# Patient Record
Sex: Female | Born: 1935 | Race: White | Hispanic: No | Marital: Married | Smoking: Never smoker
Health system: Southern US, Community
[De-identification: ages and names within clinical notes are randomized; demographics above are authoritative.]

## PROBLEM LIST (undated history)

## (undated) DIAGNOSIS — I351 Nonrheumatic aortic (valve) insufficiency: Secondary | ICD-10-CM

## (undated) DIAGNOSIS — R05 Cough: Secondary | ICD-10-CM

## (undated) DIAGNOSIS — D649 Anemia, unspecified: Secondary | ICD-10-CM

## (undated) DIAGNOSIS — G971 Other reaction to spinal and lumbar puncture: Secondary | ICD-10-CM

## (undated) DIAGNOSIS — R011 Cardiac murmur, unspecified: Secondary | ICD-10-CM

## (undated) DIAGNOSIS — C50511 Malignant neoplasm of lower-outer quadrant of right female breast: Secondary | ICD-10-CM

## (undated) DIAGNOSIS — I10 Essential (primary) hypertension: Secondary | ICD-10-CM

## (undated) DIAGNOSIS — R059 Cough, unspecified: Secondary | ICD-10-CM

## (undated) HISTORY — PX: HERNIA REPAIR: SHX51

## (undated) HISTORY — PX: BREAST SURGERY: SHX581

## (undated) HISTORY — DX: Malignant neoplasm of lower-outer quadrant of right female breast: C50.511

## (undated) HISTORY — DX: Nonrheumatic aortic (valve) insufficiency: I35.1

## (undated) HISTORY — PX: ABDOMINAL HYSTERECTOMY: SHX81

## (undated) HISTORY — PX: APPENDECTOMY: SHX54

## (undated) HISTORY — PX: TONSILLECTOMY: SUR1361

---

## 1977-12-17 HISTORY — PX: MASTECTOMY: SHX3

## 1999-11-07 ENCOUNTER — Ambulatory Visit (HOSPITAL_BASED_OUTPATIENT_CLINIC_OR_DEPARTMENT_OTHER): Admission: RE | Admit: 1999-11-07 | Discharge: 1999-11-07 | Payer: Self-pay

## 2000-01-15 ENCOUNTER — Ambulatory Visit (HOSPITAL_COMMUNITY): Admission: RE | Admit: 2000-01-15 | Discharge: 2000-01-15 | Payer: Self-pay

## 2000-01-15 ENCOUNTER — Encounter (INDEPENDENT_AMBULATORY_CARE_PROVIDER_SITE_OTHER): Payer: Self-pay | Admitting: *Deleted

## 2000-06-05 ENCOUNTER — Encounter (INDEPENDENT_AMBULATORY_CARE_PROVIDER_SITE_OTHER): Payer: Self-pay

## 2000-06-05 ENCOUNTER — Other Ambulatory Visit: Admission: RE | Admit: 2000-06-05 | Discharge: 2000-06-05 | Payer: Self-pay | Admitting: *Deleted

## 2004-06-15 ENCOUNTER — Other Ambulatory Visit: Admission: RE | Admit: 2004-06-15 | Discharge: 2004-06-15 | Payer: Self-pay | Admitting: Obstetrics and Gynecology

## 2004-08-04 ENCOUNTER — Encounter (INDEPENDENT_AMBULATORY_CARE_PROVIDER_SITE_OTHER): Payer: Self-pay | Admitting: *Deleted

## 2004-08-04 ENCOUNTER — Ambulatory Visit (HOSPITAL_COMMUNITY): Admission: RE | Admit: 2004-08-04 | Discharge: 2004-08-04 | Payer: Self-pay | Admitting: Obstetrics and Gynecology

## 2005-05-28 ENCOUNTER — Encounter (INDEPENDENT_AMBULATORY_CARE_PROVIDER_SITE_OTHER): Payer: Self-pay | Admitting: Specialist

## 2005-05-28 ENCOUNTER — Encounter (INDEPENDENT_AMBULATORY_CARE_PROVIDER_SITE_OTHER): Payer: Self-pay | Admitting: *Deleted

## 2005-05-29 ENCOUNTER — Inpatient Hospital Stay (HOSPITAL_COMMUNITY): Admission: RE | Admit: 2005-05-29 | Discharge: 2005-05-31 | Payer: Self-pay | Admitting: Obstetrics and Gynecology

## 2006-03-05 ENCOUNTER — Ambulatory Visit: Payer: Self-pay | Admitting: Internal Medicine

## 2006-03-12 ENCOUNTER — Ambulatory Visit (HOSPITAL_COMMUNITY): Admission: RE | Admit: 2006-03-12 | Discharge: 2006-03-12 | Payer: Self-pay | Admitting: Internal Medicine

## 2006-03-13 ENCOUNTER — Ambulatory Visit (HOSPITAL_COMMUNITY): Admission: RE | Admit: 2006-03-13 | Discharge: 2006-03-13 | Payer: Self-pay | Admitting: Internal Medicine

## 2006-04-02 ENCOUNTER — Ambulatory Visit: Payer: Self-pay | Admitting: Internal Medicine

## 2006-04-30 ENCOUNTER — Ambulatory Visit: Payer: Self-pay | Admitting: Internal Medicine

## 2006-10-29 ENCOUNTER — Ambulatory Visit: Payer: Self-pay | Admitting: Internal Medicine

## 2007-11-04 ENCOUNTER — Ambulatory Visit: Payer: Self-pay | Admitting: Internal Medicine

## 2008-01-01 ENCOUNTER — Ambulatory Visit: Payer: Self-pay | Admitting: Internal Medicine

## 2008-01-01 DIAGNOSIS — R059 Cough, unspecified: Secondary | ICD-10-CM | POA: Insufficient documentation

## 2008-01-01 DIAGNOSIS — R05 Cough: Secondary | ICD-10-CM

## 2008-01-02 DIAGNOSIS — J310 Chronic rhinitis: Secondary | ICD-10-CM | POA: Insufficient documentation

## 2008-01-02 DIAGNOSIS — M069 Rheumatoid arthritis, unspecified: Secondary | ICD-10-CM | POA: Insufficient documentation

## 2008-01-02 DIAGNOSIS — J449 Chronic obstructive pulmonary disease, unspecified: Secondary | ICD-10-CM

## 2008-01-02 DIAGNOSIS — J4489 Other specified chronic obstructive pulmonary disease: Secondary | ICD-10-CM | POA: Insufficient documentation

## 2008-01-02 DIAGNOSIS — J40 Bronchitis, not specified as acute or chronic: Secondary | ICD-10-CM

## 2008-04-10 ENCOUNTER — Encounter: Payer: Self-pay | Admitting: Internal Medicine

## 2008-04-19 ENCOUNTER — Ambulatory Visit: Payer: Self-pay | Admitting: Internal Medicine

## 2009-02-25 ENCOUNTER — Ambulatory Visit (HOSPITAL_COMMUNITY): Admission: RE | Admit: 2009-02-25 | Discharge: 2009-02-25 | Payer: Self-pay | Admitting: Endocrinology

## 2009-08-31 ENCOUNTER — Telehealth (INDEPENDENT_AMBULATORY_CARE_PROVIDER_SITE_OTHER): Payer: Self-pay | Admitting: *Deleted

## 2009-09-22 ENCOUNTER — Ambulatory Visit: Payer: Self-pay | Admitting: Internal Medicine

## 2009-09-23 ENCOUNTER — Telehealth: Payer: Self-pay | Admitting: Internal Medicine

## 2010-09-22 ENCOUNTER — Telehealth: Payer: Self-pay | Admitting: Internal Medicine

## 2011-01-18 NOTE — Progress Notes (Signed)
Summary: nos appt  Phone Note Call from Patient   Caller: [email or web address]  Call For: young Summary of Call: LMTCB x2 to rsc nos from 10/6. Initial call taken by: Darletta Moll,  September 22, 2010 3:54 PM

## 2011-02-28 ENCOUNTER — Ambulatory Visit (HOSPITAL_COMMUNITY): Payer: Medicare Other | Attending: Endocrinology

## 2011-02-28 DIAGNOSIS — M810 Age-related osteoporosis without current pathological fracture: Secondary | ICD-10-CM | POA: Insufficient documentation

## 2011-05-01 NOTE — Assessment & Plan Note (Signed)
[facility]                             PULMONARY OFFICE NOTE   Sarah Lewis, Sarah Lewis                       MRN:          [id number]  DATE:11/04/2007                            DOB:          10-19-1936    PROBLEM LIST:  1. Chronic obstructive pulmonary disease.  2. History of mastectomy.  3. Cough.   HISTORY:  She says breathing has been very good.  She is using Tussionex  1/2 teaspoon once a day most days.  Spiriva did not help.  Tussionex  does stop her chronic cough which is nonseasonal.  She thinks the  primary problem is runny nose with watery rhinorrhea and that this is  the cause for her cough.  Throat lozenge is occasionally helpful.  Claritin and Zyrtec did not help either her nose or her cough.  She  never coughs at night, denies any reflux symptoms.  Nothing purulent or  bloody.  No headache.  She has had flu vaccine.   MEDICATIONS:  1. Forteo.  2. Norvasc.  3. Tussionex.   ALLERGIES:  No known drug allergies.   OBJECTIVE:  VITAL SIGNS:  Weight 101 pounds, blood pressure 142/80,  pulse 87, room air saturation 96%.  HEENT:  Nasal airway looks clear.  Pharynx clear.  CHEST:  Clear.  No cough at all while she was with me today.  HEART:  Sounds regular without murmur.   IMPRESSION:  Cough she relates to watery rhinorrhea.  I think there is a  background component of asthma/chronic obstructive pulmonary disease as  well, but pulmonary function tests previously had shown only mild  obstruction with some evidence of air trapping and moderately reduced  diffusion.   PLAN:  1. We refilled Tussionex with discussion.  2. Try Patanase nasal spray once each nostril b.i.d. p.r.n.  3. Return for allergy testing, which she requests.    Clinton D. Maple Hudson, MD, Tonny Bollman, FACP  Electronically Signed   CDY/MedQ  DD: 11/05/2007  DT: 11/06/2007  Job #: [id number]   cc:   Dario Guardian, M.D.

## 2011-05-04 NOTE — Op Note (Signed)
NAME:  Sarah Lewis, Sarah Lewis              ACCOUNT NO.:  [id number]   MEDICAL RECORD NO.:  [id number]          PATIENT TYPE:  AMB   LOCATION:  SDC                           FACILITY:  [facility]   PHYSICIAN:  Guy Sandifer. Henderson Cloud, M.D. DATE OF BIRTH:  21-Jul-1936   DATE OF PROCEDURE:  05/28/2005  DATE OF DISCHARGE:                                 OPERATIVE REPORT   PREOPERATIVE DIAGNOSES:  1.  Endometrial hyperplasia.  2.  Uterine leiomyomata.   POSTOPERATIVE DIAGNOSES:  1.  Endometrial  hyperplasia.  2.  Left ovarian tumor.   PROCEDURES:  1.  Total abdominal hysterectomy.  2.  Bilateral salpingo-oophorectomy.  3.  Laparoscopy.   SURGEON:  Guy Sandifer. Henderson Cloud, M.D.   ASSISTANTFreddy Finner, M.D.   ESTIMATED BLOOD LOSS:  100 mL.   ANESTHESIA:  General with endotracheal intubation.   SPECIMENS:  1.  Uterus and bilateral tubes and ovaries.  2.  Peritoneal washings for cytology.   INDICATIONS AND CONSENT:  This patient is a 75 year old, married, white  female, G2, P2, with a history of breast cancer, who was found to have  endometrial hyperplasia without atypia on hysteroscopy and D&C.  After  discussion, the patient wishes to proceed to hysterectomy.  The details are  dictated in the history and physical.  The patient has also been noted to  have a pedunculated uterine leiomyoma measuring approximately 7 cm on  ultrasound.  Laparoscopically assisted vaginal hysterectomy has been  discussed with the patient preoperatively.  Removal of both ovaries as well.  The potential risks and complications have been discussed preoperatively,  including, but not limited to infection, bowel, bladder and ureteral damage,  bleeding requiring transfusion of blood products, possible transfusion  reaction, HIV and hepatitis acquisition, DVT, PE, pneumonia, laparotomy and  fistula formation.  All questions are answered and consent is signed on the  chart.   FINDINGS:  At laparoscopy, the upper abdomen is  without lesion.  There is no  studding or adhesions noted throughout the abdominal cavity.  In the pelvis,  the uterus is normal size.  The anterior and posterior cul-de-sacs are  normal.  The right tube and ovary are normal.  The left ovary has a firm,  smooth, totally mobile, white mass approximately 7 cm in greatest dimension.   DESCRIPTION OF PROCEDURE:  The patient was taken to the operating room where  she was identified, placed in the supine position and general anesthesia was  induced via endotracheal intubation.  She was then placed in the  dorsolithotomy position where she was prepped abdominally and vaginally.  The bladder was straight catheterized.  A Hulka tenaculum in the uterus as a  manipulation.  She was draped in a sterile fashion.  A small infraumbilical  incision was made by elevating the skin with Allis clamps.  A disposable  Veress was then placed on the first attempt with normal syringe and drop  test.  Two liters of gas were insufflated under low pressure.  Good tympani  in the right upper quadrant was noted.  The Veress was removed and was  replaced with a disposable, nonbladed 10/11 trocar sleeve.  Inspection  revealed the above findings.  The hysteroscope was withdrawn.  The fascia of  the umbilical incision was closed with a single 3-0 Vicryl suture.  Care was  taken not to pick up any underlying structures.  The umbilical incision was  closed with Dermabond.  The Hulka was removed and a Foley catheter was  placed in the bladder.  The skin was then entered through a Pfannenstiel  incision and dissection was carried out in layers to the peritoneum.  The  peritoneum was incised and extended superiorly and inferiorly.  Washings  were then taken of the peritoneal cavity and sent for cytology.  A Lendell Caprice-  O'Connor retractor was then placed.  The sides of the retractor are padded  with towels to prevent any pressure problems from the blades.  A bladder  blade was  placed, bowel was packed away and a superior blade was placed.  Further inspection confirmed the above findings.  Kelly clamps were placed  in the proximal ligaments on the uterus bilaterally.  The left ovary was  then elevated.  The ureter could be seen well clear of the area of surgery.  The left infundibular pelvic ligament was then clamped and taken down.  The  utero-ovarian ligament was then taken down as well.  The specimen was sent  to pathology for frozen section.  The infundibular pelvic pedicle was then  ligated first with a free tie and then with a suture of 0 Monocryl.  All  sutures will be 0 Monocryl unless otherwise designated.  Progressive bites  were taken of the left broad ligament down to the level of the vesicouterine  peritoneum.  The vesicouterine peritoneum was then taken down sharply and  the bladder flap was advanced.  The left infundibular pelvic ligament was  then isolated.  The ureter seemed to be well clear of the clamp.  It was  then taken down and doubly ligated with a free tie and then with a suture.  This was then taken across the round ligament and down the broad ligament as  well.  The bladder was then safely advanced sharply and bluntly.  Progressive bites of the cardinal ligaments were taken with straight clamps.  Curved clamps were then used to take down the uterosacral ligament near the  vagina bilaterally.  The specimen was cut free.  The entire cervix was seen  to be removed.  The pedicles were ligated with transfixion sutures.  The  cuff was then closed with figure-of-eights.  The uterosacral ligaments were  then plicated in the midline with a single suture placed distal on the  uterosacral ligaments.  Bleeding was controlled at the peritoneal edges with  cautery.  Copious washing was carried out.  Pathology returned consistent with a benign fibroma or possible fibrothecoma.  The anterior peritoneum was  then closed in a running fashion with 0  Monocryl suture, which was also used  to reapproximate the pyramidalis muscle in the midline.  The anterior rectus  fascia was closed with 0 PDS suture starting at each angle and meeting in  the middle.  The skin was closed with clips.  All sponge, needle and  instrument counts were correct.  The patient was transferred to the recovery  room in stable condition.       JET/MEDQ  D:  05/28/2005  T:  05/28/2005  Job:  [id number]

## 2011-05-04 NOTE — Discharge Summary (Signed)
NAME:  Sarah Lewis, Sarah Lewis              ACCOUNT NO.:  [id number]   MEDICAL RECORD NO.:  [id number]          PATIENT TYPE:  INP   LOCATION:  9310                          FACILITY:  [facility]   PHYSICIAN:  Guy Sandifer. Henderson Cloud, M.D. DATE OF BIRTH:  1936-02-16   DATE OF ADMISSION:  05/28/2005  DATE OF DISCHARGE:                                 DISCHARGE SUMMARY   ADMITTING DIAGNOSES:  1.  Endometrial hyperplasia.  2.  Leiomyomata.   DISCHARGE DIAGNOSES:  1.  Endometrial hyperplasia.  2.  Left ovarian tumor.   PROCEDURE:  On May 28, 2005 0 total abdominal hysterectomy with bilateral  salpingo-oophorectomy and laparoscopy.   REASON FOR ADMISSION:  This patient is a 75 year old married white female G2  P2 with a history of breast cancer who has endometrial hyperplasia without  atypia. She also has a history of uterine leiomyomata. She is being admitted  for surgical management. Details are dictated in the history and physical.   HOSPITAL COURSE:  The patient was admitted to hospital, goes to the  operating room for the above procedure. At laparoscopy what was felt to be a  pedunculated fibroid preoperatively is seen to be a firm left ovarian tumor  approximately 7-8 cm in diameter. Her procedure is then converted to total  abdominal hysterectomy with bilateral salpingo-oophorectomy. Frozen section  was consistent with a benign tumor, probable fibroma. On the evening of  surgery she has some nausea controlled with medication and good pain relief.  Vital signs are stable, she is afebrile, with clear urine output. On the  first postoperative day she has had a little bit of nausea off and on and no  flatus. She has good pain relief. Vital signs remained stable and she is  afebrile with clear urine output. Abdomen is soft with positive but  hypoactive bowel sounds. Hemoglobin is 8.2 and white count 10.6. She is  ambulating well. On the second postoperative day she is passing flatus and  tolerated a  regular diet last evening for supper. She has a little bit of  nausea but no vomiting. She is a little weak and dizzy on her feet and  requires assistance with ambulation. Vital signs revealed stable blood  pressures in the 110s over 70s. Pulse at rest is in the 90s and regular.  Abdomen is soft with positive bowel sounds and the incision is okay.  Hemoglobin is 6.4; white count 9.8; platelets 259,000. Although she is  hemodynamically stable at rest with her postoperative anemia she is  symptomatic. Considering the patient's symptoms and age, transfusion is  discussed with the patient. The risks and benefits of transfusion are  reviewed and the patient is transfused 3 units of packed red blood cells.  That same evening she is feeling better in the middle of unit #2 of blood.  She is sitting up in the chair eating. She remains afebrile. Vital signs are  stable. Ancef is resumed IV for prophylaxis. On the day of discharge,  hemoglobin is 10.1 at 6:00 a.m. and is 11.8 at 12 p.m. Her color was good,  she is feeling well,  and is ambulating. Incision continues to heal well.  Staples are removed and instructions are given. Pathology is pending at time  of dictation.   CONDITION ON DISCHARGE:  Good.   DIET:  Regular as tolerated.   ACTIVITY:  No lifting, no operation of automobiles, no vaginal entry. She is  to call the office problems including but not limited to heavy vaginal  bleeding, temperature of 101 degrees, increasing pain, or persistent  nausea/vomiting.   MEDICATIONS:  1.  Ceftin 250 mg p.o. b.i.d. #14.  2.  Darvocet N 100 #31 one to two p.o. q.6h. p.r.n.  3.  Multivitamin daily.  4.  She will resume her preoperative medications that she took at home.   Follow-up is in the office in 2 weeks.       JET/MEDQ  D:  05/31/2005  T:  05/31/2005  Job:  [id number]

## 2011-05-04 NOTE — Op Note (Signed)
[place]. [facility]  Patient:    Sarah Lewis                        MRN: [id number] Proc. Date: 01/15/00 Adm. Date:  [id number] Disc. Date: [id number] Attending:  Gennie Alma CC:         Dario Guardian, M.D.                           Operative Report  PREOPERATIVE DIAGNOSIS:  Mass of right breast.  POSTOPERATIVE DIAGNOSIS:  Mass of right breast.  PROCEDURE:  Right breast biopsy.  SURGEON:  Milus Mallick, M.D.  ANESTHESIA:  Local infiltration with 1% Xylocaine - 25 cc and monitored anesthesia care.  DESCRIPTION OF PROCEDURE:  Under adequate perioperative intravenous sedation, the patients right breast was prepared and draped in usual fashion.  There was a palpable nodule at the 11 oclock radial of the right breast just at the edge of  the areolar.  The area in question was infiltrated with 1% Xylocaine.  A periareolar incision was made and carried down to the lesion.  Was totally excised. Bleeders were electrocoagulated and one incidence, a significant arterial bleeder was suture ligated with 3-0 chromic catgut.  The specimen was sent for routine pathologic examination.  Hemostasis was ascertained.  The subcuticular layer was reapproximated with a continuous suture of 5-0 Vicryl, half inch Steri-Strips were applied to the skin, sterile dressing was applied.  Estimated blood loss for the procedure was approximately 100 cc.  The patient was very oozy due to her chronic ingestion of aspirins.  A good deal of electrocoagulation was used to secure hemostasis.  The patient tolerated the procedure well and left the operating room in satisfactory condition. DD:  01/15/00 TD:  01/15/00 Job: [id number]

## 2011-05-04 NOTE — Op Note (Signed)
[place]. [facility]  Patient:    SAMUELLA RASOOL                        MRN: [id number] Proc. Date: 11/07/99 Adm. Date:  [id number] Attending:  Gennie Alma CC:         Dario Guardian, M.D., [facility]                           Operative Report  PREOPERATIVE DIAGNOSIS:  Chronically incarcerated right femoral hernia.  POSTOPERATIVE DIAGNOSES: 1. Chronically incarcerated right femoral hernia. 2. Enlarged right superficial inguinal lymph node.  PROCEDURES: 1. Repair of right femoral hernia with mesh. 2. Excision of right superficial inguinal lymph node.  SURGEON:  Milus Mallick, M.D.  ANESTHESIA:  General.  DESCRIPTION OF PROCEDURE:  Under adequate general anesthesia, the patients right inguinal and femoral regions were prepared and draped in usual fashion.  An oblique incision was made in the inguinal crease overlying the femoral canal.  There was a palpable mass in the area consistent with a chronically incarcerated right femoral hernia.  Prior to making the incision, area was infiltrated with 1% Xylocaine with 1:200,000 parts of epinephrine, 8 cc.  The incision was made and carried down through Scarpas fascia.  Two large veins were divided over hemostats, which were ligated with 4-0 Vicryl.  Femoral canal was exposed and there was a chronically  incarcerated right femoral hernia.  It was dissected away from the surrounding tissue and was able to be reduced after taking down all of the adhesions associated with it.  It was reduced back into the preperitoneal space.  The defect in the femoral canal admitted a full index finger.  It was repaired with an ______ mesh plug, large in size.  It was held in place with two sutures of 0 Surgilon, one o the inguinal ligament and the other to Coopers ligament.  The mesh was securely  held in place by these few sutures.  In addition to this pathology, there was also a markedly  enlarged right superficial inguinal lymph node.  Because of the patients past history of cancer of the breast, it was felt to be prudent to excise the enlarged lymph node.  This was one over hemostats, which were ligated with 4-0 Vicryl.  The lymph node was approximately 3 cm in length and 2 cm in diameter.  It was sent for routine pathologic study.  Hemostasis is ascertained.  Scarpas fascia was closed with continuous suture of  3-0 Vicryl and the subcuticular layer was reapproximated with a continuous suture of 5-0 Vicryl.  Half-inch Steri-Strips were applied to the skin and sterile dressing was applied.  Estimated blood loss for the procedure was negligible. Patient tolerated the procedure well, left the operating room in satisfactory condition. DD:  11/07/99 TD:  11/08/99 Job: [id number]

## 2011-05-04 NOTE — H&P (Signed)
NAME:  Sarah Lewis, Sarah Lewis              ACCOUNT NO.:  [id number]   MEDICAL RECORD NO.:  [id number]          PATIENT TYPE:  AMB   LOCATION:  SDC                           [facility]ILITY[facility]   PHYSICIAN:  Guy Sandifer. Henderson Cloud, M.D. DATE OF BIRTH:  December 26, 1935   DATE OF ADMISSION:  05/28/2005  DATE OF DISCHARGE:                                HISTORY & PHYSICAL   CHIEF COMPLAINT:  Endometrial hyperplasia.   HISTORY OF PRESENT ILLNESS:  This patient is a 75 year old, married, white  female, G2, P2, who has had breast cancer, who most recently was diagnosed  with endometrial hyperplasia without atypia in both an endometrial polyp as  well as throughout the endometrium upon hysteroscopy D&C in August 2005.  After discussion of the options, consultation was undertaken with  oncologist, Dr. Ruthann Cancer.  He agreed with hysterectomy.  He also  agreed with BSO.  Endometrial biopsy, in January 2006, was benign.  After  discussion of the options, she is being admitted for laparoscopically  assisted vaginal hysterectomy and bilateral salpingo-oophorectomy.  Ultrasound in my office, in July 2005, revealed a pedunculated 7-cm fundal  fibroid.   PAST MEDICAL HISTORY:  1.  Chronic hypertension.  2.  Breast cancer, status post mastectomy.  3.  Osteopenia.   PAST SURGICAL HISTORY:  1.  Mastectomy.  2.  Hysteroscopy D&C as above.  3.  Right inguinal hernia surgery.  4.  Ovarian cystectomy in 1969.  5.  Benign right breast lumpectomy, 2001.   FAMILY HISTORY:  Negative.   OBSTETRIC HISTORY:  Cesarean section x 2.   MEDICATIONS:  Lisinopril daily, Fosamax daily.   No known drug allergies.   SOCIAL HISTORY:  Denies tobacco, alcohol, or drug abuse.   REVIEW OF SYSTEMS:  NEURO:  Denies headache.  CARDIAC:  Denies chest pain.  PULMONARY:  Denies shortness of breath.  GI:  Denies recent changes in bowel  habits.   PHYSICAL EXAMINATION:  VITAL SIGNS:  Blood pressure 126/74.  HEENT:  Without  thyromegaly.  LUNGS:  Clear to auscultation.  HEART:  Regular rate and rhythm.  BACK:  Without CVA tenderness.  BREASTS:  Not examined.  ABDOMEN:  Soft, nontender with a midline mass just palpable above the pubic  symphysis.  PELVIC:  Vulva, vagina, and cervix slight atrophic without lesion.  Uterus  is mobile with a 7- to -8-cm fibroid noted.  Adnexa nontender without  masses.  EXTREMITIES:  Grossly within normal limits.  NEUROLOGIC:  Grossly within normal limits.   ASSESSMENT:  1.  Endometrial hyperplasia.  2.  Uterine leiomyomata.   PLAN:  Laparoscopically-assisted vaginal hysterectomy with bilateral  salpingo-oophorectomy.       JET/MEDQ  D:  05/23/2005  T:  05/23/2005  Job:  [id number]

## 2011-05-04 NOTE — Assessment & Plan Note (Signed)
[facility]                               PULMONARY OFFICE NOTE   Sarah Lewis, Sarah Lewis                     MRN:          [id number]  DATE:10/29/2006                            DOB:          06-28-1936    PROBLEM LIST:  1. Cough.  2. Chronic obstructive pulmonary disease.  3. History of mastectomy.   HISTORY:  Six month followup. She has recently had a cold which she feels  she is getting over. Benzonatate perles have not turned out to be so much  help for her cough but she still considers Spiriva to be a very big benefit.  She denies post nasal drip, wheeze or reflux. Cough varies with no real  pattern. We discussed available cough medicines. She does not want anything  with a narcotic. There has been nothing purulent, no pain and no sudden  events. 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 Has  had flu shot this fall.   MEDICATIONS:  Norvasc, Fosamax, Spiriva, benzonatate perles.   No medication allergies.   OBJECTIVE:  VITAL SIGNS:  Weight 101 pounds, BP 120/62, pulse regular 79,  room air saturation 96%.  CHEST:  Clear now with no wheeze and no cough even on deep breath or forced  exhalation.  HEART:  Sounds are regular and normal.  LYMPH:  No lymphadenopathy.  HEENT:  There is minimal nasal congestion.   IMPRESSION:  1. Incidental recent upper respiratory infection, resolving.  2. Chronic bronchitis is probably the main basis for her cough with chest      x-ray evidence of some chronic obstructive pulmonary disease,      hyperinflation and changes of emphysema in this never smoker. Alpha 180      trypsin level was normal and pulmonary function test had shown mild      obstructive airways change with minimal response to dilator. Diffusion      was moderately reduced at 64% of predicted. She has no close family      members with lung disease.   PLAN:  1. Continue Spiriva.  2. She asked to be followed at long  intervals here and we will see her      again in a year unless      needed sooner.  3. Consider adding additional bronchodilator therapy later if necessary.     Clinton D. Maple Hudson, MD, Tonny Bollman, FACP  Electronically Signed    CDY/MedQ  DD: 10/29/2006  DT: 10/30/2006  Job #: [id number]   cc:   Dario Guardian, M.D.

## 2011-05-04 NOTE — H&P (Signed)
NAME:  Sarah Lewis, Sarah Lewis                        ACCOUNT NO.:  [id number]   MEDICAL RECORD NO.:  [id number]                   PATIENT TYPE:  AMB   LOCATION:  SDC                                  FACILITY:  [facility]   PHYSICIAN:  Guy Sandifer. Arleta Creek, M.D.           DATE OF BIRTH:  [birth year]   DATE OF ADMISSION:  08/04/2004  DATE OF DISCHARGE:                                HISTORY & PHYSICAL   CHIEF COMPLAINT:  Postmenopausal bleeding.   HISTORY OF PRESENT ILLNESS:  This patient is a 75 year old G2, P2, who had a  large prolapsed cervical polyp removed in 2001.  She then presents in June  of this year with her first episode of postmenopausal bleeding since that  time.  She has a known pedunculated fibroid.  Ultrasound with sono-  hysterogram on July 07, 2004 reveals a 7.1 cm pedunculated fundal fibroid  which is  unchanged.  Hysterogram reveals 2 polypoid type endometrial masses measuring  19 and 11 mm respectively.  She is being admitted for hysteroscopy, D&C.  Potential risks, complications have been discussed with the patient  preoperatively.   PAST MEDICAL HISTORY:  1. Osteoporosis.  2. Chronic hypertension.  3. Left breast cancer status post left mastectomy and chemotherapy in 1978.   SURGICAL HISTORY:  Mastectomy as above; hernia surgery; ovarian cyst removal  in the 1960's; and benign breast lumpectomies on the right breast.   MEDICATIONS:  Fosamax and Lisinopril.   ALLERGIES:  No known drug allergies.   SOCIAL HISTORY:  The patient denies tobacco, alcohol or drug abuse.   FAMILY HISTORY:  Breast cancer in sister.   REVIEW OF SYSTEMS:  NEURO:  Denies headache.  CARDIO:  Denies chest pain.  PULMONARY:  Denies shortness of breath.   PHYSICAL EXAMINATION:  LUNGS:  Clear to auscultation.  HEART:  Regular rate and rhythm.  BREASTS:  Not examined.  ABDOMEN:  Soft, nontender, palpable midline mass just above the symphysis  pubis.  PELVIC EXAM:  Vulva, vagina and cervix without  lesion.  A 7 cm anterior  fibroid is noted.  Adnexal exam compromised by fibroid.  EXTREMITIES/NEUROLOGIC EXAM:  Grossly within normal limits.   ASSESSMENT:  Postmenopausal bleeding.   PLAN:  Hysteroscopy, dilation and curettage.                                               Guy Sandifer Arleta Creek, M.D.    JET/MEDQ  D:  08/03/2004  T:  08/03/2004  Job:  [id number]

## 2011-05-04 NOTE — Op Note (Signed)
NAME:  Sarah Lewis, Sarah Lewis                        ACCOUNT NO.:  [id number]   MEDICAL RECORD NO.:  [id number]                   PATIENT TYPE:  AMB   LOCATION:  SDC                                  FACILITY:  [facility]   PHYSICIAN:  Guy Sandifer. Arleta Creek, M.D.           DATE OF BIRTH:  06-09-1936   DATE OF PROCEDURE:  08/04/2004  DATE OF DISCHARGE:                                 OPERATIVE REPORT   PREOPERATIVE DIAGNOSES:  Postmenopausal bleeding.   POSTOPERATIVE DIAGNOSES:  Endometrial polyps.   PROCEDURE:  1. Hysteroscopy with resection of endometrial polyps.  2. Dilatation and curettage.  3. One percent Xylocaine paracervical block.   ESTIMATED BLOOD LOSS:  Less than 50 mL.   I & O'S AND SORBITOL DISTENDING MEDIA:  20 mL deficit.   SPECIMENS:  1. Endometrial polyps.  2. Endometrial curettings.   INDICATIONS FOR PROCEDURE:  The patient is a 75 year old, G2, P2, with  postmenopausal bleeding. Details were dictated in the history and physical.  Hysteroscopy with dilatation and curettage has been discussed with the  patient preoperatively.  The potential risks and complications were  discussed preoperatively including but not limited to infection, uterine  perforation, bowel, bladder, ureteral damage, bleeding requiring  transfusion of blood products and possible transfusion reaction.  HIV and  hepatitis acquisition, DVT, PE, pneumonia, laparoscopy, laparotomy,  recurrent polyps or endometrial bleeding. All questions are answered and  consent is signed on the chart.   FINDINGS:  There are two endometrial polyps measuring 2-3 cm arising from  the upper posterior wall and the upper fundus of the endometrial canal.   DESCRIPTION OF PROCEDURE:  The patient was taken to the operating room where  she was identified, placed in dorsal supine position and intravenous  anesthetic is administered.  She is then placed in the dorsal lithotomy  position and prepped abdominally and vaginally. The  bladder is straight  catheterized. She is draped in a sterile fashion. A bivalve speculum was  placed in the vagina and anterior cervical lip is injected with 1% plain  Xylocaine. This was then grasped with a single tooth tenaculum.  Paracervical block is placed at the 2, 4, 5, 7, 8 and 10 o'clock positions  with approximately 20 mL total of 1% Xylocaine plain.  The cervix is  progressively and gently dilated with a 33 Pratt dilator.  The operative  hysteroscope is placed in the endocervical canal and advanced under direct  visualization using sorbitol distending media. The above findings are noted.  The polyps are removed without difficulty. Sharp curettage is then carried  out.  All instruments are removed, all counts are correct.  The patient is  awakened, taken to the recovery room in stable condition.  Guy Sandifer Arleta Creek, M.D.    JET/MEDQ  D:  08/04/2004  T:  08/05/2004  Job:  [id number]

## 2012-03-03 ENCOUNTER — Encounter (HOSPITAL_COMMUNITY): Payer: Medicare Other

## 2012-03-05 ENCOUNTER — Other Ambulatory Visit (HOSPITAL_COMMUNITY): Payer: Self-pay | Admitting: *Deleted

## 2012-03-10 ENCOUNTER — Encounter (HOSPITAL_COMMUNITY): Payer: Self-pay

## 2012-03-10 ENCOUNTER — Encounter (HOSPITAL_COMMUNITY)
Admission: RE | Admit: 2012-03-10 | Discharge: 2012-03-10 | Disposition: A | Discharge status: Home / Self Care | Payer: Medicare Other | Source: Ambulatory Visit | Attending: Endocrinology | Admitting: Endocrinology

## 2012-03-10 DIAGNOSIS — M810 Age-related osteoporosis without current pathological fracture: Secondary | ICD-10-CM | POA: Insufficient documentation

## 2012-03-10 HISTORY — DX: Cough, unspecified: R05.9

## 2012-03-10 HISTORY — DX: Cough: R05

## 2012-03-10 HISTORY — DX: Essential (primary) hypertension: I10

## 2012-03-10 MED ORDER — ZOLEDRONIC ACID 5 MG/100ML IV SOLN
5.0000 mg | Freq: Once | INTRAVENOUS | Status: AC
  Administered 2012-03-10: 5 mg via INTRAVENOUS
  Filled 2012-03-10: qty 100

## 2012-03-10 MED ORDER — SODIUM CHLORIDE 0.9 % IV SOLN
INTRAVENOUS | Status: AC
  Administered 2012-03-10: 250 mL via INTRAVENOUS

## 2012-03-10 NOTE — Discharge Instructions (Signed)
Drink fluids/water as tolerated over next 72hrs Tylenol or Ibuprofen OTC as directed Continue calcium and Vit D as directed by your MD 

## 2013-03-02 ENCOUNTER — Other Ambulatory Visit (HOSPITAL_COMMUNITY): Payer: Self-pay | Admitting: *Deleted

## 2013-03-03 ENCOUNTER — Encounter (HOSPITAL_COMMUNITY)
Admission: RE | Admit: 2013-03-03 | Discharge: 2013-03-03 | Disposition: A | Discharge status: Home / Self Care | Payer: Medicare PPO | Source: Ambulatory Visit | Attending: Endocrinology | Admitting: Endocrinology

## 2013-03-03 DIAGNOSIS — M810 Age-related osteoporosis without current pathological fracture: Secondary | ICD-10-CM | POA: Insufficient documentation

## 2013-03-03 MED ORDER — ZOLEDRONIC ACID 5 MG/100ML IV SOLN
5.0000 mg | Freq: Once | INTRAVENOUS | Status: AC
  Administered 2013-03-03: 5 mg via INTRAVENOUS

## 2013-03-03 MED ORDER — ZOLEDRONIC ACID 5 MG/100ML IV SOLN
INTRAVENOUS | Status: AC
  Administered 2013-03-03: 5 mg via INTRAVENOUS
  Filled 2013-03-03: qty 100

## 2013-09-23 ENCOUNTER — Other Ambulatory Visit (HOSPITAL_COMMUNITY): Payer: Self-pay | Admitting: Interventional Cardiology

## 2013-09-23 DIAGNOSIS — R011 Cardiac murmur, unspecified: Secondary | ICD-10-CM

## 2013-09-28 ENCOUNTER — Ambulatory Visit (HOSPITAL_COMMUNITY): Payer: Medicare PPO | Attending: Cardiology

## 2013-09-28 DIAGNOSIS — R011 Cardiac murmur, unspecified: Secondary | ICD-10-CM | POA: Insufficient documentation

## 2013-09-28 DIAGNOSIS — R229 Localized swelling, mass and lump, unspecified: Secondary | ICD-10-CM

## 2013-09-28 NOTE — Progress Notes (Signed)
Echocardiogram performed.  

## 2014-03-09 ENCOUNTER — Other Ambulatory Visit (HOSPITAL_COMMUNITY): Payer: Self-pay | Admitting: *Deleted

## 2014-03-10 ENCOUNTER — Ambulatory Visit (HOSPITAL_COMMUNITY)
Admission: RE | Admit: 2014-03-10 | Discharge: 2014-03-10 | Disposition: A | Discharge status: Home / Self Care | Payer: Medicare PPO | Source: Ambulatory Visit | Attending: Endocrinology | Admitting: Endocrinology

## 2014-03-10 DIAGNOSIS — M810 Age-related osteoporosis without current pathological fracture: Secondary | ICD-10-CM | POA: Insufficient documentation

## 2014-03-10 MED ORDER — ZOLEDRONIC ACID 5 MG/100ML IV SOLN
5.0000 mg | Freq: Once | INTRAVENOUS | Status: AC
  Administered 2014-03-10: 5 mg via INTRAVENOUS

## 2014-03-10 MED ORDER — ZOLEDRONIC ACID 5 MG/100ML IV SOLN
INTRAVENOUS | Status: AC
  Administered 2014-03-10: 5 mg via INTRAVENOUS
  Filled 2014-03-10: qty 100

## 2015-10-06 ENCOUNTER — Other Ambulatory Visit: Payer: Self-pay | Admitting: Radiology

## 2015-10-10 ENCOUNTER — Telehealth: Payer: Self-pay | Admitting: *Deleted

## 2015-10-10 ENCOUNTER — Encounter: Payer: Self-pay | Admitting: *Deleted

## 2015-10-10 DIAGNOSIS — Z170 Estrogen receptor positive status [ER+]: Secondary | ICD-10-CM

## 2015-10-10 DIAGNOSIS — C50511 Malignant neoplasm of lower-outer quadrant of right female breast: Secondary | ICD-10-CM

## 2015-10-10 HISTORY — DX: Malignant neoplasm of lower-outer quadrant of right female breast: C50.511

## 2015-10-10 NOTE — Telephone Encounter (Signed)
Confirmed [facility] for 10/12/15 at 1230 .  Instructions and contact information given.

## 2015-10-12 ENCOUNTER — Other Ambulatory Visit: Payer: Self-pay | Admitting: Family Medicine

## 2015-10-12 ENCOUNTER — Encounter: Payer: Self-pay | Admitting: Physical Therapy

## 2015-10-12 ENCOUNTER — Ambulatory Visit
Admission: RE | Admit: 2015-10-12 | Discharge: 2015-10-12 | Disposition: A | Discharge status: Home / Self Care | Payer: PRIVATE HEALTH INSURANCE | Source: Ambulatory Visit | Attending: Family Medicine | Admitting: Family Medicine

## 2015-10-12 ENCOUNTER — Encounter: Payer: Self-pay | Admitting: Nurse Practitioner

## 2015-10-12 ENCOUNTER — Ambulatory Visit (HOSPITAL_BASED_OUTPATIENT_CLINIC_OR_DEPARTMENT_OTHER): Payer: Medicare Other | Admitting: Oncology

## 2015-10-12 ENCOUNTER — Other Ambulatory Visit (HOSPITAL_BASED_OUTPATIENT_CLINIC_OR_DEPARTMENT_OTHER): Payer: Medicare Other

## 2015-10-12 ENCOUNTER — Ambulatory Visit: Payer: Medicare Other | Attending: General Surgery | Admitting: Physical Therapy

## 2015-10-12 ENCOUNTER — Encounter: Payer: Self-pay | Admitting: Oncology

## 2015-10-12 ENCOUNTER — Ambulatory Visit
Admission: RE | Admit: 2015-10-12 | Discharge: 2015-10-12 | Disposition: A | Discharge status: Home / Self Care | Payer: Medicare PPO | Source: Ambulatory Visit | Attending: Radiation Oncology | Admitting: Radiation Oncology

## 2015-10-12 VITALS — BP 178/81 | HR 71 | Temp 98.7°F | Resp 18 | Ht 61.0 in | Wt 90.4 lb

## 2015-10-12 DIAGNOSIS — C50511 Malignant neoplasm of lower-outer quadrant of right female breast: Secondary | ICD-10-CM

## 2015-10-12 DIAGNOSIS — Z170 Estrogen receptor positive status [ER+]: Secondary | ICD-10-CM | POA: Diagnosis not present

## 2015-10-12 DIAGNOSIS — R293 Abnormal posture: Secondary | ICD-10-CM | POA: Diagnosis present

## 2015-10-12 DIAGNOSIS — R0989 Other specified symptoms and signs involving the circulatory and respiratory systems: Secondary | ICD-10-CM

## 2015-10-12 DIAGNOSIS — C50811 Malignant neoplasm of overlapping sites of right female breast: Secondary | ICD-10-CM | POA: Diagnosis not present

## 2015-10-12 DIAGNOSIS — M810 Age-related osteoporosis without current pathological fracture: Secondary | ICD-10-CM | POA: Diagnosis not present

## 2015-10-12 LAB — CBC WITH DIFFERENTIAL/PLATELET
BASO%: 1 % (ref 0.0–2.0)
Basophils Absolute: 0.1 10*3/uL (ref 0.0–0.1)
EOS ABS: 0.1 10*3/uL (ref 0.0–0.5)
EOS%: 2.2 % (ref 0.0–7.0)
HCT: 42 % (ref 34.8–46.6)
HGB: 14.2 g/dL (ref 11.6–15.9)
LYMPH%: 24.9 % (ref 14.0–49.7)
MCH: 30.3 pg (ref 25.1–34.0)
MCHC: 33.7 g/dL (ref 31.5–36.0)
MCV: 90 fL (ref 79.5–101.0)
MONO#: 0.4 10*3/uL (ref 0.1–0.9)
MONO%: 7.1 % (ref 0.0–14.0)
NEUT#: 4 10*3/uL (ref 1.5–6.5)
NEUT%: 64.8 % (ref 38.4–76.8)
PLATELETS: 268 10*3/uL (ref 145–400)
RBC: 4.66 10*6/uL (ref 3.70–5.45)
RDW: 13 % (ref 11.2–14.5)
WBC: 6.2 10*3/uL (ref 3.9–10.3)
lymph#: 1.5 10*3/uL (ref 0.9–3.3)

## 2015-10-12 LAB — COMPREHENSIVE METABOLIC PANEL (CC13)
ALT: 17 U/L (ref 0–55)
ANION GAP: 8 meq/L (ref 3–11)
AST: 21 U/L (ref 5–34)
Albumin: 4.2 g/dL (ref 3.5–5.0)
Alkaline Phosphatase: 65 U/L (ref 40–150)
BILIRUBIN TOTAL: 0.55 mg/dL (ref 0.20–1.20)
BUN: 13.9 mg/dL (ref 7.0–26.0)
CHLORIDE: 104 meq/L (ref 98–109)
CO2: 28 meq/L (ref 22–29)
Calcium: 9.6 mg/dL (ref 8.4–10.4)
Creatinine: 0.8 mg/dL (ref 0.6–1.1)
EGFR: 71 mL/min/{1.73_m2} — AB (ref >90)
Glucose: 92 mg/dl (ref 70–140)
Potassium: 4.1 mEq/L (ref 3.5–5.1)
Sodium: 140 mEq/L (ref 136–145)
TOTAL PROTEIN: 7.2 g/dL (ref 6.4–8.3)

## 2015-10-12 MED ORDER — TAMOXIFEN CITRATE 20 MG PO TABS: 20.0000 mg | ORAL_TABLET | Freq: Every day | ORAL | Status: DC

## 2015-10-12 NOTE — Progress Notes (Signed)
Sarah Lewis is a very pleasant 79 y.o. 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  Biopsy results revealed the tumor's prognostic profile is ER positive, PR positive, and HER2/neu negative. Ki67 is 5%.  She presents today to the [facility]) for treatment consideration and recommendations from the breast surgeon, radiation oncologist, and medical oncologist.     I briefly met with Sarah Lewis during her [facility] visit today. We discussed the purpose of the Survivorship Clinic, which will include monitoring for recurrence, coordinating completion of age and gender-appropriate cancer screenings, promotion of overall wellness, as well as managing potential late/long-term side effects of anti-cancer treatments.    The treatment plan for Sarah Lewis will likely include surgery and anti-estrogen therapy.  As of today, the intent of treatment for Sarah Lewis is cure, therefore she will be eligible for the Survivorship Clinic upon her completion of treatment.  Her survivorship care plan (SCP) document will be drafted and updated throughout the course of her treatment trajectory. She will receive the SCP in an office visit with myself in the Survivorship Clinic once she has completed treatment.   Sarah Lewis was encouraged to ask questions and all questions were answered to her satisfaction.  She was given my business card and encouraged to contact me with any concerns regarding survivorship.  I look forward to participating in her care.   Kenn File, [place] [phone]

## 2015-10-12 NOTE — Progress Notes (Signed)
[facility]  [phone] [phone]     ID: Sarah Lewis DOB: [birth year]  MR#: [id number]  IOE#:[id number]  Patient Care Team: Carol Ada, MD as PCP - General (Family Medicine) Stark Klein, MD as Consulting Physician (General Surgery) Chauncey Cruel, MD as Consulting Physician (Oncology) Kyung Rudd, MD as Consulting Physician (Radiation Oncology) Mauro Kaufmann, RN as Registered Nurse Rockwell Germany, RN as Registered Nurse Sylvan Cheese, NP as Nurse Practitioner (Hematology and Oncology) PCP: Reginia Naas, MD OTHER MD: Jacelyn Pi MD, Allyn Kenner MD  CHIEF COMPLAINT: Estrogen receptor positive breast cancer  CURRENT TREATMENT: Awaiting definitive surgery  BREAST CANCER HISTORY: Sarah Lewis tells me she found her left breast cancer in 1979 GERD "they were not doing mammograms then". She brought it to medical attention and Dr. Loralee Pacas did her left mastectomy and axillary lymph node dissection. She was then seen by Dr. Nicholes Stairs for medical oncology and received chemotherapy for 18 months (almost certainly this was CMF given every 3 weeks).  More recently she again herself felt a bump, in the underside of her right breast this time. Since she was scheduled for mammography later the same week, she simply mentioned it at the time of mammography, so that on 10/03/2015 she underwent bilateral diagnostic mammography with tomosynthesis at [facility]. The breast density was category D. In the right breast at the 6:00 position there was an irregular nodular density which by ultrasonography the same day was lobulated and measured 6 mm.  On 10/06/2015 she underwent ultrasound-guided biopsy of the right breast mass in question, and this found ([id number]) an invasive ductal carcinoma, grade 1 or 2, as well as in situ ductal carcinoma. The invasive tumor was estrogen receptor positive at 100%, progesterone receptor positive at 95%, both  with strong staining intensity, with an MIB-1 of 5%, and no HER-2 amplification, the signals ratio being 1.10 and the number per cell 2.15.  The patient's subsequent history is as detailed below  INTERVAL HISTORY: Sarah Lewis was evaluated in the multidisciplinary breast cancer conference 10/12/2015. Her case was also presented in the multidisciplinary breast cancer conference that same morning. At that time a preliminary plan was proposed: Consider breast conserving surgery with or without sentinel lymph node sampling, followed by adjuvant radiation and antiestrogen to as appropriate. Genetics testing was also recommended.  REVIEW OF SYSTEMS: Aside from the mass itself, there were no specific symptoms leading to the original mammogram, which was routinely scheduled. The patient denies unusual headaches, visual changes, nausea, vomiting, stiff neck, dizziness, or gait imbalance. There has been no cough, phlegm production, or pleurisy, no chest pain or pressure, and no change in bowel or bladder habits. The patient denies fever, rash, bleeding, unexplained fatigue or unexplained weight loss. The patient reports easy bruising and arthritis chiefly involving "a few of my fingers". A detailed review of systems was otherwise entirely negative.      PAST MEDICAL HISTORY: Past Medical History  Diagnosis Date  . Osteoporosis   . Hypertension   . Cough     from toporol  . Breast cancer of lower-outer quadrant of right female breast ([place]) 10/10/2015  . Osteoporosis     PAST SURGICAL HISTORY: Past Surgical History  Procedure Laterality Date  . Cesarean section    . Abdominal hysterectomy    . Hernia repair    . Mastectomy Left 1979    FAMILY HISTORY Family History  Problem Relation Age of Onset  . Bone cancer Father  the patient's father had metastatic prostate cancer but died at 83 from heart disease. The patient's mother died from a clot to her lung at the age of 65, 3 months after the  patient's father died. Myishia had no brothers, one sister. There is no other history of breast or ovarian cancer in the family.   GYNECOLOGIC HISTORY:  No LMP recorded.  menarche age 38, first live birth age 11. The patient is GX P2. She underwent hysterectomy with bilateral salpingo-oophorectomy in 1982. She did not take hormone replacement. She never took oral contraceptives.   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 Her husband Sarah Lewis is a retired Medical illustrator. Daughter Sarah Lewis avoid lives in [place] where she works as a Print production planner. Son Sarah Lewis lives in [place] where he works for an IT consultant. The patient is a Tourist information centre manager.    ADVANCED DIRECTIVES: In place    HEALTH MAINTENANCE: Social History  Substance Use Topics  . Smoking status: Never Smoker   . Smokeless tobacco: None  . Alcohol Use: No     Colonoscopy: 2006   PAP:  Bone density: osteoporosis   Lipid panel:  No Known Allergies  Current Outpatient Prescriptions  Medication Sig Dispense Refill  . aspirin 81 MG tablet Take 81 mg by mouth daily.    . calcium-vitamin D (OSCAL WITH D) 500-200 MG-UNIT per tablet Take 2 tablets by mouth daily.    . chlorpheniramine-HYDROcodone (TUSSIONEX) 10-8 MG/5ML SUER TAKE 1/4 TO 1/2 TEASPOONFUL ONCE A DAY FOR CHRONIC COUGH  0  . cholecalciferol (VITAMIN D) 1000 UNITS tablet Take 1,000 Units by mouth daily.    . metoprolol succinate (TOPROL-XL) 50 MG 24 hr tablet   0   No current facility-administered medications for this visit.    OBJECTIVE: older white woman who appears stated age  79 Vitals:   10/12/15 1323  BP: 178/81  Pulse: 71  Temp: 98.7 F (37.1 C)  Resp: 18     Body mass index is 17.09 kg/(m^2).    ECOG FS:0 - Asymptomatic  Ocular: Sclerae unicteric, pupils equal, round and reactive to light Ear-nose-throat: Oropharynx clear and moist Lymphatic: No cervical or supraclavicular adenopathy Lungs no rales  or rhonchi, good excursion bilaterally Heart irregular rate and rhythm, which the patient tells me is long-standing; no murmur appreciated Abd soft, nontender, positive bowel sounds MSK no focal spinal tenderness, no joint edema Neuro: non-focal, well-oriented, appropriate affect Breasts: The right breast is status post recent biopsy. There is a significant ecchymosis inferiorly. There is an easily palpable 1-1/2 cm mass at the 6:00 position which is 2 and 7:30 centimeters from the nipple    LAB RESULTS:  CMP     Component Value Date/Time   NA 140 10/12/2015 1229   K 4.1 10/12/2015 1229   CO2 28 10/12/2015 1229   GLUCOSE 92 10/12/2015 1229   BUN 13.9 10/12/2015 1229   CREATININE 0.8 10/12/2015 1229   CALCIUM 9.6 10/12/2015 1229   PROT 7.2 10/12/2015 1229   ALBUMIN 4.2 10/12/2015 1229   AST 21 10/12/2015 1229   ALT 17 10/12/2015 1229   ALKPHOS 65 10/12/2015 1229   BILITOT 0.55 10/12/2015 1229    INo results found for: SPEP, UPEP  Lab Results  Component Value Date   WBC 6.2 10/12/2015   NEUTROABS 4.0 10/12/2015   HGB 14.2 10/12/2015   HCT 42.0 10/12/2015   MCV 90.0 10/12/2015   PLT 268 10/12/2015  Chemistry      Component Value Date/Time   NA 140 10/12/2015 1229   K 4.1 10/12/2015 1229   CO2 28 10/12/2015 1229   BUN 13.9 10/12/2015 1229   CREATININE 0.8 10/12/2015 1229      Component Value Date/Time   CALCIUM 9.6 10/12/2015 1229   ALKPHOS 65 10/12/2015 1229   AST 21 10/12/2015 1229   ALT 17 10/12/2015 1229   BILITOT 0.55 10/12/2015 1229       No results found for: LABCA2  No components found for: LABCA125  No results for input(s): INR in the last 168 hours.  Urinalysis No results found for: COLORURINE, APPEARANCEUR, LABSPEC, PHURINE, GLUCOSEU, HGBUR, BILIRUBINUR, KETONESUR, PROTEINUR, UROBILINOGEN, NITRITE, LEUKOCYTESUR  STUDIES: Dg Chest 2 View  10/12/2015  CLINICAL DATA:  Chronic cough and wheezing on physical exam EXAM: CHEST - 2 VIEW  COMPARISON:  None. FINDINGS: Cardiac shadow is mildly enlarged. The lungs are well aerated bilaterally. No focal infiltrate or sizable effusion is noted. Increased density is noted over the left mid chest secondary to prior reconstructive surgery. IMPRESSION: No active disease. Electronically Signed   By: Inez Catalina M.D.   On: 10/12/2015 09:49    ASSESSMENT: 79 y.o. [place] woman  (1) status post left mastectomy and neck were lymph node dissection with silicone implant reconstruction in 1979 for lymph node positive breast cancer treated adjuvantly with chemotherapy (likely CMF) for 18 months  (2) status post right breast biopsy 10/06/2015 for a clinical T1b N0, stage IA invasive ductal carcinoma, grade 1 or 2, with mucinous features; estrogen and progesterone receptor positive  (3) definitive surgery pending  (4) adjuvant radiation to follow surgery if appropriate  (5) tamoxifen to follow local treatment  (6) genetics testing pending  (7) osteoporosis, on zolendronate   PLAN: We spent the better part of today's hour-long appointment discussing the biology of breast cancer in general, and the specifics of the patient's tumor in particular. Laronica understands she has a very small breast cancer which is slow growing. We discussed the words "ductal" and "invasive" so that she would understand the origin of her tumor and also the fact that invasive does not mean that it is invading all over the breast but only that the cancer cells got out of the duct where they started.  She is considering mastectomy with implant reconstruction because that is what she did on the left side and that is not unreasonable. If she does end up with mastectomy she would not need radiation treatments and that also is a motivator.  The decision for systemic treatment is unrelated to the type of local treatment and so we discussed that separately. In general, we do not consider chemotherapy for cancers that are 5 mm  or less, and hers is only 1 mm more than that. She is going to turn 79 years old in a couple of months, and in general I would not favor chemotherapy in this case even if we found a positive lymph node. For that reason I am not suggesting an Oncotype be sent from the definitive surgical specimen.  On the other hand I think it would be a good idea for her to consider anti-estrogens, as per NCCN guidelines. Especially if her surgery is gone to be delayed more than 4 weeks on a because of difficulty scheduling both a plastic and general surgeon, I think her starting tamoxifen might be a good idea. Accordingly we discussed the possible toxicities, side effects and complications of tamoxifen as  well as cost issues. She understands that this would also help with her osteoporosis, to a small extent.  I'm making her a return appointment with me for January. By that time she should be done with local treatment and we should be able to decide whether she wishes to pursue longer-term anti-estrogens.  Arilynn has a good understanding of the overall plan. She agrees with it. She knows the goal of treatment in her case is cure. She will call with any problems that may develop before her next visit here.  Chauncey Cruel, MD   10/12/2015 3:29 PM Medical Oncology and Hematology [facility] [address] [place], [place] [address] Tel. [phone]    Fax. [phone]

## 2015-10-12 NOTE — Patient Instructions (Signed)

## 2015-10-12 NOTE — Therapy (Signed)
[place], [place], [address] Phone: [phone]   Fax:  [phone]  Physical Therapy Evaluation  Patient Details  Name: Sarah Lewis MRN: [id number] Date of Birth: 1936-06-12 Referring Provider: Dr. Stark Klein  Encounter Date: 10/12/2015      PT End of Session - 10/12/15 1631    Visit Number 1   Number of Visits 1   PT Start Time 3235   PT Stop Time 1432   PT Time Calculation (min) 27 min   Activity Tolerance Patient tolerated treatment well   Behavior During Therapy [facility] for tasks assessed/performed      Past Medical History  Diagnosis Date  . Osteoporosis   . Hypertension   . Cough     from toporol  . Breast cancer of lower-outer quadrant of right female breast ([place]) 10/10/2015  . Osteoporosis     Past Surgical History  Procedure Laterality Date  . Cesarean section    . Abdominal hysterectomy    . Hernia repair    . Mastectomy Left 1979    There were no vitals filed for this visit.  Visit Diagnosis:  Carcinoma of lower outer quadrant of right breast ([place]) - Plan: PT plan of care cert/re-cert  Abnormal posture - Plan: PT plan of care cert/re-cert      Subjective Assessment - 10/12/15 1624    Subjective Patient was seen today for a baseline assessment of her newly diagnosed right breast cancer.   Pertinent History Patient was diagnosed on 10/03/15 with right ER/PR positive, HER2 negative breast cancer with a Ki67 of 5%.  It is located in the lower outer quadrant and measures 6 mm.   Patient Stated Goals Reduce lymphedema risk and learn post op shoulder ROM HEP   Currently in Pain? Yes   Pain Score 4    Pain Location Finger (Comment which one)  All fingers / thumb due to osteoarthritis   Pain Orientation Right;Left   Pain Descriptors / Indicators Aching   Pain Type Chronic pain   Pain Onset More than a month ago   Pain Frequency Intermittent   Aggravating Factors   Cold, using hands   Pain Relieving Factors heat            [facility] PT Assessment - 10/12/15 0001    Assessment   Medical Diagnosis Right breast cancer   Referring Provider Dr. Stark Klein   Onset Date/Surgical Date 10/03/15   Hand Dominance Right   Prior Therapy none   Precautions   Precautions Other (comment)  Right breast cancer; hx left breast cancer   Restrictions   Weight Bearing Restrictions No   Balance Screen   Has the patient fallen in the past 6 months No   Has the patient had a decrease in activity level because of a fear of falling?  No   Is the patient reluctant to leave their home because of a fear of falling?  No   Home Environment   Living Environment Private residence   Living Arrangements Spouse/significant other   Available Help at Discharge Family   Prior Function   Level of [place] Retired   Leisure She exercises at the [facility] twice a week for 30 minutes on the treadmill   Cognition   Overall Cognitive Status Within Functional Limits for tasks assessed   Posture/Postural Control   Posture/Postural Control Postural limitations   Postural Limitations Rounded Shoulders;Forward head;Increased thoracic kyphosis   ROM /  Strength   AROM / PROM / Strength AROM   AROM   AROM Assessment Site Shoulder   Right/Left Shoulder Right;Left   Right Shoulder Extension 44 Degrees   Right Shoulder Flexion 137 Degrees   Right Shoulder ABduction 144 Degrees   Right Shoulder Internal Rotation 59 Degrees   Right Shoulder External Rotation 80 Degrees   Left Shoulder Extension 58 Degrees   Left Shoulder Flexion 155 Degrees   Left Shoulder ABduction 143 Degrees   Left Shoulder Internal Rotation 65 Degrees   Left Shoulder External Rotation 74 Degrees           LYMPHEDEMA/ONCOLOGY QUESTIONNAIRE - 10/12/15 1629    Type   Cancer Type Right breast cancer   Lymphedema Assessments   Lymphedema Assessments Upper extremities   Right Upper  Extremity Lymphedema   10 cm Proximal to Olecranon Process 20 cm   Olecranon Process 19.5 cm   10 cm Proximal to Ulnar Styloid Process 19 cm   Just Proximal to Ulnar Styloid Process 12.7 cm   Across Hand at [employer] 16 cm   At [place] of 2nd Digit 5.5 cm   Left Upper Extremity Lymphedema   10 cm Proximal to Olecranon Process 21.2 cm   Olecranon Process 19.4 cm   10 cm Proximal to Ulnar Styloid Process 17 cm   Just Proximal to Ulnar Styloid Process 12.8 cm   Across Hand at [employer] 15.8 cm   At [place] of 2nd Digit 5.4 cm      Patient was instructed today in a home exercise program today for post op shoulder range of motion. These included active assist shoulder flexion in sitting, scapular retraction, wall walking with shoulder abduction, and hands behind head external rotation.  She was encouraged to do these twice a day, holding 3 seconds and repeating 5 times when permitted by her physician.         PT Education - 10/12/15 1631    Education provided Yes   Education Details Post op shoulder ROM HEP and lymphedema risk reduction   Person(s) Educated Patient   Methods Explanation;Demonstration;Handout   Comprehension Returned demonstration;Verbalized understanding              Breast Clinic Goals - 10/12/15 1634    Patient will be able to verbalize understanding of pertinent lymphedema risk reduction practices relevant to her diagnosis specifically related to skin care.   Time 1   Period Days   Status Achieved   Patient will be able to return demonstrate and/or verbalize understanding of the post-op home exercise program related to regaining shoulder range of motion.   Time 1   Period Days   Status Achieved   Patient will be able to verbalize understanding of the importance of attending the postoperative After Breast Cancer Class for further lymphedema risk reduction education and therapeutic exercise.   Time 1   Period Days   Status Achieved               Plan - 10/12/15 1632    Clinical Impression Statement Patient was diagnosed on 10/03/15 with right ER/PR positive, HER2 negative breast cancer with a Ki67 of 5%.  It is located in the lower outer quadrant and measures 6 mm.  She is planning to have a right mastectomy and sentinel node biopsy as she has had a history of a left mastectomy in 1979.  She will benefit from post op PT to regain shoulder ROM and strength.  Pt will benefit from skilled therapeutic intervention in order to improve on the following deficits Decreased strength;Decreased knowledge of precautions;Pain;Impaired UE functional use;Decreased range of motion   Rehab Potential Excellent   Clinical Impairments Affecting Rehab Potential none   PT Frequency One time visit   PT Treatment/Interventions Therapeutic exercise;Patient/family education   Consulted and Agree with Plan of Care Patient     Patient will follow up at outpatient cancer rehab if needed following surgery.  If the patient requires physical therapy at that time, a specific plan will be dictated and sent to the referring physician for approval. The patient was educated today on appropriate basic range of motion exercises to begin post operatively and the importance of attending the After Breast Cancer class following surgery.  Patient was educated today on lymphedema risk reduction practices as it pertains to recommendations that will benefit the patient immediately following surgery.  She verbalized good understanding.  No additional physical therapy is indicated at this time.         G-Codes - 2015/10/27 1634    Functional Assessment Tool Used Clinical Judgement   Functional Limitation Self care   Self Care Current Status [phone]) At least 1 percent but less than 20 percent impaired, limited or restricted   Self Care Goal Status (W9047) At least 1 percent but less than 20 percent impaired, limited or restricted   Self Care Discharge Status [phone]) At least 1  percent but less than 20 percent impaired, limited or restricted       Problem List Patient Active Problem List   Diagnosis Date Noted  . Breast cancer of lower-outer quadrant of right female breast ([place]) 10/10/2015  . RHINITIS 01/02/2008  . BRONCHITIS 01/02/2008  . C O P D 01/02/2008  . Cough 01/01/2008   Annia Friendly, PT 10/27/15 4:36 PM  [place] [facility] [place], [place], [address] Phone: [phone]   Fax:  [phone]  Name: Sarah Lewis MRN: [id number] Date of Birth: June 25, 1936

## 2015-10-13 ENCOUNTER — Other Ambulatory Visit: Payer: Self-pay | Admitting: *Deleted

## 2015-10-13 MED ORDER — TAMOXIFEN CITRATE 20 MG PO TABS: 20.0000 mg | ORAL_TABLET | Freq: Every day | ORAL | Status: DC

## 2015-10-14 ENCOUNTER — Encounter: Payer: Self-pay | Admitting: Radiation Oncology

## 2015-10-14 NOTE — Progress Notes (Signed)
Radiation Oncology         [phone] ________________________________  Name: Sarah Lewis MRN: [id number]  Date: 10/12/2015  DOB: 03/30/1936  QZ:[id number] Weyman Croon, MD  Stark Klein, MD     REFERRING PHYSICIAN: Stark Klein, MD   DIAGNOSIS: The encounter diagnosis was Breast cancer of lower-outer quadrant of right female breast ([place]).  Breast cancer of lower-outer quadrant of right female breast [facility])   Staging form: Breast, AJCC 7th Edition     Clinical stage from 10/12/2015: Stage IA (T1b, N0, M0) - Unsigned    HISTORY OF PRESENT ILLNESS::Sarah Lewis is a 79 y.o. female who is seen for an initial consultation visit regarding the patient's diagnosis of breast cancer.  The patient was found to have suspicious findings within the right breast on initial mammogram. This was a palpable finding prior to mammogram.  A diagnostic mammogram and breast ultrasound confirmed this finding. On ultrasound, the tumor measured 0.6 cm and was present in the lower outer quadrant.  A biopsy was performed. 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 Receptors studies were completed and indicate that the tumor is estrogen receptor positive, progesterone receptor positive, and Her-2/neu negative. The Ki-67 staining was 5 %.   The patient has not undergone an MRI scan of the breasts: This is currently not planned.    PREVIOUS RADIATION THERAPY: No   PAST MEDICAL HISTORY:  has a past medical history of Osteoporosis; Hypertension; Cough; Breast cancer of lower-outer quadrant of right female breast ([place]) (10/10/2015); and Osteoporosis.     PAST SURGICAL HISTORY: Past Surgical History  Procedure Laterality Date  . Cesarean section    . Abdominal hysterectomy    . Hernia repair    . Mastectomy Left 1979     FAMILY HISTORY: family history includes Bone cancer in her father.   SOCIAL HISTORY:  reports that she has never smoked. She does not have any smokeless  tobacco history on file. She reports that she does not drink alcohol or use illicit drugs.   ALLERGIES: Review of patient's allergies indicates no known allergies.   MEDICATIONS:  Current Outpatient Prescriptions  Medication Sig Dispense Refill  . aspirin 81 MG tablet Take 81 mg by mouth daily.    . calcium-vitamin D (OSCAL WITH D) 500-200 MG-UNIT per tablet Take 2 tablets by mouth daily.    . chlorpheniramine-HYDROcodone (TUSSIONEX) 10-8 MG/5ML SUER TAKE 1/4 TO 1/2 TEASPOONFUL ONCE A DAY FOR CHRONIC COUGH  0  . cholecalciferol (VITAMIN D) 1000 UNITS tablet Take 1,000 Units by mouth daily.    . metoprolol succinate (TOPROL-XL) 50 MG 24 hr tablet   0  . tamoxifen (NOLVADEX) 20 MG tablet Take 1 tablet (20 mg total) by mouth daily. 90 tablet 12   No current facility-administered medications for this encounter.     REVIEW OF SYSTEMS:  A 15 point review of systems is documented in the electronic medical record. This was obtained by the nursing staff. However, I reviewed this with the patient to discuss relevant findings and make appropriate changes.  Pertinent items are noted in HPI.    PHYSICAL EXAM:  vitals were not taken for this visit.  ECOG = 0  0 - Asymptomatic (Fully active, able to carry on all predisease activities without restriction)  1 - Symptomatic but completely ambulatory (Restricted in physically strenuous activity but ambulatory and able to carry out work of a light or sedentary nature. For example, light housework, office work)  2 -  Symptomatic, <50% in bed during the day (Ambulatory and capable of all self care but unable to carry out any work activities. Up and about more than 50% of waking hours)  3 - Symptomatic, >50% in bed, but not bedbound (Capable of only limited self-care, confined to bed or chair 50% or more of waking hours)  4 - Bedbound (Completely disabled. Cannot carry on any self-care. Totally confined to bed or chair)  5 - Death   Eustace Pen MM, Creech RH,  Tormey DC, et al. [phone]). "Toxicity and response criteria of the [facility]". [place] Oncol. 5 (6): 649-55  General: Well-developed, in no acute distress HEENT: Normocephalic, atraumatic; oral cavity clear Neuro: No focal deficits     LABORATORY DATA:  Lab Results  Component Value Date   WBC 6.2 10/12/2015   HGB 14.2 10/12/2015   HCT 42.0 10/12/2015   MCV 90.0 10/12/2015   PLT 268 10/12/2015   Lab Results  Component Value Date   NA 140 10/12/2015   K 4.1 10/12/2015   CO2 28 10/12/2015   Lab Results  Component Value Date   ALT 17 10/12/2015   AST 21 10/12/2015   ALKPHOS 65 10/12/2015   BILITOT 0.55 10/12/2015      RADIOGRAPHY: Dg Chest 2 View  10/12/2015  CLINICAL DATA:  Chronic cough and wheezing on physical exam EXAM: CHEST - 2 VIEW COMPARISON:  None. FINDINGS: Cardiac shadow is mildly enlarged. The lungs are well aerated bilaterally. No focal infiltrate or sizable effusion is noted. Increased density is noted over the left mid chest secondary to prior reconstructive surgery. IMPRESSION: No active disease. Electronically Signed   By: Inez Catalina M.D.   On: 10/12/2015 09:49       IMPRESSION:   No history exists.    The patient has a recent diagnosis of invasive ductal carcinoma of the right breast. She appears to be a good candidate for breast conservation treatment. However, the patient has a history of mastectomy on the left approximately 40 years ago. She received chemotherapy for this, No radiation treatment. The patient therefore indicated today in clinic he desire to proceed with a mastectomy on the right as well. She understands that this is not necessary in terms of the size of the tumor, but she does wish to proceed with this given the contralateral mastectomy.  I discussed with the patient that if she proceeds with a mastectomy, then post surgical radiation treatment would not likely be necessary based on the available information  today.  All of the patient's questions were answered. The patient wishes to proceed with radiation treatment at the appropriate time.  PLAN: I will be happy to see the patient postoperatively if her final pathologic results after mastectomy indicate that radiation treatment may be potentially beneficial. Otherwise, no further follow-up has been scheduled.      ________________________________   [name] Gross, MD, PhD   **Disclaimer: This note was dictated with voice recognition software. Similar sounding words can inadvertently be transcribed and this note may contain transcription errors which may not have been corrected upon publication of note.**

## 2015-10-18 ENCOUNTER — Telehealth: Payer: Self-pay | Admitting: *Deleted

## 2015-10-18 NOTE — Telephone Encounter (Signed)
Spoke with patient from [facility] 10/26. She met with Dr. Harlow Mares today and has decided to have a mastectomy with reconstruction.  We will look for that to be scheduled.  Encouraged her to call with any needs or concerns.

## 2015-10-19 ENCOUNTER — Other Ambulatory Visit: Payer: Self-pay | Admitting: General Surgery

## 2015-10-21 ENCOUNTER — Encounter: Payer: Self-pay | Admitting: General Practice

## 2015-10-21 NOTE — Progress Notes (Signed)
[place] Psychosocial Distress Screening Spiritual Care  Spiritual Care was referred by distress screening protocol.  The patient scored a 5 on the Psychosocial Distress Thermometer which indicates moderate distress. Chaplain phoned to assess for distress and other psychosocial needs.   ONCBCN DISTRESS SCREENING 10/21/2015  Screening Type Initial Screening  Distress experienced in past week (1-10) 5  Emotional problem type Adjusting to illness  Information Concerns Type Lack of info about diagnosis;Lack of info about treatment;Lack of info about complementary therapy choices  Referral to support programs Yes  Other Spiritual Care    Sarah Lewis was in good spirits during this f/u phone call.  She reports "wonderful support" from her husband, church friends, and "lots of faith."  Per pt, her anxiety is low because she went through breast cancer at age 16--and treatment has progressed so much since then.  She named her fear of recurrence, which guided her decision for mastectomy rather than lumpectomy and radiation.  She is eager for surgery, feels confident in the care she will receive afterward, and reports no other concerns at this time.  She is aware of Support Team availability if desired.  Please page if needs arise/circumstances change.  Thank you.  Follow up needed: No.   Chaplain Lorrin Jackson, [place], [facility] Pager [phone] Voicemail  [phone]

## 2015-10-27 ENCOUNTER — Other Ambulatory Visit: Payer: Self-pay | Admitting: Oncology

## 2015-10-27 ENCOUNTER — Encounter: Payer: Medicare Other | Admitting: Genetic Counselor

## 2015-10-27 ENCOUNTER — Other Ambulatory Visit: Payer: Medicare Other

## 2015-10-28 ENCOUNTER — Other Ambulatory Visit: Payer: Self-pay

## 2015-10-28 ENCOUNTER — Telehealth: Payer: Self-pay

## 2015-10-28 NOTE — Telephone Encounter (Signed)
Pt left msg reporting surgery scheduled for 12/27 and asking when/if she should start tamoxifen.  Per Dr. Jana Hakim, pt should go ahead and start Tamoxifen and hold it for 2 days prior to surgery.  Three attempts were made to call patient - all were not answered, no answering machine or voice mail.

## 2015-10-31 ENCOUNTER — Telehealth: Payer: Self-pay | Admitting: *Deleted

## 2015-10-31 NOTE — Telephone Encounter (Signed)
Pt called to TRIAGE this am per call on Friday 11/11-   This RN advised pt per documentation noted regarding her inquiry about tamoxifen.  No other needs at this time.

## 2015-11-08 ENCOUNTER — Other Ambulatory Visit: Payer: Self-pay | Admitting: General Surgery

## 2015-11-11 ENCOUNTER — Telehealth: Payer: Self-pay | Admitting: *Deleted

## 2015-11-11 NOTE — Telephone Encounter (Signed)
-----   Message from Rockwell Germany, RN sent at 10/31/2015  3:04 PM EST ----- Regarding: Care Plan Patient was seen in [facility] 10/12/15.  She is scheduled for the following.  Surgery - 12/27 (Right Mastectomy with reconstruction) Dr. Jana Hakim - 1/24  Thanks,  Varney Biles

## 2015-11-30 ENCOUNTER — Other Ambulatory Visit (HOSPITAL_COMMUNITY): Payer: Self-pay | Admitting: Plastic Surgery

## 2015-12-05 ENCOUNTER — Inpatient Hospital Stay (HOSPITAL_COMMUNITY)
Admission: RE | Admit: 2015-12-05 | Discharge: 2015-12-05 | Disposition: A | Discharge status: Home / Self Care | Payer: Medicare Other | Source: Ambulatory Visit

## 2015-12-05 NOTE — Pre-Procedure Instructions (Signed)
KOREE WAYSON  12/05/2015      [id number] - [place], [place] [place] [address] Phone: [phone] Fax: [phone]    Your procedure is scheduled on Tuesday, Dec. 27th   Report to [facility] Admitting at 7:00 Am  Call this number if you have problems the morning of surgery:  [phone]   Remember:  Do not eat food or drink liquids after midnight Monday.  Take these medicines the morning of surgery with A SIP OF WATER : Toprol XL   Do not wear jewelry, make-up or nail polish.  Do not wear lotions, powders, or perfumes.  You may NOT wear deodorant the day of surgery.  Do not shave underarms & legs 48 hours prior to surgery.     Do not bring valuables to the hospital.  [facility] is not responsible for any belongings or valuables.  Contacts, dentures or bridgework may not be worn into surgery.  Leave your suitcase in the car.  After surgery it may be brought to your room.  For patients admitted to the hospital, discharge time will be determined by your treatment team.   Name and phone number of your driver:     Please read over the following fact sheets that you were given. Pain Booklet, Coughing and Deep Breathing and Surgical Site Infection Prevention

## 2015-12-07 ENCOUNTER — Encounter (HOSPITAL_COMMUNITY): Payer: Self-pay

## 2015-12-07 ENCOUNTER — Encounter (HOSPITAL_COMMUNITY)
Admission: RE | Admit: 2015-12-07 | Discharge: 2015-12-07 | Disposition: A | Discharge status: Home / Self Care | Payer: Medicare Other | Source: Ambulatory Visit | Attending: General Surgery | Admitting: General Surgery

## 2015-12-07 DIAGNOSIS — I10 Essential (primary) hypertension: Secondary | ICD-10-CM | POA: Insufficient documentation

## 2015-12-07 DIAGNOSIS — I444 Left anterior fascicular block: Secondary | ICD-10-CM | POA: Insufficient documentation

## 2015-12-07 DIAGNOSIS — I498 Other specified cardiac arrhythmias: Secondary | ICD-10-CM | POA: Diagnosis not present

## 2015-12-07 DIAGNOSIS — Z01812 Encounter for preprocedural laboratory examination: Secondary | ICD-10-CM | POA: Diagnosis not present

## 2015-12-07 DIAGNOSIS — C50919 Malignant neoplasm of unspecified site of unspecified female breast: Secondary | ICD-10-CM | POA: Insufficient documentation

## 2015-12-07 DIAGNOSIS — Z79899 Other long term (current) drug therapy: Secondary | ICD-10-CM | POA: Diagnosis not present

## 2015-12-07 DIAGNOSIS — Z01818 Encounter for other preprocedural examination: Secondary | ICD-10-CM | POA: Diagnosis present

## 2015-12-07 HISTORY — DX: Cardiac murmur, unspecified: R01.1

## 2015-12-07 HISTORY — DX: Other reaction to spinal and lumbar puncture: G97.1

## 2015-12-07 LAB — BASIC METABOLIC PANEL
ANION GAP: 8 (ref 5–15)
BUN: 12 mg/dL (ref 6–20)
CHLORIDE: 101 mmol/L (ref 101–111)
CO2: 30 mmol/L (ref 22–32)
CREATININE: 0.75 mg/dL (ref 0.44–1.00)
Calcium: 9 mg/dL (ref 8.9–10.3)
GFR calc non Af Amer: >60 mL/min (ref >60)
Glucose, Bld: 103 mg/dL — ABNORMAL HIGH (ref 65–99)
POTASSIUM: 3.9 mmol/L (ref 3.5–5.1)
Sodium: 139 mmol/L (ref 135–145)

## 2015-12-07 LAB — CBC
HEMATOCRIT: 41.4 % (ref 36.0–46.0)
HEMOGLOBIN: 13.8 g/dL (ref 12.0–15.0)
MCH: 30.7 pg (ref 26.0–34.0)
MCHC: 33.3 g/dL (ref 30.0–36.0)
MCV: 92.2 fL (ref 78.0–100.0)
Platelets: 242 10*3/uL (ref 150–400)
RBC: 4.49 MIL/uL (ref 3.87–5.11)
RDW: 12.6 % (ref 11.5–15.5)
WBC: 7.3 10*3/uL (ref 4.0–10.5)

## 2015-12-07 NOTE — Progress Notes (Signed)
Anesthesia Chart Review:  Pt is a 79 year old female scheduled for R mastectomy, breast reconstruction with placement of silicone gel implant or tissue expander and possible acellular derma matrix on 12/13/2015 with Dr. Barry Dienes and Dr. Harlow Mares.   PMH includes:  HTN, heart murmur, breast cancer. Never smoker. BMI 17.5.   Anesthesia history includes post-op N/V and spinal headache.   Medications include: ASA, metoprolol, tamoxifen  Preoperative labs reviewed.    Chest x-ray 10/12/15 reviewed. No active disease.   EKG 12/07/15: Sinus rhythm with marked sinus arrhythmia. Possible Left atrial enlargement. Left anterior fascicular block. Nonspecific ST abnormality.  Echo 09/28/13:  - Left ventricle: Systolic function was normal. The estimated ejection fraction was in the range of 60% to 65%. - Aortic valve: Mild to moderate regurgitation. - Mitral valve: Prolapse cannot be excluded. Mild regurgitation. - Tricuspid valve: Moderate regurgitation. - Pulmonary arteries: Systolic pressure was mildly increased. PA peak pressure: 71mm Hg (S).  Reviewed case with Dr. Gifford Shave. No sx documented at PAT.   If no changes, I anticipate pt can proceed with surgery as scheduled.   Sarah Cass, FNP-BC [facility]/Anesthesiology Phone: [phone] 12/07/2015 3:11 PM

## 2015-12-07 NOTE — Progress Notes (Signed)
Pt denies SOB, chest pain, and being under the care of a cardiologist. Pt denies having a stress test and cardiac cath. Pt denies having an EKG within the last year. Pt chart forwarded to anesthesia to review cardiac history.

## 2015-12-07 NOTE — Pre-Procedure Instructions (Signed)
    Sarah Lewis  12/07/2015      [id number] - [place], [place] - [place]. [place] [address] Phone: [phone] Fax: [phone]    Your procedure is scheduled on Tuesday, December 13, 2015  Report to [facility] Admitting at 7:00 A.M.  Call this number if you have problems the morning of surgery:  [phone]   Remember:  Do not eat food or drink liquids after midnight Monday, December 12, 2015  Take these medicines the morning of surgery with A SIP OF WATER : metoprolol succinate (TOPROL-XL)  Stop taking Aspirin, vitamins, fish oil, and herbal medications. Do not take any NSAIDs ie: Ibuprofen, Advil, Naproxen or any medication containing Aspirin; stop now.   Do not wear jewelry, make-up or nail polish.  Do not wear lotions, powders, or perfumes.  You may not wear deodorant.  Do not shave 48 hours prior to surgery.   Do not bring valuables to the hospital.  [facility] is not responsible for any belongings or valuables.  Contacts, dentures or bridgework may not be worn into surgery.  Leave your suitcase in the car.  After surgery it may be brought to your room.  For patients admitted to the hospital, discharge time will be determined by your treatment team.  Patients discharged the day of surgery will not be allowed to drive home.   Name and phone number of your driver:    Special instructions: Shower the night before surgery and the morning of surgery with CHG.  Please read over the following fact sheets that you were given. Pain Booklet, Coughing and Deep Breathing and Surgical Site Infection Prevention

## 2015-12-12 MED ORDER — CEFAZOLIN SODIUM-DEXTROSE 2-3 GM-% IV SOLR
2.0000 g | INTRAVENOUS | Status: AC
  Administered 2015-12-13: 2 g via INTRAVENOUS
  Filled 2015-12-12: qty 50

## 2015-12-12 MED ORDER — HEPARIN SODIUM (PORCINE) 5000 UNIT/ML IJ SOLN
5000.0000 [IU] | INTRAMUSCULAR | Status: AC
  Administered 2015-12-13: 5000 [IU] via SUBCUTANEOUS
  Filled 2015-12-12: qty 1

## 2015-12-13 ENCOUNTER — Ambulatory Visit (HOSPITAL_COMMUNITY): Payer: Medicare Other | Admitting: Emergency Medicine

## 2015-12-13 ENCOUNTER — Ambulatory Visit (HOSPITAL_COMMUNITY): Payer: Medicare Other | Admitting: Anesthesiology

## 2015-12-13 ENCOUNTER — Encounter (HOSPITAL_COMMUNITY): Payer: Self-pay

## 2015-12-13 ENCOUNTER — Encounter (HOSPITAL_COMMUNITY)
Admission: AD | Disposition: A | Discharge status: Home / Self Care | Payer: Self-pay | Source: Ambulatory Visit | Attending: Plastic Surgery

## 2015-12-13 ENCOUNTER — Inpatient Hospital Stay (HOSPITAL_COMMUNITY)
Admission: AD | Admit: 2015-12-13 | Discharge: 2015-12-15 | DRG: 583 | Disposition: A | Discharge status: Home / Self Care | Payer: Medicare Other | Source: Ambulatory Visit | Attending: Plastic Surgery | Admitting: Plastic Surgery

## 2015-12-13 DIAGNOSIS — C50511 Malignant neoplasm of lower-outer quadrant of right female breast: Secondary | ICD-10-CM | POA: Diagnosis present

## 2015-12-13 DIAGNOSIS — Z803 Family history of malignant neoplasm of breast: Secondary | ICD-10-CM | POA: Diagnosis not present

## 2015-12-13 DIAGNOSIS — M1990 Unspecified osteoarthritis, unspecified site: Secondary | ICD-10-CM | POA: Diagnosis present

## 2015-12-13 DIAGNOSIS — K4090 Unilateral inguinal hernia, without obstruction or gangrene, not specified as recurrent: Secondary | ICD-10-CM | POA: Diagnosis present

## 2015-12-13 DIAGNOSIS — Z853 Personal history of malignant neoplasm of breast: Secondary | ICD-10-CM

## 2015-12-13 DIAGNOSIS — Z8249 Family history of ischemic heart disease and other diseases of the circulatory system: Secondary | ICD-10-CM

## 2015-12-13 DIAGNOSIS — C50911 Malignant neoplasm of unspecified site of right female breast: Secondary | ICD-10-CM | POA: Diagnosis present

## 2015-12-13 DIAGNOSIS — G43909 Migraine, unspecified, not intractable, without status migrainosus: Secondary | ICD-10-CM | POA: Diagnosis present

## 2015-12-13 DIAGNOSIS — Z823 Family history of stroke: Secondary | ICD-10-CM | POA: Diagnosis not present

## 2015-12-13 DIAGNOSIS — Z808 Family history of malignant neoplasm of other organs or systems: Secondary | ICD-10-CM

## 2015-12-13 HISTORY — PX: TOTAL MASTECTOMY: SHX6129

## 2015-12-13 HISTORY — PX: BREAST RECONSTRUCTION WITH PLACEMENT OF TISSUE EXPANDER AND FLEX HD (ACELLULAR HYDRATED DERMIS): SHX6295

## 2015-12-13 SURGERY — MASTECTOMY, SIMPLE
Anesthesia: General | Site: Breast | Laterality: Right

## 2015-12-13 MED ORDER — ROCURONIUM BROMIDE 50 MG/5ML IV SOLN
INTRAVENOUS | Status: AC
  Filled 2015-12-13: qty 1

## 2015-12-13 MED ORDER — LACTATED RINGERS IV SOLN
INTRAVENOUS | Status: DC | PRN
  Administered 2015-12-13 (×2): via INTRAVENOUS

## 2015-12-13 MED ORDER — PROPOFOL 10 MG/ML IV BOLUS
INTRAVENOUS | Status: AC
  Filled 2015-12-13: qty 20

## 2015-12-13 MED ORDER — FENTANYL CITRATE (PF) 100 MCG/2ML IJ SOLN
INTRAMUSCULAR | Status: AC
  Administered 2015-12-13: 50 ug
  Filled 2015-12-13: qty 2

## 2015-12-13 MED ORDER — LIDOCAINE HCL (CARDIAC) 20 MG/ML IV SOLN
INTRAVENOUS | Status: DC | PRN
  Administered 2015-12-13: 50 mg via INTRAVENOUS

## 2015-12-13 MED ORDER — PROPOFOL 10 MG/ML IV BOLUS
INTRAVENOUS | Status: DC | PRN
  Administered 2015-12-13: 40 mg via INTRAVENOUS
  Administered 2015-12-13: 80 mg via INTRAVENOUS

## 2015-12-13 MED ORDER — ROCURONIUM BROMIDE 100 MG/10ML IV SOLN
INTRAVENOUS | Status: DC | PRN
  Administered 2015-12-13: 35 mg via INTRAVENOUS
  Administered 2015-12-13: 15 mg via INTRAVENOUS
  Administered 2015-12-13 (×2): 5 mg via INTRAVENOUS
  Administered 2015-12-13: 10 mg via INTRAVENOUS

## 2015-12-13 MED ORDER — BUPIVACAINE-EPINEPHRINE (PF) 0.25% -1:200000 IJ SOLN
INTRAMUSCULAR | Status: AC
  Filled 2015-12-13: qty 60

## 2015-12-13 MED ORDER — STERILE WATER FOR INJECTION IJ SOLN
INTRAMUSCULAR | Status: AC
  Filled 2015-12-13: qty 10

## 2015-12-13 MED ORDER — CEFAZOLIN SODIUM 1-5 GM-% IV SOLN
1.0000 g | Freq: Four times a day (QID) | INTRAVENOUS | Status: DC
  Administered 2015-12-13 – 2015-12-15 (×7): 1 g via INTRAVENOUS
  Filled 2015-12-13 (×8): qty 50

## 2015-12-13 MED ORDER — ONDANSETRON HCL 4 MG/2ML IJ SOLN
INTRAMUSCULAR | Status: DC | PRN
  Administered 2015-12-13: 4 mg via INTRAVENOUS

## 2015-12-13 MED ORDER — SUGAMMADEX SODIUM 200 MG/2ML IV SOLN
INTRAVENOUS | Status: AC
  Filled 2015-12-13: qty 2

## 2015-12-13 MED ORDER — SUCCINYLCHOLINE CHLORIDE 20 MG/ML IJ SOLN
INTRAMUSCULAR | Status: AC
  Filled 2015-12-13: qty 1

## 2015-12-13 MED ORDER — DEXAMETHASONE SODIUM PHOSPHATE 4 MG/ML IJ SOLN
INTRAMUSCULAR | Status: AC
  Filled 2015-12-13: qty 2

## 2015-12-13 MED ORDER — METHOCARBAMOL 500 MG PO TABS
500.0000 mg | ORAL_TABLET | Freq: Four times a day (QID) | ORAL | Status: DC | PRN
  Administered 2015-12-14: 500 mg via ORAL
  Filled 2015-12-13: qty 1

## 2015-12-13 MED ORDER — HEPARIN SODIUM (PORCINE) 5000 UNIT/ML IJ SOLN
5000.0000 [IU] | Freq: Three times a day (TID) | INTRAMUSCULAR | Status: DC
  Administered 2015-12-14 – 2015-12-15 (×4): 5000 [IU] via SUBCUTANEOUS
  Filled 2015-12-13 (×3): qty 1

## 2015-12-13 MED ORDER — PROMETHAZINE HCL 25 MG/ML IJ SOLN: 6.2500 mg | INTRAMUSCULAR | Status: DC | PRN

## 2015-12-13 MED ORDER — HYDROMORPHONE HCL 1 MG/ML IJ SOLN: 0.2500 mg | INTRAMUSCULAR | Status: DC | PRN

## 2015-12-13 MED ORDER — SODIUM CHLORIDE 0.9 % IV SOLN
INTRAVENOUS | Status: AC
  Administered 2015-12-13: 1000 mL
  Filled 2015-12-13: qty 1

## 2015-12-13 MED ORDER — DEXAMETHASONE SODIUM PHOSPHATE 4 MG/ML IJ SOLN
INTRAMUSCULAR | Status: DC | PRN
  Administered 2015-12-13: 8 mg via INTRAVENOUS

## 2015-12-13 MED ORDER — PHENYLEPHRINE HCL 10 MG/ML IJ SOLN
10.0000 mg | INTRAVENOUS | Status: DC | PRN
  Administered 2015-12-13: 20 ug/min via INTRAVENOUS

## 2015-12-13 MED ORDER — EPHEDRINE SULFATE 50 MG/ML IJ SOLN
INTRAMUSCULAR | Status: DC | PRN
  Administered 2015-12-13 (×2): 5 mg via INTRAVENOUS
  Administered 2015-12-13: 10 mg via INTRAVENOUS
  Administered 2015-12-13 (×6): 5 mg via INTRAVENOUS

## 2015-12-13 MED ORDER — OXYCODONE-ACETAMINOPHEN 5-325 MG PO TABS
1.0000 | ORAL_TABLET | ORAL | Status: DC | PRN
  Administered 2015-12-13 – 2015-12-14 (×2): 1 via ORAL
  Filled 2015-12-13 (×2): qty 1

## 2015-12-13 MED ORDER — MIDAZOLAM HCL 2 MG/2ML IJ SOLN
INTRAMUSCULAR | Status: AC
  Administered 2015-12-13: 0.5 mg
  Filled 2015-12-13: qty 2

## 2015-12-13 MED ORDER — LIDOCAINE HCL (CARDIAC) 20 MG/ML IV SOLN
INTRAVENOUS | Status: AC
  Filled 2015-12-13: qty 5

## 2015-12-13 MED ORDER — FENTANYL CITRATE (PF) 250 MCG/5ML IJ SOLN
INTRAMUSCULAR | Status: AC
  Filled 2015-12-13: qty 5

## 2015-12-13 MED ORDER — FENTANYL CITRATE (PF) 100 MCG/2ML IJ SOLN
INTRAMUSCULAR | Status: DC | PRN
  Administered 2015-12-13 (×2): 50 ug via INTRAVENOUS

## 2015-12-13 MED ORDER — SODIUM CHLORIDE 0.9 % IR SOLN
Status: DC | PRN
  Administered 2015-12-13: 250 mL

## 2015-12-13 MED ORDER — EPHEDRINE SULFATE 50 MG/ML IJ SOLN
INTRAMUSCULAR | Status: AC
  Filled 2015-12-13: qty 1

## 2015-12-13 MED ORDER — ONDANSETRON HCL 4 MG/2ML IJ SOLN
INTRAMUSCULAR | Status: AC
  Filled 2015-12-13: qty 2

## 2015-12-13 MED ORDER — METOPROLOL SUCCINATE ER 50 MG PO TB24
50.0000 mg | ORAL_TABLET | Freq: Every day | ORAL | Status: DC
  Administered 2015-12-14: 50 mg via ORAL
  Filled 2015-12-13 (×2): qty 1

## 2015-12-13 MED ORDER — DOCUSATE SODIUM 100 MG PO CAPS
100.0000 mg | ORAL_CAPSULE | Freq: Every day | ORAL | Status: DC
  Administered 2015-12-13 – 2015-12-14 (×2): 100 mg via ORAL
  Filled 2015-12-13 (×3): qty 1

## 2015-12-13 MED ORDER — LACTATED RINGERS IV SOLN: INTRAVENOUS | Status: DC

## 2015-12-13 MED ORDER — SODIUM CHLORIDE 0.9 % IJ SOLN
INTRAMUSCULAR | Status: AC
  Filled 2015-12-13: qty 3

## 2015-12-13 MED ORDER — DEXTROSE-NACL 5-0.45 % IV SOLN
INTRAVENOUS | Status: DC
  Administered 2015-12-13 – 2015-12-14 (×3): via INTRAVENOUS

## 2015-12-13 MED ORDER — TAMOXIFEN CITRATE 10 MG PO TABS
20.0000 mg | ORAL_TABLET | Freq: Every day | ORAL | Status: DC
Start: 2015-12-13 — End: 2015-12-15
  Administered 2015-12-13 – 2015-12-14 (×2): 20 mg via ORAL
  Filled 2015-12-13 (×3): qty 2

## 2015-12-13 MED ORDER — SUGAMMADEX SODIUM 200 MG/2ML IV SOLN
INTRAVENOUS | Status: DC | PRN
  Administered 2015-12-13: 80 mg via INTRAVENOUS

## 2015-12-13 MED ORDER — PROMETHAZINE HCL 25 MG/ML IJ SOLN
6.2500 mg | INTRAMUSCULAR | Status: DC | PRN
  Administered 2015-12-13: 6.25 mg via INTRAVENOUS
  Filled 2015-12-13: qty 1

## 2015-12-13 MED ORDER — SODIUM CHLORIDE 0.9 % IR SOLN
Status: DC | PRN
  Administered 2015-12-13 (×3): 1000 mL

## 2015-12-13 SURGICAL SUPPLY — 97 items
ADH SKN CLS APL DERMABOND .7 (GAUZE/BANDAGES/DRESSINGS) ×1
ADH SKN CLS LQ APL DERMABOND (GAUZE/BANDAGES/DRESSINGS) ×1
APPLIER CLIP 9.375 MED OPEN (MISCELLANEOUS)
APR CLP MED 9.3 20 MLT OPN (MISCELLANEOUS)
ATCH SMKEVC FLXB CAUT HNDSWH (FILTER) ×2 IMPLANT
BAG DECANTER FOR FLEXI CONT (MISCELLANEOUS) ×5 IMPLANT
BINDER BREAST LRG (GAUZE/BANDAGES/DRESSINGS) IMPLANT
BINDER BREAST MEDIUM (GAUZE/BANDAGES/DRESSINGS) ×2 IMPLANT
BIOPATCH RED 1 DISK 7.0 (GAUZE/BANDAGES/DRESSINGS) ×4 IMPLANT
BIOPATCH RED 1IN DISK 7.0MM (GAUZE/BANDAGES/DRESSINGS) ×2
BLADE 10 SAFETY STRL DISP (BLADE) ×3 IMPLANT
CANISTER SUCTION 2500CC (MISCELLANEOUS) ×9 IMPLANT
CHLORAPREP W/TINT 26ML (MISCELLANEOUS) ×6 IMPLANT
CLIP APPLIE 9.375 MED OPEN (MISCELLANEOUS) IMPLANT
CLOSURE STERI-STRIP 1/2X4 (GAUZE/BANDAGES/DRESSINGS) ×1
CLOSURE WOUND 1/2 X4 (GAUZE/BANDAGES/DRESSINGS) ×1
CLSR STERI-STRIP ANTIMIC 1/2X4 (GAUZE/BANDAGES/DRESSINGS) ×1 IMPLANT
COVER LIGHT HANDLE  DEROYL (MISCELLANEOUS) ×2 IMPLANT
COVER SURGICAL LIGHT HANDLE (MISCELLANEOUS) ×6 IMPLANT
DERMABOND ADHESIVE PROPEN (GAUZE/BANDAGES/DRESSINGS) ×2
DERMABOND ADVANCED (GAUZE/BANDAGES/DRESSINGS) ×2
DERMABOND ADVANCED .7 DNX12 (GAUZE/BANDAGES/DRESSINGS) ×1 IMPLANT
DERMABOND ADVANCED .7 DNX6 (GAUZE/BANDAGES/DRESSINGS) IMPLANT
DRAIN CHANNEL 19F RND (DRAIN) ×6 IMPLANT
DRAPE CHEST BREAST 15X10 FENES (DRAPES) ×3 IMPLANT
DRAPE ORTHO SPLIT 77X108 STRL (DRAPES) ×6
DRAPE PROXIMA HALF (DRAPES) ×11 IMPLANT
DRAPE SURG 17X23 STRL (DRAPES) ×6 IMPLANT
DRAPE SURG ORHT 6 SPLT 77X108 (DRAPES) ×2 IMPLANT
DRAPE UTILITY XL STRL (DRAPES) ×6 IMPLANT
DRAPE WARM FLUID 44X44 (DRAPE) ×3 IMPLANT
DRSG PAD ABDOMINAL 8X10 ST (GAUZE/BANDAGES/DRESSINGS) ×7 IMPLANT
DRSG SORBAVIEW 3.5X5-5/16 MED (GAUZE/BANDAGES/DRESSINGS) ×6 IMPLANT
ELECT BLADE 4.0 EZ CLEAN MEGAD (MISCELLANEOUS) ×3
ELECT BLADE 6.5 EXT (BLADE) IMPLANT
ELECT CAUTERY BLADE 6.4 (BLADE) ×6 IMPLANT
ELECT REM PT RETURN 9FT ADLT (ELECTROSURGICAL) ×3
ELECTRODE BLDE 4.0 EZ CLN MEGD (MISCELLANEOUS) IMPLANT
ELECTRODE REM PT RTRN 9FT ADLT (ELECTROSURGICAL) ×3 IMPLANT
EVACUATOR SILICONE 100CC (DRAIN) ×6 IMPLANT
EVACUATOR SMOKE ACCUVAC VALLEY (FILTER) ×4
GAUZE SPONGE 4X4 12PLY STRL (GAUZE/BANDAGES/DRESSINGS) ×3 IMPLANT
GAUZE SPONGE 4X4 16PLY XRAY LF (GAUZE/BANDAGES/DRESSINGS) ×2 IMPLANT
GLOVE BIO SURGEON STRL SZ 6 (GLOVE) ×3 IMPLANT
GLOVE BIO SURGEON STRL SZ7 (GLOVE) ×2 IMPLANT
GLOVE BIO SURGEON STRL SZ7.5 (GLOVE) ×5 IMPLANT
GLOVE BIOGEL PI IND STRL 6.5 (GLOVE) ×1 IMPLANT
GLOVE BIOGEL PI IND STRL 7.0 (GLOVE) ×1 IMPLANT
GLOVE BIOGEL PI IND STRL 7.5 (GLOVE) IMPLANT
GLOVE BIOGEL PI IND STRL 8 (GLOVE) ×1 IMPLANT
GLOVE BIOGEL PI INDICATOR 6.5 (GLOVE) ×2
GLOVE BIOGEL PI INDICATOR 7.0 (GLOVE) ×2
GLOVE BIOGEL PI INDICATOR 7.5 (GLOVE) ×2
GLOVE BIOGEL PI INDICATOR 8 (GLOVE) ×2
GLOVE SURG SS PI 6.5 STRL IVOR (GLOVE) ×4 IMPLANT
GOWN STRL REUS W/ TWL LRG LVL3 (GOWN DISPOSABLE) ×2 IMPLANT
GOWN STRL REUS W/ TWL XL LVL3 (GOWN DISPOSABLE) ×1 IMPLANT
GOWN STRL REUS W/TWL 2XL LVL3 (GOWN DISPOSABLE) ×3 IMPLANT
GOWN STRL REUS W/TWL LRG LVL3 (GOWN DISPOSABLE) ×9
GOWN STRL REUS W/TWL XL LVL3 (GOWN DISPOSABLE) ×6
GRAFT FLEX HD 4X16 THICK (Tissue Mesh) ×2 IMPLANT
IMPL SILICONE HI PROF 250CC (Breast) IMPLANT
IMPLANT SILICONE HI PROF 250CC (Breast) ×3 IMPLANT
KIT BASIN OR (CUSTOM PROCEDURE TRAY) ×6 IMPLANT
KIT ROOM TURNOVER OR (KITS) ×4 IMPLANT
LIGHT WAVEGUIDE WIDE FLAT (MISCELLANEOUS) ×2 IMPLANT
LIQUID BAND (GAUZE/BANDAGES/DRESSINGS) ×3 IMPLANT
MARKER SKIN DUAL TIP RULER LAB (MISCELLANEOUS) ×3 IMPLANT
NDL SPNL 18GX3.5 QUINCKE PK (NEEDLE) ×1 IMPLANT
NEEDLE SPNL 18GX3.5 QUINCKE PK (NEEDLE) ×6 IMPLANT
NS IRRIG 1000ML POUR BTL (IV SOLUTION) ×9 IMPLANT
PACK GENERAL/GYN (CUSTOM PROCEDURE TRAY) ×6 IMPLANT
PAD ABD 8X10 STRL (GAUZE/BANDAGES/DRESSINGS) ×2 IMPLANT
PAD ARMBOARD 7.5X6 YLW CONV (MISCELLANEOUS) ×6 IMPLANT
PREFILTER EVAC NS 1 1/3-3/8IN (MISCELLANEOUS) ×6 IMPLANT
PREFILTER SMOKE EVAC (FILTER) ×3 IMPLANT
SPONGE LAP 18X18 X RAY DECT (DISPOSABLE) ×2 IMPLANT
STAPLER VISISTAT 35W (STAPLE) ×2 IMPLANT
STRIP CLOSURE SKIN 1/2X4 (GAUZE/BANDAGES/DRESSINGS) ×2 IMPLANT
SUT ETHILON 2 0 FS 18 (SUTURE) ×3 IMPLANT
SUT MNCRL AB 3-0 PS2 18 (SUTURE) ×12 IMPLANT
SUT MON AB 4-0 PC3 18 (SUTURE) ×3 IMPLANT
SUT PDS AB 3-0 SH 27 (SUTURE) ×6 IMPLANT
SUT PROLENE 3 0 PS 2 (SUTURE) ×6 IMPLANT
SUT PROLENE 4 0 SH DA (SUTURE) ×2 IMPLANT
SUT SILK 2 0 SH (SUTURE) ×2 IMPLANT
SUT VIC AB 3-0 SH 18 (SUTURE) ×3 IMPLANT
SUT VIC AB 3-0 SH 8-18 (SUTURE) ×5 IMPLANT
SYR 50ML LL SCALE MARK (SYRINGE) ×6 IMPLANT
SYR 50ML SLIP (SYRINGE) ×2 IMPLANT
SYR BULB IRRIGATION 50ML (SYRINGE) ×3 IMPLANT
TOWEL OR 17X24 6PK STRL BLUE (TOWEL DISPOSABLE) ×6 IMPLANT
TOWEL OR 17X26 10 PK STRL BLUE (TOWEL DISPOSABLE) ×6 IMPLANT
TRAY FOLEY CATH 16FR SILVER (SET/KITS/TRAYS/PACK) ×3 IMPLANT
TRAY FOLEY CATH SILVER 16FR (SET/KITS/TRAYS/PACK) ×2 IMPLANT
TUBE CONNECTING 12'X1/4 (SUCTIONS) ×2
TUBE CONNECTING 12X1/4 (SUCTIONS) ×4 IMPLANT

## 2015-12-13 NOTE — Anesthesia Postprocedure Evaluation (Signed)
Anesthesia Post Note  Patient: ESTEFANA OKABE  Procedure(s) Performed: Procedure(s) (LRB): RIGHT MASTECTOMY  (Right) RIGHT BREAST RECONSTRUCTION WITH PLACEMENT OF silicone gel implant and allograft tissue (Right)  Patient location during evaluation: PACU Anesthesia Type: General and Regional Level of consciousness: awake and alert, awake, patient cooperative and oriented Pain management: pain level controlled Vital Signs Assessment: post-procedure vital signs reviewed and stable Respiratory status: spontaneous breathing, nonlabored ventilation, respiratory function stable and patient connected to nasal cannula oxygen Cardiovascular status: blood pressure returned to baseline and stable Postop Assessment: no signs of nausea or vomiting Anesthetic complications: no Comments: GA + PEC Block. Comfortable in PACU.    Last Vitals:  Filed Vitals:   12/13/15 1347 12/13/15 1410  BP: 158/70   Pulse: 62   Temp:  36.1 C  Resp: 18     Last Pain:  Filed Vitals:   12/13/15 1410  PainSc: 0-No pain                 Catalina Gravel

## 2015-12-13 NOTE — Brief Op Note (Signed)
12/13/2015  1:06 PM  PATIENT:  Sarah Lewis  79 y.o. female  PRE-OPERATIVE DIAGNOSIS:  RIGHT BREAST CANCER  POST-OPERATIVE DIAGNOSIS:  RIGHT BREAST CANCER  PROCEDURE:  Procedure(s): RIGHT MASTECTOMY  (Right) RIGHT BREAST RECONSTRUCTION WITH PLACEMENT OF silicone gel implant and allograft tissue (Right)  SURGEON:  Surgeon(s) and Role: Panel 1:    * Stark Klein, MD - Primary  Panel 2:    * Crissie Reese, MD - Primary  PHYSICIAN ASSISTANT:   ASSISTANTS: none   ANESTHESIA:   general  EBL:  Total I/O In: 1700 [I.V.:1700] Out: 600 [Urine:400; Blood:200]  BLOOD ADMINISTERED:none  DRAINS: (2) Jackson-Pratt drain(s) with closed bulb suction in the right chest mastectomy space   LOCAL MEDICATIONS USED:  NONE  SPECIMEN:  Source of Specimen:  right chest skin  DISPOSITION OF SPECIMEN:  PATHOLOGY  COUNTS:  YES  TOURNIQUET:  * No tourniquets in log *  DICTATION: .Other Dictation: Dictation Number [id number]  PLAN OF CARE: Admit to inpatient   PATIENT DISPOSITION:  PACU - hemodynamically stable.   Delay start of Pharmacological VTE agent (>24hrs) due to surgical blood loss or risk of bleeding: no

## 2015-12-13 NOTE — Interval H&P Note (Signed)
History and Physical Interval Note:  12/13/2015 9:11 AM  Sarah Lewis  has presented today for surgery, with the diagnosis of RIGHT BREAST CANCER  The various methods of treatment have been discussed with the patient and family. After consideration of risks, benefits and other options for treatment, the patient has consented to  Procedure(s): RIGHT MASTECTOMY  (Right) RIGHT BREAST RECONSTRUCTION WITH PLACEMENT OF silicone gel implant OR TISSUE EXPLANDER AND POSSIBLE ADM (ACELLULAR DERMA MATRIX) (Right) as a surgical intervention .  The patient's history has been reviewed, patient examined, no change in status, stable for surgery.  I have reviewed the patient's chart and labs.  Questions were answered to the patient's satisfaction.     Verma Grothaus

## 2015-12-13 NOTE — Anesthesia Procedure Notes (Signed)
Procedure Name: Intubation Date/Time: 12/13/2015 9:31 AM Performed by: Garrison Columbus T Pre-anesthesia Checklist: Patient identified, Emergency Drugs available, Suction available and Patient being monitored Patient Re-evaluated:Patient Re-evaluated prior to inductionOxygen Delivery Method: Circle system utilized Preoxygenation: Pre-oxygenation with 100% oxygen Intubation Type: IV induction Ventilation: Mask ventilation without difficulty Laryngoscope Size: Miller and 2 Grade View: Grade II Tube type: Oral Tube size: 7.0 mm Number of attempts: 1 Airway Equipment and Method: Stylet Placement Confirmation: ETT inserted through vocal cords under direct vision,  positive ETCO2 and breath sounds checked- equal and bilateral Secured at: 20 cm Tube secured with: Tape Dental Injury: Teeth and Oropharynx as per pre-operative assessment

## 2015-12-13 NOTE — Op Note (Signed)
NAME:  Sarah Lewis, FANO NO.:  [id number]  MEDICAL RECORD NO.:  [id number]  LOCATION:  [id number]                        FACILITY:  [facility]  PHYSICIAN:  Crissie Reese, M.D.     DATE OF BIRTH:  [birth year]  DATE OF PROCEDURE:  12/13/2015 DATE OF DISCHARGE:                              OPERATIVE REPORT   PREOPERATIVE DIAGNOSIS:  Right breast cancer.  POSTOPERATIVE DIAGNOSIS:  Right breast cancer.  PROCEDURE PERFORMED: 1. Right breast reconstruction with a silicone gel implant. 2. Right chest wall reconstruction as a distinct procedure, using     acellular dermal matrix for inadequate muscle and reestablishment     of right inframammary fold.  SURGEON:  Crissie Reese, M.D.  ANESTHESIA:  General.  ESTIMATED BLOOD LOSS:  10 mL.  DRAINS:  Two 19-French.  CLINICAL NOTE:  This is a 79 year old woman, who has had a left breast cancer approximately 30 years ago and had reconstruction with implant and now presents with a right breast cancer and desires mastectomy and reconstruction with implant.  The procedure risks and possible complications were discussed with her in detail.  She understood the possible use of acellular dermal matrix and the possible need for as well as the nature of the tumor that material and the rationale for its use.  In addition, she understood that it was possible that the tissue expander would be used instead of an implant as a planned staged procedure.  She understood risks and possible complications included, but not limited to, bleeding, infection, healing problems, scarring, loss of sensation, fluid accumulations, anesthesia-related complications, pneumothorax, DVT, PE, failure of the device, capsular contracture, displacement of the device, wrinkles and ripples, asymmetry, contour deformities, contour deformities of the periphery reconstruction, chronic pain, and overall disappointment.  She understood all this and wished to  proceed.  DESCRIPTION OF PROCEDURE:  The patient was in the operating room and General surgery completed the right mastectomy.  The space was inspected and found to be in good condition as well as the skin flaps appeared to be viable.  The area was irrigated thoroughly with saline as well as antibiotic solution.  The pectoralis muscle was then elevated along serratus to anterior for short distance laterally.  Great care taken to avoid damage to underlying chest cavity.  Having done this, and created the submuscular space, it was noted there was an adequate muscle for coverage of the implant.  The implant selected was a [id number] mL silicone gel implant by mentor.  The specimen weighed 209 g.  The implant was soaked in antibiotic solution and the acellular dermal matrix was also soaked, first in saline for 10 minutes and then in antibiotic solution as well. It was thoroughly stripped of any residual fluid within it.  After thoroughly cleaning gloves, implant was positioned and the superior muscle closure with 3-0 Vicryl interrupted sutures, with great care taken to avoid damage to underlying implant, which was kept under direct vision all times and then the acellular dermal matrix for the inframammary fold and the lateral inferior aspect was secured with 3-0 PDS simple interrupted sutures, again with great care taken to avoid the underlying implant, which was kept under direct vision all  times.  Care were taken to make sure that the acellular dermal matrix was oriented with the dermal side facing upwards, toward the overlying mastectomy flap and the epidermal side, placing downward towards the implant.  Two 19-French drains positioned 1 medial and 1 lateral, brought two separate stab wounds and secured with 3-0 Prolene sutures.  Excellent hemostasis was achieved using the electrocautery and was confirmed and then the wound edges were then debrided and bright red bleeding was noted, consistent  viability and the skin was closed with 3-0 Monocryl interrupted inverted deep dermal sutures and a few interrupted 4-0 Prolene simple sutures.  Biopatch and SorbaView applied for the drains and Dermabond for the wound as well as the dry sterile dressing with a chest vest and she was transferred to recovery room stable having tolerated the procedure well.     Crissie Reese, M.D.     DB/MEDQ  D:  12/13/2015  T:  12/13/2015  Job:  [id number]

## 2015-12-13 NOTE — Op Note (Signed)
Right Skin Sparing Mastectomy   Indications: This patient presents with history of right breast cancer with clinically negative axillary lymph node exam.  Pre-operative Diagnosis: right breast cancer, [id number]  Post-operative Diagnosis: right breast cancer, same  Surgeon: Stark Klein   Anesthesia: General endotracheal anesthesia and Local anesthesia 0.25.% bupivacaine, with epinephrine  ASA Class: 3  Procedure Details  The patient was seen in the Holding Room. The risks, benefits, complications, treatment options, and expected outcomes were discussed with the patient. The possibilities of reaction to medication, pulmonary aspiration, bleeding, infection, the need for additional procedures, failure to diagnose a condition, and creating a complication requiring transfusion or operation were discussed with the patient. The patient concurred with the proposed plan, giving informed consent.  The site of surgery properly noted/marked. The patient was taken to Operating Room # 2, identified as Verne Seccombe and the procedure verified as Right Mastectomy. A Time Out was held and the above information confirmed.   After induction of anesthesia, the bilateral breast and chest were prepped and draped in standard fashion.   The borders of the breast were identified and marked.  A circumareolar incision was made around the nipple.  Saline was infiltrated into the superior flap.  Mastectomy hooks were used to provide elevation of the skin edges, and the curved Mayo scissors were used to create the mastectomy flaps.  The dissection was taken to the fascia of the pectoralis major.  The superior flap was taken medially to the lateral sternal border, superiorly to the inferior border of the clavicle.  The inferior flap was similarly created, inferiorly to the inframammary fold and laterally to the border of the latissimus.  The breast was taken off including the pectoralis fascia and the axillary tail marked.     The wound was irrigated.  Hemostasis was achieved with cautery.  The patient was left with Dr. Harlow Mares for reconstruction.    Findings: grossly clear surgical margins  Estimated Blood Loss:  less than 50 mL         Drains: per Dr. Harlow Mares.                  Specimens: R breast          Complications:  None; patient tolerated the procedure well.         Disposition: PACU - hemodynamically stable.         Condition: stable

## 2015-12-13 NOTE — Transfer of Care (Signed)
Immediate Anesthesia Transfer of Care Note  Patient: Sarah Lewis  Procedure(s) Performed: Procedure(s): RIGHT MASTECTOMY  (Right) RIGHT BREAST RECONSTRUCTION WITH PLACEMENT OF silicone gel implant and allograft tissue (Right)  Patient Location: PACU  Anesthesia Type:General and Regional  Level of Consciousness: awake, alert  and oriented  Airway & Oxygen Therapy: Patient Spontanous Breathing  Post-op Assessment: Report given to RN, Post -op Vital signs reviewed and stable and Patient moving all extremities X 4  Post vital signs: Reviewed and stable  Last Vitals:  Filed Vitals:   12/13/15 0707  BP: 193/91  Pulse: 66  Temp: 37 C  Resp: 16    Complications: No apparent anesthesia complications

## 2015-12-13 NOTE — H&P (Signed)
Allen Norris Location: [facility] Patient #: [id number] DOB: 1936/07/03 Undefined / Language: Cleophus Molt / Race: White Female   History of Present Illness  The patient is a 79 year old female who presents with breast cancer. Patient is a 79 yo female who presents with a palpable right breast mass. She was already scheduled for her annual mammogram, so she brought it to their attention when she showed up for her mammogram. Diagnostic imaging showed a 6 mm nodule at 6 o'clock. 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 This was ER/PR positive and her 2 negative. Ki67 was 5%.  She is a breast cancer survivor, presenting with a palpable mass on the left in 1979. She had a left MRM by Dr. Benard Rink followed by 18 months of chemotherapy. She had implant reconstruction and has not had any difficulty with her implant. Her father had bone cancer at age 13. Menarche occured at age 3. She had surgical menopause in 1982. She is a G2P2 with first child at age 5. She has not used hormonal contraception or HRT. Colonoscopy was 10 years ago. She is up to date with bone density.   She does not smoke or drink alcohol. She is a retired Pharmacist, hospital. She is here alone. She "told her husband to stay home" since she was much less anxious having gone through all this before. She desires mastectomy with reconstruction for symmetry.   Mammogram wtih breast density cat D. 6 mm irregular nodular density at 6 oclock. u/s shows lobulated nodule in the right breast.   Pathology Breast, right, needle core biopsy INVASIVE AND IN SITU DUCTAL CARCINOMA, GRADE 1-2, WITH MUCINOUS FEATURE  CBC, CMET essentially normal   Other Problems Arthritis Breast Cancer General anesthesia - complications High blood pressure Inguinal Hernia Lump In Breast Migraine Headache Oophorectomy Bilateral. Transfusion history  Past  Surgical History Appendectomy Breast Biopsy Bilateral. multiple Breast Reconstruction Left. Cesarean Section - Multiple Hysterectomy (not due to cancer) - Complete Laparoscopic Inguinal Hernia Surgery Right. Mastectomy Left. Oral Surgery Sentinel Lymph Node Biopsy Tonsillectomy  Diagnostic Studies History Colonoscopy 5-10 years ago Mammogram within last year Pap Smear >5 years ago  Medication History No Current Medications Medications Reconciled  Social History Caffeine use Coffee. No alcohol use Tobacco use Never smoker.  Family History Arthritis Mother. Breast Cancer Family Members In General, Sister. Cancer Family Members In General, Father. Cerebrovascular Accident Mother. Heart Disease Father. Hypertension Mother. Migraine Headache Mother. Prostate Cancer Father.  Pregnancy / Birth History Age at menarche 15 years. Age of menopause <47 Gravida 2 Irregular periods Maternal age 7-25 Para 2    Review of Systems General Not Present- Appetite Loss, Chills, Fatigue, Fever, Night Sweats, Weight Gain and Weight Loss. Skin Present- Change in Wart/Mole. Not Present- Dryness, Hives, Jaundice, New Lesions, Non-Healing Wounds, Rash and Ulcer. HEENT Present- Seasonal Allergies and Wears glasses/contact lenses. Not Present- Earache, Hearing Loss, Hoarseness, Nose Bleed, Oral Ulcers, Ringing in the Ears, Sinus Pain, Sore Throat, Visual Disturbances and Yellow Eyes. Respiratory Present- Chronic Cough and Wheezing. Not Present- Bloody sputum, Difficulty Breathing and Snoring. Breast Present- Breast Mass. Not Present- Breast Pain, Nipple Discharge and Skin Changes. Cardiovascular Present- Leg Cramps. Not Present- Chest Pain, Difficulty Breathing Lying Down, Palpitations, Rapid Heart Rate, Shortness of Breath and Swelling of Extremities. Gastrointestinal Not Present- Abdominal Pain, Bloating, Bloody Stool, Change in Bowel Habits, Chronic  diarrhea, Constipation, Difficulty Swallowing, Excessive gas, Gets full quickly at meals, Hemorrhoids,  Indigestion, Nausea, Rectal Pain and Vomiting. Musculoskeletal Not Present- Back Pain, Joint Pain, Joint Stiffness, Muscle Pain, Muscle Weakness and Swelling of Extremities. Neurological Not Present- Decreased Memory, Fainting, Headaches, Numbness, Seizures, Tingling, Tremor, Trouble walking and Weakness. Psychiatric Not Present- Anxiety, Bipolar, Change in Sleep Pattern, Depression, Fearful and Frequent crying. Endocrine Not Present- Cold Intolerance, Excessive Hunger, Hair Changes, Heat Intolerance, Hot flashes and New Diabetes. Hematology Present- Easy Bruising. Not Present- Excessive bleeding, Gland problems, HIV and Persistent Infections.  Vitals   Weight: 90.4 lb Height: 61in Body Surface Area: 1.35 m Body Mass Index: 17.08 kg/m  Temp.: 98.12F  Pulse: 71 (Regular)  Resp.: 18 (Unlabored)  BP: 178/81 (Sitting, Left Arm, Standard)       Physical Exam General Mental Status-Alert. General Appearance-Consistent with stated age. Hydration-Well hydrated. Voice-Normal.  Head and Neck Head-normocephalic, atraumatic with no lesions or palpable masses. Trachea-midline. Thyroid Gland Characteristics - normal size and consistency.  Eye Eyeball - Bilateral-Extraocular movements intact. Sclera/Conjunctiva - Bilateral-No scleral icterus.  Chest and Lung Exam Chest and lung exam reveals -quiet, even and easy respiratory effort with no use of accessory muscles and on auscultation, normal breath sounds, no adventitious sounds and normal vocal resonance. Inspection Chest Wall - Normal. Back - normal.  Breast Note: left breast surgically reconstructed. No chest wall masses. no LAD. Right breast wtih palpable 5 mm mass at 6 o'clock. This is near the inframammary fold. She has significant bruising at the area of the  biopsy.   Cardiovascular Cardiovascular examination reveals -normal heart sounds, regular rate and rhythm with no murmurs and normal pedal pulses bilaterally.  Abdomen Inspection Inspection of the abdomen reveals - No Hernias. Palpation/Percussion Palpation and Percussion of the abdomen reveal - Soft, Non Tender, No Rebound tenderness, No Rigidity (guarding) and No hepatosplenomegaly. Auscultation Auscultation of the abdomen reveals - Bowel sounds normal.  Neurologic Neurologic evaluation reveals -alert and oriented x 3 with no impairment of recent or remote memory. Mental Status-Normal.  Musculoskeletal Global Assessment -Note: no gross deformities.  Normal Exam - Left-Upper Extremity Strength Normal and Lower Extremity Strength Normal. Normal Exam - Right-Upper Extremity Strength Normal and Lower Extremity Strength Normal.  Lymphatic Head & Neck  General Head & Neck Lymphatics: Bilateral - Description - Normal. Axillary  General Axillary Region: Bilateral - Description - Normal. Tenderness - Non Tender. Femoral & Inguinal  Generalized Femoral & Inguinal Lymphatics: Bilateral - Description - No Generalized lymphadenopathy.    Assessment & Plan  PRIMARY CANCER OF LOWER OUTER QUADRANT OF RIGHT FEMALE BREAST (C50.511) Impression: Pt has a new right cT1bN0 breast cancer, hormone positive. She would like to pursue right mastectomy with reconstruction. She may be a candidate for nipple sparing mastectomy. She will possibly not need a sentinel node biopsy since she is unlikely to have positivity based on tumor biology and size of mass. She also would not get IV chemotherapy if is were positive. I will refer her to plastics to consider mastectomy with immediate reconstruction.  I discussed risks of surgery including bleeding, infection, damage to adjacent structures, pain, cancer recurrence, asymmetry, dissatisfaction with appearance, possible need for additional  procedures or surgeries. She is extremely unlikely to get radiation as well with mastectomy.  She will also be referred to genetics since she has now had a bilateral cancer.  45 min spent in evaluation, examination, counseling, and coordination of care. >50% spent in counseling. Current Plans You are being scheduled for surgery - Our schedulers will call you.  You should hear from  our office's scheduling department within 5 working days about the location, date, and time of surgery. We try to make accommodations for patient's preferences in scheduling surgery, but sometimes the OR schedule or the surgeon's schedule prevents [place] from making those accommodations.  If you have not heard from our office [phone]) in 5 working days, call the office and ask for your surgeon's nurse.  If you have other questions about your diagnosis, plan, or surgery, call the office and ask for your surgeon's nurse.  Pt Education - flb breast cancer surgery: discussed with patient and provided information. Referred to Surgery - Plastic, for evaluation and follow up (Plastic Surgery). Routine.   Signed by Stark Klein, MD

## 2015-12-13 NOTE — Anesthesia Preprocedure Evaluation (Addendum)
Anesthesia Evaluation  Patient identified by MRN, date of birth, ID band Patient awake    Reviewed: Allergy & Precautions, NPO status , Patient's Chart, lab work & pertinent test results  History of Anesthesia Complications (+) PONV  Airway Mallampati: II  TM Distance: >3 FB Neck ROM: Full    Dental  (+) Teeth Intact, Dental Advisory Given   Pulmonary COPD,    breath sounds clear to auscultation       Cardiovascular hypertension, + Valvular Problems/Murmurs AI  Rhythm:Irregular Rate:Normal + Systolic murmurs    Neuro/Psych    GI/Hepatic Neg liver ROS,   Endo/Other  negative endocrine ROS  Renal/GU negative Renal ROS     Musculoskeletal   Abdominal   Peds  Hematology negative hematology ROS (+)   Anesthesia Other Findings   Reproductive/Obstetrics                            Anesthesia Physical Anesthesia Plan  ASA: III  Anesthesia Plan: General   Post-op Pain Management: GA combined w/ Regional for post-op pain   Induction:   Airway Management Planned: Oral ETT  Additional Equipment:   Intra-op Plan:   Post-operative Plan: Extubation in OR  Informed Consent: I have reviewed the patients History and Physical, chart, labs and discussed the procedure including the risks, benefits and alternatives for the proposed anesthesia with the patient or authorized representative who has indicated his/her understanding and acceptance.     Plan Discussed with: CRNA and Surgeon  Anesthesia Plan Comments:         Anesthesia Quick Evaluation

## 2015-12-14 ENCOUNTER — Encounter (HOSPITAL_COMMUNITY): Payer: Self-pay | Admitting: General Surgery

## 2015-12-14 NOTE — Progress Notes (Signed)
Subjective: Sore but good pain control. Some nausea. Not taking fluids well yet.  Objective: Vital signs in last 24 hours: Temp:  [97 F (36.1 C)-98.2 F (36.8 C)] 98.2 F (36.8 C) (12/28 0506) Pulse Rate:  [62-73] 71 (12/28 0506) Resp:  [16-25] 16 (12/28 0506) BP: (127-164)/(59-88) 127/59 mmHg (12/28 0506) SpO2:  [95 %-98 %] 95 % (12/28 0506)  Intake/Output from previous day: 12/27 0701 - 12/28 0700 In: 3371.3 [P.O.:360; I.V.:2911.3; IV Piggyback:100] Out: 2348 [Urine:1800; Drains:348; Blood:200] Intake/Output this shift:    Operative sites: Mastectomy flaps viable. Implant appears to be in good osition. Drains functioning. Drainage thin. No evidence of bleeding or infection.  No results for input(s): WBC, HGB, HCT, NA, K, CL, CO2, BUN, CREATININE, GLU in the last 72 hours.  Invalid input(s): PLATELETS  Studies/Results: No results found.  Assessment/Plan: Ambulate. Not ready for discharge until better PO intake.   LOS: 1 day    Sarah Lewis M 12/14/2015 7:19 AM

## 2015-12-14 NOTE — Progress Notes (Signed)
1 Day Post-Op  Subjective: Doing well this AM.  Had nausea last night, but none this AM.  Pain is controlled.  Pt has voided.    Objective: Vital signs in last 24 hours: Temp:  [97 F (36.1 C)-98.2 F (36.8 C)] 98.2 F (36.8 C) (12/28 0506) Pulse Rate:  [62-73] 71 (12/28 0506) Resp:  [16-25] 16 (12/28 0506) BP: (127-164)/(59-88) 127/59 mmHg (12/28 0506) SpO2:  [95 %-98 %] 95 % (12/28 0506) Last BM Date: 12/13/15  Intake/Output from previous day: 12/27 0701 - 12/28 0700 In: 3371.3 [P.O.:360; I.V.:2911.3; IV Piggyback:100] Out: 2348 [Urine:1800; Drains:348; Blood:200] Intake/Output this shift:    General appearance: alert, cooperative and no distress Resp: clear to auscultation Chest wall: anticipated right sided tenderness.  No evidence of ischemia to skin flaps or hematoma.  Lab Results:  No results for input(s): WBC, HGB, HCT, PLT in the last 72 hours. BMET No results for input(s): NA, K, CL, CO2, GLUCOSE, BUN, CREATININE, CALCIUM in the last 72 hours. PT/INR No results for input(s): LABPROT, INR in the last 72 hours. ABG No results for input(s): PHART, HCO3 in the last 72 hours.  Invalid input(s): PCO2, PO2  Studies/Results: No results found.  Anti-infectives: Anti-infectives    Start     Dose/Rate Route Frequency Ordered Stop   12/13/15 1800  ceFAZolin (ANCEF) IVPB 1 g/50 mL premix     1 g 100 mL/hr over 30 Minutes Intravenous 4 times per day 12/13/15 1531     12/13/15 0830  ceFAZolin (ANCEF) IVPB 2 g/50 mL premix     2 g 100 mL/hr over 30 Minutes Intravenous To [facility]Stay Surgical 12/12/15 1121 12/13/15 1000   12/13/15 0815  bacitracin 50,000 Units, gentamicin (GARAMYCIN) 80 mg, ceFAZolin (ANCEF) 1 g in sodium chloride 0.9 % 1,000 mL      Irrigation To Surgery 12/13/15 0801 12/13/15 0855      Assessment/Plan: s/p Procedure(s): RIGHT MASTECTOMY  (Right) RIGHT BREAST RECONSTRUCTION WITH PLACEMENT OF silicone gel implant and allograft tissue (Right) Advance  diet Make sure adequate PO intake.  D/c when taking better PO.   LOS: 1 day    [facility] 12/14/2015

## 2015-12-15 MED ORDER — OXYCODONE-ACETAMINOPHEN 5-325 MG PO TABS: 1.0000 | ORAL_TABLET | ORAL | Status: DC | PRN

## 2015-12-15 MED ORDER — DOXYCYCLINE HYCLATE 100 MG PO TABS
100.0000 mg | ORAL_TABLET | Freq: Two times a day (BID) | ORAL | Status: DC
  Administered 2015-12-15: 100 mg via ORAL
  Filled 2015-12-15: qty 1

## 2015-12-15 MED ORDER — DOXYCYCLINE HYCLATE 100 MG PO TABS: 100.0000 mg | ORAL_TABLET | Freq: Two times a day (BID) | ORAL | Status: DC

## 2015-12-15 MED ORDER — DOCUSATE SODIUM 100 MG PO CAPS: 100.0000 mg | ORAL_CAPSULE | Freq: Every day | ORAL | Status: DC

## 2015-12-15 NOTE — Discharge Instructions (Addendum)
No lifting for 6 weeks No vigorous activity for 6 weeks (including outdoor walks) No driving for 4 weeks OK to walk up stairs slowly Stay propped up Use incentive spirometer at home every hour while awake No shower while drains are in place Empty drains at least three times a day and record the amounts separately Change drain dressings every third day if instructed to do so by Dr. Harlow Mares (No need to start this until after your return to the office)  Apply Bacitracin antibiotic ointment to the drain sites  Place gauze dressing over drains  Secure the gauze with tape Take an over-the-counter Probiotic while on antibiotics Take an over-the-counter stool softener (such as Colace) while on pain medication See Dr. Harlow Mares next week in office For questions call [phone] or [phone]

## 2015-12-15 NOTE — Progress Notes (Signed)
Pt discharged to home.  Discharge instructions explained to pt.  Pt educated on how to empty JP drain and document on sheet.  Pt states she has all belongings.  IV's removed.  Pt taken off unit via wheelchair by volunteer services.

## 2015-12-15 NOTE — Discharge Summary (Signed)
Physician Discharge Summary  Patient ID: Sarah Lewis MRN: [id number] DOB/AGE: 05-27-1936 79 y.o.  Admit date: 12/13/2015 Discharge date: 12/15/2015  Admission Diagnoses: Right breast cancer  Discharge Diagnoses: Same Active Problems:   Breast cancer, right breast [facility])   Discharged Condition: good  Hospital Course: On the day of admission the patient was taken to surgery and had right mastectomy with sentinel node and reconstruction with silicone gel implant and ADM. The patient tolerated the procedures well. Postoperatively, the mastectomy flaps maintained excellent color and capillary refill. The patient was ambulatory and tolerating diet somewhat on the first postoperative day. PO intake improved markedly on day 2 and she is ready to go home.  Treatments: antibiotics: Ancef, anticoagulation: heparin and surgery: right mastectomy and immediate breast reconstruction with silicone gel implant  Discharge Exam: Blood pressure 160/72, pulse 62, temperature 98.3 F (36.8 C), temperature source Oral, resp. rate 18, height 5\' 1"  (1.549 m), weight 92 lb (41.731 kg), SpO2 98 %.  Operative sites: Mastectomy flaps viable. Implant is in good position. Drains functioning. Drainage thin. No evidence of bleeding or infection or healing problem.  Disposition:      Medication List    STOP taking these medications        aspirin 81 MG tablet     chlorpheniramine-HYDROcodone 10-8 MG/5ML Suer  Commonly known as:  TUSSIONEX     cholecalciferol 1000 units tablet  Commonly known as:  VITAMIN D      TAKE these medications        calcium-vitamin D 500-200 MG-UNIT tablet  Commonly known as:  OSCAL WITH D  Take 2 tablets by mouth daily.     docusate sodium 100 MG capsule  Commonly known as:  COLACE  Take 1 capsule (100 mg total) by mouth daily.     doxycycline 100 MG tablet  Commonly known as:  VIBRA-TABS  Take 1 tablet (100 mg total) by mouth every 12 (twelve) hours.     metoprolol succinate 50 MG 24 hr tablet  Commonly known as:  TOPROL-XL  Take 50 mg by mouth daily.     oxyCODONE-acetaminophen 5-325 MG tablet  Commonly known as:  PERCOCET/ROXICET  Take 1-2 tablets by mouth every 4 (four) hours as needed for moderate pain or severe pain.     tamoxifen 20 MG tablet  Commonly known as:  NOLVADEX  Take 1 tablet (20 mg total) by mouth daily.           Follow-up Information    Follow up with [facility], [place] In 2 weeks.   Specialty:  General Surgery   Contact information:   [address] [place] [address] [phone]       Signed: Macon Large 12/15/2015, 7:22 AM

## 2015-12-16 NOTE — Progress Notes (Signed)
Quick Note:  Please let patient know margins are all negative and that total cancer size was only 9 mm. ______

## 2016-01-10 ENCOUNTER — Other Ambulatory Visit: Payer: Self-pay | Admitting: *Deleted

## 2016-01-10 ENCOUNTER — Telehealth: Payer: Self-pay | Admitting: Oncology

## 2016-01-10 ENCOUNTER — Ambulatory Visit (HOSPITAL_BASED_OUTPATIENT_CLINIC_OR_DEPARTMENT_OTHER): Payer: Medicare Other | Admitting: Oncology

## 2016-01-10 VITALS — BP 167/91 | HR 65 | Temp 98.2°F | Resp 18 | Ht 61.0 in | Wt 90.8 lb

## 2016-01-10 DIAGNOSIS — C50811 Malignant neoplasm of overlapping sites of right female breast: Secondary | ICD-10-CM

## 2016-01-10 DIAGNOSIS — Z79810 Long term (current) use of selective estrogen receptor modulators (SERMs): Secondary | ICD-10-CM | POA: Diagnosis not present

## 2016-01-10 DIAGNOSIS — M810 Age-related osteoporosis without current pathological fracture: Secondary | ICD-10-CM

## 2016-01-10 DIAGNOSIS — C50511 Malignant neoplasm of lower-outer quadrant of right female breast: Secondary | ICD-10-CM

## 2016-01-10 DIAGNOSIS — Z170 Estrogen receptor positive status [ER+]: Secondary | ICD-10-CM

## 2016-01-10 NOTE — Progress Notes (Signed)
[place]  [phone] [phone]     ID: Sarah Lewis DOB: 1936/12/12  MR#: [id number]  JYN#:[id number]  Patient Care Team: Carol Ada, MD as PCP - General (Family Medicine) Stark Klein, MD as Consulting Physician (General Surgery) Chauncey Cruel, MD as Consulting Physician (Oncology) Kyung Rudd, MD as Consulting Physician (Radiation Oncology) Mauro Kaufmann, RN as Registered Nurse Rockwell Germany, RN as Registered Nurse Sylvan Cheese, NP as Nurse Practitioner (Hematology and Oncology) PCP: Reginia Naas, MD OTHER MD: Jacelyn Pi MD, Allyn Kenner MD  CHIEF COMPLAINT: Estrogen receptor positive breast cancer  CURRENT TREATMENT: tamoxifen  BREAST CANCER HISTORY:  From the original intake note:  Sarah Lewis tells me she found her left breast cancer in 1979 GERD "they were not doing mammograms then". She brought it to medical attention and Dr. Loralee Pacas did her left mastectomy and axillary lymph node dissection. She was then seen by Dr. Nicholes Stairs for medical oncology and received chemotherapy for 18 months (almost certainly this was CMF given every 3 weeks).  More recently she again herself felt a bump, in the underside of her right breast this time. Since she was scheduled for mammography later the same week, she simply mentioned it at the time of mammography, so that on 10/03/2015 she underwent bilateral diagnostic mammography with tomosynthesis at [facility]. The breast density was category D. In the right breast at the 6:00 position there was an irregular nodular density which by ultrasonography the same day was lobulated and measured 6 mm.  On 10/06/2015 she underwent ultrasound-guided biopsy of the right breast mass in question, and this found ([id number]) an invasive ductal carcinoma, grade 1 or 2, as well as in situ ductal carcinoma. The invasive tumor was estrogen receptor positive at 100%, progesterone receptor  positive at 95%, both with strong staining intensity, with an MIB-1 of 5%, and no HER-2 amplification, the signals ratio being 1.10 and the number per cell 2.15.  The patient's subsequent history is as detailed below  INTERVAL HISTORY: Sarah Lewis returns today for follow-up of her estrogen receptor positive breast cancer. Since her last visit here she underwent definitive right mastectomy, with no sentinel lymph node sampling, 12/13/2015. The final pathology [id number]) showed a 0.9 cm invasive ductal carcinoma, grade 1,with extracellular mucin, ample margins, the closest being 0.6 cm, and HER-2 again negative with a signals ratio of 1.38 and the number per cell 2.00. She had immediate reconstruction with a silicone implant and she also had chest wall reconstruction with an acellular dermal matrix to reestablish the right inframammary fold.  REVIEW OF SYSTEMS: Sarah Lewis tolerated the surgery well. She kept the drains for a week, which of course was uncomfortable, but otherwise is currently having no pain never had any fever didn't have any significant bleeding. She still a little restricted in her activities but is hoping Dr. Harlow Mares will release her after 3 more weeks. Aside from these issues a detailed review of systems today was entirely benign  PAST MEDICAL HISTORY: Past Medical History  Diagnosis Date  . Osteoporosis   . Hypertension   . Cough     from toporol  . Breast cancer of lower-outer quadrant of right female breast ([place]) 10/10/2015  . Osteoporosis   . PONV (postoperative nausea and vomiting)   . Spinal headache   . Heart murmur     PAST SURGICAL HISTORY: Past Surgical History  Procedure Laterality Date  . Cesarean section    . Abdominal hysterectomy    .  Hernia repair    . Mastectomy Left 1979  . Breast surgery    . Tonsillectomy    . Appendectomy    . Total mastectomy Right 12/13/2015    Procedure: RIGHT MASTECTOMY ;  Surgeon: Stark Klein, MD;  Location: [facility];  Service:  General;  Laterality: Right;  . Breast reconstruction with placement of tissue expander and flex hd (acellular hydrated dermis) Right 12/13/2015    Procedure: RIGHT BREAST RECONSTRUCTION WITH PLACEMENT OF silicone gel implant and allograft tissue;  Surgeon: Crissie Reese, MD;  Location: [facility];  Service: Plastics;  Laterality: Right;    FAMILY HISTORY Family History  Problem Relation Age of Onset  . Bone cancer Father    the patient's father had metastatic prostate cancer but died at 62 from heart disease. The patient's mother died from a clot to her lung at the age of 75, 3 months after the patient's father died. Sarah Lewis had no brothers, one sister. There is no other history of breast or ovarian cancer in the family.   GYNECOLOGIC HISTORY:  No LMP recorded. Patient is postmenopausal.  menarche age 55, first live birth age 15. The patient is GX P2. She underwent hysterectomy with bilateral salpingo-oophorectomy in 1982. She did not take hormone replacement. She never took oral contraceptives.   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 Her husband [name] is a retired Medical illustrator. Daughter [name] The patient is a Tourist information centre manager.    ADVANCED DIRECTIVES: In place    HEALTH MAINTENANCE: Social History  Substance Use Topics  . Smoking status: Never Smoker   . Smokeless tobacco: Never Used  . Alcohol Use: No     Colonoscopy: 2006   PAP:  Bone density: osteoporosis   Lipid panel:  No Known Allergies  Current Outpatient Prescriptions  Medication Sig Dispense Refill  . calcium-vitamin D (OSCAL WITH D) 500-200 MG-UNIT per tablet Take 2 tablets by mouth daily.    [name] Kitchen docusate sodium (COLACE) 100 MG capsule Take 1 capsule (100 mg total) by mouth daily. 10 capsule 0  . doxycycline (VIBRA-TABS) 100 MG tablet Take 1  tablet (100 mg total) by mouth every 12 (twelve) hours. 30 tablet 0  . metoprolol succinate (TOPROL-XL) 50 MG 24 hr tablet Take 50 mg by mouth daily.   0  . oxyCODONE-acetaminophen (PERCOCET/ROXICET) 5-325 MG tablet Take 1-2 tablets by mouth every 4 (four) hours as needed for moderate pain or severe pain. 30 tablet 0  . tamoxifen (NOLVADEX) 20 MG tablet Take 1 tablet (20 mg total) by mouth daily. 90 tablet 12   No current facility-administered medications for this visit.    OBJECTIVE: older white woman In no acute distress  Filed Vitals:   01/10/16 1325  BP: 167/91  Pulse: 65  Temp: 98.2 F (36.8 C)  Resp: 18     Body mass index is 17.17 kg/(m^2).    ECOG FS:0 - Asymptomatic  Sclerae unicteric, pupils round and equal Oropharynx clear and moist-- no thrush or other lesions No cervical or supraclavicular adenopathy Lungs no rales or rhonchi Heart regular rate and rhythm Abd soft, nontender, positive bowel sounds MSK no focal spinal tenderness, no upper extremity lymphedema Neuro: nonfocal, well oriented, appropriate affect Breasts: status post bilateral mastectomies with bilateral silicone implant. On the right, the more recent surgery, there is no  erythema or evidence of dehiscence. The right axilla is benign. On the left, the more remote surgery, she also had extensive lymphadenectomy. There is no evidence of local recurrence    LAB RESULTS:  CMP     Component Value Date/Time   NA 139 12/07/2015 1052   NA 140 10/12/2015 1229   K 3.9 12/07/2015 1052   K 4.1 10/12/2015 1229   CL 101 12/07/2015 1052   CO2 30 12/07/2015 1052   CO2 28 10/12/2015 1229   GLUCOSE 103* 12/07/2015 1052   GLUCOSE 92 10/12/2015 1229   BUN 12 12/07/2015 1052   BUN 13.9 10/12/2015 1229   CREATININE 0.75 12/07/2015 1052   CREATININE 0.8 10/12/2015 1229   CALCIUM 9.0 12/07/2015 1052   CALCIUM 9.6 10/12/2015 1229   PROT 7.2 10/12/2015 1229   ALBUMIN 4.2 10/12/2015 1229   AST 21 10/12/2015 1229    ALT 17 10/12/2015 1229   ALKPHOS 65 10/12/2015 1229   BILITOT 0.55 10/12/2015 1229   GFRNONAA >60 12/07/2015 1052   GFRAA >60 12/07/2015 1052    INo results found for: SPEP, UPEP  Lab Results  Component Value Date   WBC 7.3 12/07/2015   NEUTROABS 4.0 10/12/2015   HGB 13.8 12/07/2015   HCT 41.4 12/07/2015   MCV 92.2 12/07/2015   PLT 242 12/07/2015      Chemistry      Component Value Date/Time   NA 139 12/07/2015 1052   NA 140 10/12/2015 1229   K 3.9 12/07/2015 1052   K 4.1 10/12/2015 1229   CL 101 12/07/2015 1052   CO2 30 12/07/2015 1052   CO2 28 10/12/2015 1229   BUN 12 12/07/2015 1052   BUN 13.9 10/12/2015 1229   CREATININE 0.75 12/07/2015 1052   CREATININE 0.8 10/12/2015 1229      Component Value Date/Time   CALCIUM 9.0 12/07/2015 1052   CALCIUM 9.6 10/12/2015 1229   ALKPHOS 65 10/12/2015 1229   AST 21 10/12/2015 1229   ALT 17 10/12/2015 1229   BILITOT 0.55 10/12/2015 1229       No results found for: LABCA2  No components found for: LABCA125  No results for input(s): INR in the last 168 hours.  Urinalysis No results found for: COLORURINE, APPEARANCEUR, LABSPEC, PHURINE, GLUCOSEU, HGBUR, BILIRUBINUR, KETONESUR, PROTEINUR, UROBILINOGEN, NITRITE, LEUKOCYTESUR  STUDIES: No results found.  ASSESSMENT: 80 y.o. [place] woman  (1) status post left mastectomy and neck were lymph node dissection with silicone implant reconstruction in 1979 for lymph node positive breast cancer treated adjuvantly with chemotherapy (likely CMF) for 18 months  (2) status post right breast biopsy 10/06/2015 for a clinical T1b N0, stage IA invasive ductal carcinoma, grade 1 or 2, with mucinous features; estrogen and progesterone receptor positive  (3) status post right mastectomy 12/12/2025 for a pT1b cN0, stage IA invasive ductal carcinoma, grade 1, repeat HER-2 again negative. Margins were ample  (4) adjuvant radiation not indicated  (5) continuing tamoxifen to total 5  years  (6) osteoporosis, on zolendronate   PLAN: Dominga did great with her surgery. Because she had a small low-grade tumor with ample margins and no evidence of lymph node involvement there is no indication for postmastectomy radiation in her case.  I quoted her a risk of recurrence if all she does cyst surgery in the 12% range. She can cut this in half by taking tamoxifen for 5 years. Obviously this is a small benefit: Only 6 out of 100 women benefiting. However she is tolerating  the drug with no side effects whatsoever and she obtains it at a very good price.  What really has convinced her to stay on tamoxifen is the fact that this should help with her osteoporosis problem.  She understands she will not need mammography or breast MRI in the future in the absence of specific findings to further evaluate.  Accordingly the plan is for her to take tamoxifen for total of 5 years. She is going to return to see me in May just to make sure that she is doing well, and if so I will probably start seeing her on a once a year basis until she completes her 5 years of follow-up.  She has a good understanding of this plan. She agrees with it. She knows a goal of treatment in her case is cure. She will call with any problems that may develop before her next visit here.  Chauncey Cruel, MD   01/10/2016 1:34 PM Medical Oncology and Hematology [facility] [address] [place], [place] [address] Tel. [phone]    Fax. [phone]

## 2016-01-10 NOTE — Telephone Encounter (Signed)
Appointments made and avs printed °

## 2016-01-12 ENCOUNTER — Telehealth: Payer: Self-pay | Admitting: Oncology

## 2016-01-12 NOTE — Telephone Encounter (Signed)
Spoke with patient about survivorship program per 1/24 pof and she is not interested in the program at the moment.

## 2016-01-26 DIAGNOSIS — M810 Age-related osteoporosis without current pathological fracture: Secondary | ICD-10-CM | POA: Diagnosis present

## 2016-02-28 ENCOUNTER — Encounter: Payer: Medicare Other | Admitting: Nurse Practitioner

## 2016-04-16 ENCOUNTER — Other Ambulatory Visit: Payer: Medicare Other

## 2016-04-23 ENCOUNTER — Ambulatory Visit: Payer: Medicare Other | Admitting: Oncology

## 2016-04-24 ENCOUNTER — Ambulatory Visit: Payer: Medicare Other | Admitting: Oncology

## 2016-04-30 ENCOUNTER — Other Ambulatory Visit (HOSPITAL_BASED_OUTPATIENT_CLINIC_OR_DEPARTMENT_OTHER): Payer: Medicare Other

## 2016-04-30 DIAGNOSIS — C50511 Malignant neoplasm of lower-outer quadrant of right female breast: Secondary | ICD-10-CM

## 2016-04-30 DIAGNOSIS — C50811 Malignant neoplasm of overlapping sites of right female breast: Secondary | ICD-10-CM | POA: Diagnosis not present

## 2016-04-30 LAB — CBC WITH DIFFERENTIAL/PLATELET
BASO%: 1 % (ref 0.0–2.0)
BASOS ABS: 0.1 10*3/uL (ref 0.0–0.1)
EOS ABS: 0.1 10*3/uL (ref 0.0–0.5)
EOS%: 2.2 % (ref 0.0–7.0)
HEMATOCRIT: 38.4 % (ref 34.8–46.6)
HEMOGLOBIN: 12.9 g/dL (ref 11.6–15.9)
LYMPH#: 1.2 10*3/uL (ref 0.9–3.3)
LYMPH%: 24 % (ref 14.0–49.7)
MCH: 30.6 pg (ref 25.1–34.0)
MCHC: 33.6 g/dL (ref 31.5–36.0)
MCV: 91.2 fL (ref 79.5–101.0)
MONO#: 0.4 10*3/uL (ref 0.1–0.9)
MONO%: 6.9 % (ref 0.0–14.0)
NEUT%: 65.9 % (ref 38.4–76.8)
NEUTROS ABS: 3.3 10*3/uL (ref 1.5–6.5)
PLATELETS: 217 10*3/uL (ref 145–400)
RBC: 4.21 10*6/uL (ref 3.70–5.45)
RDW: 12.8 % (ref 11.2–14.5)
WBC: 5.1 10*3/uL (ref 3.9–10.3)

## 2016-04-30 LAB — COMPREHENSIVE METABOLIC PANEL
ALBUMIN: 3.7 g/dL (ref 3.5–5.0)
ALK PHOS: 42 U/L (ref 40–150)
ALT: 14 U/L (ref 0–55)
ANION GAP: 5 meq/L (ref 3–11)
AST: 19 U/L (ref 5–34)
BILIRUBIN TOTAL: 0.53 mg/dL (ref 0.20–1.20)
BUN: 11.1 mg/dL (ref 7.0–26.0)
CALCIUM: 9.4 mg/dL (ref 8.4–10.4)
CO2: 30 mEq/L — ABNORMAL HIGH (ref 22–29)
Chloride: 103 mEq/L (ref 98–109)
Creatinine: 0.8 mg/dL (ref 0.6–1.1)
EGFR: 72 mL/min/{1.73_m2} — AB (ref >90)
GLUCOSE: 94 mg/dL (ref 70–140)
POTASSIUM: 4.5 meq/L (ref 3.5–5.1)
Sodium: 137 mEq/L (ref 136–145)
TOTAL PROTEIN: 6.4 g/dL (ref 6.4–8.3)

## 2016-05-08 ENCOUNTER — Ambulatory Visit (HOSPITAL_BASED_OUTPATIENT_CLINIC_OR_DEPARTMENT_OTHER): Payer: Medicare Other | Admitting: Oncology

## 2016-05-08 ENCOUNTER — Telehealth: Payer: Self-pay | Admitting: Oncology

## 2016-05-08 VITALS — BP 167/89 | HR 64 | Temp 98.0°F | Resp 18 | Ht 61.0 in | Wt 90.9 lb

## 2016-05-08 DIAGNOSIS — M810 Age-related osteoporosis without current pathological fracture: Secondary | ICD-10-CM | POA: Diagnosis not present

## 2016-05-08 DIAGNOSIS — Z170 Estrogen receptor positive status [ER+]: Secondary | ICD-10-CM | POA: Diagnosis not present

## 2016-05-08 DIAGNOSIS — C50811 Malignant neoplasm of overlapping sites of right female breast: Secondary | ICD-10-CM | POA: Diagnosis not present

## 2016-05-08 DIAGNOSIS — C50511 Malignant neoplasm of lower-outer quadrant of right female breast: Secondary | ICD-10-CM

## 2016-05-08 MED ORDER — TAMOXIFEN CITRATE 20 MG PO TABS: 20.0000 mg | ORAL_TABLET | Freq: Every day | ORAL | Status: DC

## 2016-05-08 NOTE — Progress Notes (Signed)
Sarah Lewis  [phone] Fax:([phone]) [phone]     ID: SHEMIA BEVEL DOB: Dec 18, 1935  MR#: [id number]  SNK#:[id number]  Patient Care Team: Carol Ada, MD as PCP - General (Family Medicine) Stark Klein, MD as Consulting Physician (General Surgery) Chauncey Cruel, MD as Consulting Physician (Oncology) Kyung Rudd, MD as Consulting Physician (Radiation Oncology) Mauro Kaufmann, RN as Registered Nurse Rockwell Germany, RN as Registered Nurse Sylvan Cheese, NP as Nurse Practitioner (Hematology and Oncology) PCP: Reginia Naas, MD OTHER MD: Jacelyn Pi MD, Allyn Kenner MD  CHIEF COMPLAINT: Estrogen receptor positive breast cancer  CURRENT TREATMENT: tamoxifen  BREAST CANCER HISTORY:  From the original intake note:  Sarah Lewis tells me she found her left breast cancer in 1979 GERD "they were not doing mammograms then". She brought it to medical attention and Dr. Loralee Pacas did her left mastectomy and axillary lymph node dissection. She was then seen by Dr. Nicholes Stairs for medical oncology and received chemotherapy for 18 months (almost certainly this was CMF given every 3 weeks).  More recently she again herself felt a bump, in the underside of her right breast this time. Since she was scheduled for mammography later the same week, she simply mentioned it at the time of mammography, so that on 10/03/2015 she underwent bilateral diagnostic mammography with tomosynthesis at [facility]. The breast density was category D. In the right breast at the 6:00 position there was an irregular nodular density which by ultrasonography the same day was lobulated and measured 6 mm.  On 10/06/2015 she underwent ultrasound-guided biopsy of the right breast mass in question, and this found ([id number]) an invasive ductal carcinoma, grade 1 or 2, as well as in situ ductal carcinoma. The invasive tumor was estrogen receptor positive at 100%, progesterone receptor  positive at 95%, both with strong staining intensity, with an MIB-1 of 5%, and no HER-2 amplification, the signals ratio being 1.10 and the number per cell 2.15.  The patient's subsequent history is as detailed below  INTERVAL HISTORY: Sarah Lewis returns today for follow-up of her breast cancer. She continues on tamoxifen, which she obtains at a very good price Road she has no side effects from it that she is aware of and in particular no hot flashes or vaginal wetness problem  REVIEW OF SYSTEMS: Sarah Lewis has completed her reconstruction. She doesn't feel it came out quite as well as the earlier one but she accepted and "M done with all lap". [facility], doing yard work and housework, and basically "back to normal". A detailed review of systems today was otherwise noncontributory  PAST MEDICAL HISTORY: Past Medical History  Diagnosis Date  . Osteoporosis   . Hypertension   . Cough     from toporol  . Breast cancer of lower-outer quadrant of right female breast ([place]) 10/10/2015  . Osteoporosis   . PONV (postoperative nausea and vomiting)   . Spinal headache   . Heart murmur     PAST SURGICAL HISTORY: Past Surgical History  Procedure Laterality Date  . Cesarean section    . Abdominal hysterectomy    . Hernia repair    . Mastectomy Left 1979  . Breast surgery    . Tonsillectomy    . Appendectomy    . Total mastectomy Right 12/13/2015    Procedure: RIGHT MASTECTOMY ;  Surgeon: Stark Klein, MD;  Location: [place];  Service: General;  Laterality: Right;  . Breast reconstruction with placement  of tissue expander and flex hd (acellular hydrated dermis) Right 12/13/2015    Procedure: RIGHT BREAST RECONSTRUCTION WITH PLACEMENT OF silicone gel implant and allograft tissue;  Surgeon: Crissie Reese, MD;  Location: [place];  Service: Plastics;  Laterality: Right;    FAMILY HISTORY Family History  Problem Relation Age of Onset  . Bone cancer Father    the patient's father had  metastatic prostate cancer but died at 18 from heart disease. The patient's mother died from a clot to her lung at the age of 12, 3 months after the patient's father died. Sarah Lewis had no brothers, one sister. There is no other history of breast or ovarian cancer in the family.   GYNECOLOGIC HISTORY:  No LMP recorded. Patient is postmenopausal.  menarche age 89, first live birth age 59. The patient is GX P2. She underwent hysterectomy with bilateral salpingo-oophorectomy in 1982. She did not take hormone replacement. She never took oral contraceptives.   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 Her husband Sarah Lewis is a retired Medical illustrator. Daughter Sarah Lewis MAC avoid lives in [place] where she works as a Print production planner. Son Sarah Lewis lives in [place] where he works for an IT consultant. The patient is a Tourist information centre manager.    ADVANCED DIRECTIVES: In place    HEALTH MAINTENANCE: Social History  Substance Use Topics  . Smoking status: Never Smoker   . Smokeless tobacco: Never Used  . Alcohol Use: No     Colonoscopy: 2006   PAP:  Bone density: osteoporosis   Lipid panel:  No Known Allergies  Current Outpatient Prescriptions  Medication Sig Dispense Refill  . calcium-vitamin D (OSCAL WITH D) 500-200 MG-UNIT per tablet Take 2 tablets by mouth daily.    . metoprolol succinate (TOPROL-XL) 50 MG 24 hr tablet Take 50 mg by mouth daily.   0  . tamoxifen (NOLVADEX) 20 MG tablet Take 1 tablet (20 mg total) by mouth daily. 90 tablet 12   No current facility-administered medications for this visit.    OBJECTIVE: older white woman Who appears well Dr. Denny Peon injection surgeon  Filed Vitals:   05/08/16 1510  BP: 167/89  Pulse: 64  Temp: 98 F (36.7 C)  Resp: 18     Body mass index is 17.18 kg/(m^2).    ECOG FS:0 - Asymptomatic  Sclerae unicteric, EOMs intact Oropharynx clear, dentition in good repair No cervical or supraclavicular  adenopathy Lungs no rales or rhonchi Heart regular rate and rhythm Abd soft, nontender, positive bowel sounds MSK no focal spinal tenderness, no upper extremity lymphedema Neuro: nonfocal, well oriented, appropriate affect Breasts: Status post bilateral mastectomies with bilateral implants in place. There is no evidence of local recurrence. Both axillae are benign.    LAB RESULTS:  CMP     Component Value Date/Time   NA 137 04/30/2016 1008   NA 139 12/07/2015 1052   K 4.5 04/30/2016 1008   K 3.9 12/07/2015 1052   CL 101 12/07/2015 1052   CO2 30* 04/30/2016 1008   CO2 30 12/07/2015 1052   GLUCOSE 94 04/30/2016 1008   GLUCOSE 103* 12/07/2015 1052   BUN 11.1 04/30/2016 1008   BUN 12 12/07/2015 1052   CREATININE 0.8 04/30/2016 1008   CREATININE 0.75 12/07/2015 1052   CALCIUM 9.4 04/30/2016 1008   CALCIUM 9.0 12/07/2015 1052   PROT 6.4 04/30/2016 1008   ALBUMIN 3.7 04/30/2016 1008   AST 19 04/30/2016 1008   ALT 14 04/30/2016  1008   ALKPHOS 42 04/30/2016 1008   BILITOT 0.53 04/30/2016 1008   GFRNONAA >60 12/07/2015 1052   GFRAA >60 12/07/2015 1052    INo results found for: SPEP, UPEP  Lab Results  Component Value Date   WBC 5.1 04/30/2016   NEUTROABS 3.3 04/30/2016   HGB 12.9 04/30/2016   HCT 38.4 04/30/2016   MCV 91.2 04/30/2016   PLT 217 04/30/2016      Chemistry      Component Value Date/Time   NA 137 04/30/2016 1008   NA 139 12/07/2015 1052   K 4.5 04/30/2016 1008   K 3.9 12/07/2015 1052   CL 101 12/07/2015 1052   CO2 30* 04/30/2016 1008   CO2 30 12/07/2015 1052   BUN 11.1 04/30/2016 1008   BUN 12 12/07/2015 1052   CREATININE 0.8 04/30/2016 1008   CREATININE 0.75 12/07/2015 1052      Component Value Date/Time   CALCIUM 9.4 04/30/2016 1008   CALCIUM 9.0 12/07/2015 1052   ALKPHOS 42 04/30/2016 1008   AST 19 04/30/2016 1008   ALT 14 04/30/2016 1008   BILITOT 0.53 04/30/2016 1008       No results found for: LABCA2  No components found for:  LABCA125  No results for input(s): INR in the last 168 hours.  Urinalysis No results found for: COLORURINE, APPEARANCEUR, LABSPEC, PHURINE, GLUCOSEU, HGBUR, BILIRUBINUR, KETONESUR, PROTEINUR, UROBILINOGEN, NITRITE, LEUKOCYTESUR  STUDIES: No results found.  ASSESSMENT: 80 y.o. 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 Margins were ample  (4) adjuvant radiation not indicated  (5) tamoxifen started in early January 2017  (6) osteoporosis, on zolendronate   PLAN: Sunshyne is tolerating tamoxifen with no side effects and obtaining it at a good price. The plan will be to continue that for total of 5 years.  She is doing so well overall and is so reliable in terms of symptom management that I am comfortable seeing her on a once a year basis she greatly prefers that 2 more frequent visits.  She knows to call for any problems that may develop before her return.  Chauncey Cruel, MD   05/08/2016 3:24 PM Medical Oncology and Hematology [facility] [address] [place], [place] [address] Tel. [phone]    Fax. [phone]

## 2016-05-08 NOTE — Telephone Encounter (Signed)
appt made and avs printed °

## 2017-01-30 ENCOUNTER — Other Ambulatory Visit: Payer: Self-pay | Admitting: Gastroenterology

## 2017-04-05 ENCOUNTER — Encounter (HOSPITAL_COMMUNITY): Payer: Self-pay | Admitting: *Deleted

## 2017-04-05 NOTE — Progress Notes (Addendum)
Showed dr Marcie Bal ekg from 12-07-15 and 08-24-13 ekg from dr Alben Spittle and made dr hodierne aware pt medical history, ok to use ekg from 12-07-15 for 04-09-15 procedure per dr Josph Macho from 09-28-13 dr Irish Lack on chart

## 2017-04-08 ENCOUNTER — Encounter (HOSPITAL_COMMUNITY): Payer: Self-pay | Admitting: *Deleted

## 2017-04-08 ENCOUNTER — Encounter (HOSPITAL_COMMUNITY)
Admission: RE | Disposition: A | Discharge status: Home / Self Care | Payer: Self-pay | Source: Ambulatory Visit | Attending: Gastroenterology

## 2017-04-08 ENCOUNTER — Ambulatory Visit (HOSPITAL_COMMUNITY): Payer: Medicare Other | Admitting: Anesthesiology

## 2017-04-08 ENCOUNTER — Ambulatory Visit (HOSPITAL_COMMUNITY)
Admission: RE | Admit: 2017-04-08 | Discharge: 2017-04-08 | Disposition: A | Discharge status: Home / Self Care | Payer: Medicare Other | Source: Ambulatory Visit | Attending: Gastroenterology | Admitting: Gastroenterology

## 2017-04-08 DIAGNOSIS — I10 Essential (primary) hypertension: Secondary | ICD-10-CM | POA: Diagnosis not present

## 2017-04-08 DIAGNOSIS — J449 Chronic obstructive pulmonary disease, unspecified: Secondary | ICD-10-CM | POA: Insufficient documentation

## 2017-04-08 DIAGNOSIS — D120 Benign neoplasm of cecum: Secondary | ICD-10-CM | POA: Insufficient documentation

## 2017-04-08 DIAGNOSIS — Z1211 Encounter for screening for malignant neoplasm of colon: Secondary | ICD-10-CM | POA: Insufficient documentation

## 2017-04-08 DIAGNOSIS — K5730 Diverticulosis of large intestine without perforation or abscess without bleeding: Secondary | ICD-10-CM | POA: Insufficient documentation

## 2017-04-08 HISTORY — PX: COLONOSCOPY WITH PROPOFOL: SHX5780

## 2017-04-08 HISTORY — DX: Anemia, unspecified: D64.9

## 2017-04-08 SURGERY — COLONOSCOPY WITH PROPOFOL
Anesthesia: Monitor Anesthesia Care

## 2017-04-08 MED ORDER — SODIUM CHLORIDE 0.9 % IV SOLN: INTRAVENOUS | Status: DC

## 2017-04-08 MED ORDER — PROPOFOL 10 MG/ML IV BOLUS
INTRAVENOUS | Status: DC | PRN
  Administered 2017-04-08 (×2): 20 mg via INTRAVENOUS
  Administered 2017-04-08: 30 mg via INTRAVENOUS
  Administered 2017-04-08 (×6): 20 mg via INTRAVENOUS

## 2017-04-08 MED ORDER — PROPOFOL 10 MG/ML IV BOLUS
INTRAVENOUS | Status: AC
  Filled 2017-04-08: qty 40

## 2017-04-08 MED ORDER — PROPOFOL 500 MG/50ML IV EMUL
INTRAVENOUS | Status: DC | PRN
  Administered 2017-04-08: 50 ug/kg/min via INTRAVENOUS

## 2017-04-08 MED ORDER — LACTATED RINGERS IV SOLN
INTRAVENOUS | Status: DC
  Administered 2017-04-08: 1000 mL via INTRAVENOUS

## 2017-04-08 SURGICAL SUPPLY — 21 items

## 2017-04-08 NOTE — Op Note (Signed)
[facility] Patient Name: Sarah Lewis Procedure Date: 04/08/2017 MRN: [id number] Attending MD: Garlan Fair , MD Date of Birth: [birth year] CSN: [id number] Age: 81 Admit Type: Outpatient Procedure:                Colonoscopy Indications:              Screening for colorectal malignant neoplasm Providers:                Garlan Fair, MD, Cleda Daub, RN, Alfonso Patten, Technician, Anne Fu CRNA, CRNA Referring MD:              Medicines:                Propofol per Anesthesia Complications:            No immediate complications. Estimated Blood Loss:     Estimated blood loss was minimal. Procedure:                Pre-Anesthesia Assessment:                           - Prior to the procedure, a History and Physical                            was performed, and patient medications and                            allergies were reviewed. The patient's tolerance of                            previous anesthesia was also reviewed. The risks                            and benefits of the procedure and the sedation                            options and risks were discussed with the patient.                            All questions were answered, and informed consent                            was obtained. Prior Anticoagulants: The patient has                            taken aspirin, last dose was day of procedure. ASA                            Grade Assessment: II - A patient with mild systemic                            disease. After reviewing the risks and benefits,  the patient was deemed in satisfactory condition to                            undergo the procedure.                           After obtaining informed consent, the colonoscope                            was passed under direct vision. Throughout the                            procedure, the patient's blood pressure, pulse, and                       oxygen saturations were monitored continuously. The                            EC-[id number] ([id number]) scope was introduced through                            the anus and advanced to the the cecum, identified                            by appendiceal orifice and ileocecal valve. The                            colonoscopy was technically difficult and complex                            due to significant looping. The patient tolerated                            the procedure well. The quality of the bowel                            preparation was good. The appendiceal orifice and                            the rectum were photographed. Scope In: 7:50:22 AM Scope Out: 2:84:13 AM Scope Withdrawal Time: 0 hours 10 minutes 3 seconds  Total Procedure Duration: 0 hours 25 minutes 57 seconds  Findings:      The perianal and digital rectal examinations were normal.      A 6 mm sessile serrated appearing polyp was found in the cecum. The       polyp was removed with a cold snare. Resection and retrieval were       complete.      The exam was otherwise without abnormality. Universal colonic       diverticulosis was present. Impression:               - One 6 mm polyp in the cecum, removed with a cold                            snare. Resected and retrieved.                           -  The examination was otherwise normal. Moderate Sedation:      N/A- Per Anesthesia Care Recommendation:           - Patient has a contact number available for                            emergencies. The signs and symptoms of potential                            delayed complications were discussed with the                            patient. Return to normal activities tomorrow.                            Written discharge instructions were provided to the                            patient.                           - Repeat colonoscopy is not recommended for                             surveillance.                           - Resume previous diet.                           - Continue present medications. Procedure Code(s):        --- Professional ---                           [phone], Colonoscopy, flexible; with removal of                            tumor(s), polyp(s), or other lesion(s) by snare                            technique Diagnosis Code(s):        --- Professional ---                           Z12.11, Encounter for screening for malignant                            neoplasm of colon                           D12.0, Benign neoplasm of cecum CPT copyright 2016 American Medical Association. All rights reserved. The codes documented in this report are preliminary and upon coder review may  be revised to meet current compliance requirements. Earle Gell, MD Garlan Fair, MD 04/08/2017 8:25:56 AM This report has been signed electronically. Number of Addenda: 0

## 2017-04-08 NOTE — Transfer of Care (Signed)
Immediate Anesthesia Transfer of Care Note  Patient: Sarah Lewis  Procedure(s) Performed: Procedure(s): COLONOSCOPY WITH PROPOFOL (N/A)  Patient Location: PACU  Anesthesia Type:MAC  Level of Consciousness:  sedated, patient cooperative and responds to stimulation  Airway & Oxygen Therapy:Patient Spontanous Breathing and Patient connected to face mask oxgen  Post-op Assessment:  Report given to PACU RN and Post -op Vital signs reviewed and stable  Post vital signs:  Reviewed and stable  Last Vitals:  Vitals:   04/08/17 0652  BP: (!) 180/93  Pulse: 70  Resp: 19  Temp: 26.7 C    Complications: No apparent anesthesia complications

## 2017-04-08 NOTE — Anesthesia Postprocedure Evaluation (Signed)
Anesthesia Post Note  Patient: Sarah Lewis  Procedure(s) Performed: Procedure(s) (LRB): COLONOSCOPY WITH PROPOFOL (N/A)  Patient location during evaluation: PACU Anesthesia Type: MAC Level of consciousness: awake and alert Pain management: pain level controlled Vital Signs Assessment: post-procedure vital signs reviewed and stable Respiratory status: spontaneous breathing, nonlabored ventilation, respiratory function stable and patient connected to nasal cannula oxygen Cardiovascular status: stable and blood pressure returned to baseline Anesthetic complications: no       Last Vitals:  Vitals:   04/08/17 0652 04/08/17 0822  BP: (!) 180/93 (!) 126/59  Pulse: 70 66  Resp: 19 (!) 24  Temp: 36.7 C 36.4 C    Last Pain:  Vitals:   04/08/17 0822  TempSrc: Oral                 Danelle Curiale S

## 2017-04-08 NOTE — H&P (Signed)
Procedure: Screening colonoscopy. Normal baseline screening colonoscopy was performed on 03/26/2006  History: The patient is a 81 year old female born Apr 28, 1936. She is scheduled to undergo a repeat screening colonoscopy today.  Past medical history: Total abdominal hysterectomy. Bilateral salpingo-oophorectomy. Cesarean sections. Ovarian cyst removal. Left mastectomy for breast cancer. Right inguinal herniorrhaphy. Left breast reconstruction surgery. Osteoporosis.  Medication allergies: Maxide caused anorexia. Lisinopril causes cough. Cozaar cause cough. Bystolic caused insomnia. Amlodipine caused cough.  Exam: The patient is alert and lying comfortably on the endoscopy stretcher. Abdomen is soft and nontender to palpation. Cardiac exam reveals a regular rhythm. Lungs are clear to auscultation.  Plan: Proceed with screening colonoscopy

## 2017-04-08 NOTE — Discharge Instructions (Signed)

## 2017-04-08 NOTE — Anesthesia Preprocedure Evaluation (Signed)
Anesthesia Evaluation  Patient identified by MRN, date of birth, ID band Patient awake    Reviewed: Allergy & Precautions, NPO status , Patient's Chart, lab work & pertinent test results  Airway Mallampati: II  TM Distance: >3 FB Neck ROM: Full    Dental no notable dental hx.    Pulmonary COPD,    Pulmonary exam normal breath sounds clear to auscultation       Cardiovascular hypertension, Normal cardiovascular exam Rhythm:Regular Rate:Normal     Neuro/Psych negative neurological ROS  negative psych ROS   GI/Hepatic negative GI ROS, Neg liver ROS,   Endo/Other  negative endocrine ROS  Renal/GU negative Renal ROS  negative genitourinary   Musculoskeletal negative musculoskeletal ROS (+)   Abdominal   Peds negative pediatric ROS (+)  Hematology negative hematology ROS (+) anemia ,   Anesthesia Other Findings   Reproductive/Obstetrics negative OB ROS                             Anesthesia Physical Anesthesia Plan  ASA: III  Anesthesia Plan: MAC   Post-op Pain Management:    Induction: Intravenous  Airway Management Planned: Nasal Cannula  Additional Equipment:   Intra-op Plan:   Post-operative Plan:   Informed Consent: I have reviewed the patients History and Physical, chart, labs and discussed the procedure including the risks, benefits and alternatives for the proposed anesthesia with the patient or authorized representative who has indicated his/her understanding and acceptance.   Dental advisory given  Plan Discussed with: CRNA and Surgeon  Anesthesia Plan Comments:         Anesthesia Quick Evaluation

## 2017-04-09 ENCOUNTER — Encounter (HOSPITAL_COMMUNITY): Payer: Self-pay | Admitting: Gastroenterology

## 2017-04-30 ENCOUNTER — Other Ambulatory Visit: Payer: Self-pay

## 2017-04-30 DIAGNOSIS — C50511 Malignant neoplasm of lower-outer quadrant of right female breast: Secondary | ICD-10-CM

## 2017-05-01 ENCOUNTER — Other Ambulatory Visit (HOSPITAL_BASED_OUTPATIENT_CLINIC_OR_DEPARTMENT_OTHER): Payer: Medicare Other

## 2017-05-01 ENCOUNTER — Other Ambulatory Visit: Payer: Medicare Other

## 2017-05-01 DIAGNOSIS — C50811 Malignant neoplasm of overlapping sites of right female breast: Secondary | ICD-10-CM

## 2017-05-01 DIAGNOSIS — C50511 Malignant neoplasm of lower-outer quadrant of right female breast: Secondary | ICD-10-CM

## 2017-05-01 LAB — CBC WITH DIFFERENTIAL/PLATELET
BASO%: 1.3 % (ref 0.0–2.0)
BASOS ABS: 0.1 10*3/uL (ref 0.0–0.1)
EOS%: 1.5 % (ref 0.0–7.0)
Eosinophils Absolute: 0.1 10*3/uL (ref 0.0–0.5)
HEMATOCRIT: 37 % (ref 34.8–46.6)
HGB: 12.5 g/dL (ref 11.6–15.9)
LYMPH#: 1.8 10*3/uL (ref 0.9–3.3)
LYMPH%: 26.8 % (ref 14.0–49.7)
MCH: 31.2 pg (ref 25.1–34.0)
MCHC: 33.9 g/dL (ref 31.5–36.0)
MCV: 91.9 fL (ref 79.5–101.0)
MONO#: 0.5 10*3/uL (ref 0.1–0.9)
MONO%: 7.5 % (ref 0.0–14.0)
NEUT%: 62.9 % (ref 38.4–76.8)
NEUTROS ABS: 4.3 10*3/uL (ref 1.5–6.5)
PLATELETS: 242 10*3/uL (ref 145–400)
RBC: 4.02 10*6/uL (ref 3.70–5.45)
RDW: 12.8 % (ref 11.2–14.5)
WBC: 6.8 10*3/uL (ref 3.9–10.3)

## 2017-05-01 LAB — COMPREHENSIVE METABOLIC PANEL
ALT: 13 U/L (ref 0–55)
ANION GAP: 7 meq/L (ref 3–11)
AST: 17 U/L (ref 5–34)
Albumin: 3.7 g/dL (ref 3.5–5.0)
Alkaline Phosphatase: 46 U/L (ref 40–150)
BILIRUBIN TOTAL: 0.36 mg/dL (ref 0.20–1.20)
BUN: 15.6 mg/dL (ref 7.0–26.0)
CALCIUM: 9.3 mg/dL (ref 8.4–10.4)
CHLORIDE: 102 meq/L (ref 98–109)
CO2: 30 meq/L — AB (ref 22–29)
CREATININE: 1 mg/dL (ref 0.6–1.1)
EGFR: 55 mL/min/{1.73_m2} — ABNORMAL LOW (ref >90)
Glucose: 101 mg/dl (ref 70–140)
Potassium: 4.3 mEq/L (ref 3.5–5.1)
Sodium: 139 mEq/L (ref 136–145)
TOTAL PROTEIN: 6.1 g/dL — AB (ref 6.4–8.3)

## 2017-05-08 ENCOUNTER — Ambulatory Visit (HOSPITAL_BASED_OUTPATIENT_CLINIC_OR_DEPARTMENT_OTHER): Payer: Medicare Other | Admitting: Oncology

## 2017-05-08 ENCOUNTER — Telehealth: Payer: Self-pay | Admitting: Oncology

## 2017-05-08 VITALS — BP 162/67 | HR 59 | Temp 97.7°F | Resp 17 | Ht 61.0 in | Wt 93.9 lb

## 2017-05-08 DIAGNOSIS — C50811 Malignant neoplasm of overlapping sites of right female breast: Secondary | ICD-10-CM

## 2017-05-08 DIAGNOSIS — Z170 Estrogen receptor positive status [ER+]: Secondary | ICD-10-CM | POA: Diagnosis not present

## 2017-05-08 DIAGNOSIS — Z79810 Long term (current) use of selective estrogen receptor modulators (SERMs): Secondary | ICD-10-CM | POA: Diagnosis not present

## 2017-05-08 DIAGNOSIS — M810 Age-related osteoporosis without current pathological fracture: Secondary | ICD-10-CM | POA: Diagnosis not present

## 2017-05-08 DIAGNOSIS — C50511 Malignant neoplasm of lower-outer quadrant of right female breast: Secondary | ICD-10-CM

## 2017-05-08 MED ORDER — TAMOXIFEN CITRATE 20 MG PO TABS: 20.0000 mg | ORAL_TABLET | Freq: Every day | ORAL | 12 refills | Status: DC

## 2017-05-08 NOTE — Progress Notes (Signed)
[place]  Telephone:([phone] Fax:([phone]     ID: [name] DOB: [birth year]  MR#: [id number]  IPJ#:[id number]  Patient Care Team: Carol Ada, MD as PCP - General (Family Medicine) Stark Klein, MD as Consulting Physician (General Surgery) Magrinat, Virgie Dad, MD as Consulting Physician (Oncology) Kyung Rudd, MD as Consulting Physician (Radiation Oncology) Mauro Kaufmann, RN as Registered Nurse Rockwell Germany, RN as Registered Nurse Jake Shark, Johny Blamer, NP as Nurse Practitioner (Hematology and Oncology) PCP: Carol Ada, MD OTHER MD: Jacelyn Pi MD, Allyn Kenner MD  CHIEF COMPLAINT: Estrogen receptor positive breast cancer  CURRENT TREATMENT: tamoxifen  BREAST CANCER HISTORY:  From the original intake note:  Sarah Lewis tells me she found her left breast cancer in 1979 GERD "they were not doing mammograms then". She brought it to medical attention and Dr. Loralee Pacas did her left mastectomy and axillary lymph node dissection. She was then seen by Dr. Nicholes Stairs for medical oncology and received chemotherapy for 18 months (almost certainly this was CMF given every 3 weeks).  More recently she again herself felt a bump, in the underside of her right breast this time. Since she was scheduled for mammography later the same week, she simply mentioned it at the time of mammography, so that on 10/03/2015 she underwent bilateral diagnostic mammography with tomosynthesis at [facility]. The breast density was category D. In the right breast at the 6:00 position there was an irregular nodular density which by ultrasonography the same day was lobulated and measured 6 mm.  On 10/06/2015 she underwent ultrasound-guided biopsy of the right breast mass in question, and this found ([id number]) an invasive ductal carcinoma, grade 1 or 2, as well as in situ ductal carcinoma. The invasive tumor was estrogen receptor positive at 100%, progesterone receptor  positive at 95%, both with strong staining intensity, with an MIB-1 of 5%, and no HER-2 amplification, the signals ratio being 1.10 and the number per cell 2.15.  The patient's subsequent history is as detailed below  INTERVAL HISTORY: Sarah Lewis returns today for follow-up of her estrogen receptor positive breast cancer. The interval history is generally benign. She continues on tamoxifen, with good tolerance. She "does not know that she is taking it". Specifically she denies hot flashes and vaginal wetness. She obtains a drug at a very good price.  REVIEW OF SYSTEMS: Sarah Lewis has "no complaints". 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 She lives that and she is also very active as a Psychologist, occupational in her church, which is a [employer]. She goes to the wide 2 or 3 times a week and tries to walk the other days. A detailed review of systems today was otherwise stable.  PAST MEDICAL HISTORY: Past Medical History:  Diagnosis Date  . Anemia yrs ago  . Breast cancer of lower-outer quadrant of right female breast ([place]) 10/10/2015   left breat also chemo done for first  . Cough    from toporol  . Heart murmur   . Hypertension   . Osteoporosis   . Spinal headache yrs ago    PAST SURGICAL HISTORY: Past Surgical History:  Procedure Laterality Date  . ABDOMINAL HYSTERECTOMY     total  . APPENDECTOMY    . BREAST RECONSTRUCTION WITH PLACEMENT OF TISSUE EXPANDER AND FLEX HD (ACELLULAR HYDRATED DERMIS) Right 12/13/2015   Procedure: RIGHT BREAST RECONSTRUCTION WITH PLACEMENT  OF silicone gel implant and allograft tissue;  Surgeon: Crissie Reese, MD;  Location: [place];  Service: Plastics;  Laterality: Right;  . BREAST SURGERY    . CESAREAN SECTION     x 2  . COLONOSCOPY WITH PROPOFOL N/A 04/08/2017   Procedure: COLONOSCOPY WITH PROPOFOL;  Surgeon: Garlan Fair, MD;  [facility];  Service: Endoscopy;  Laterality: N/A;  . HERNIA REPAIR    . MASTECTOMY Left 1979  . TONSILLECTOMY    . TOTAL MASTECTOMY Right 12/13/2015   Procedure: RIGHT MASTECTOMY ;  Surgeon: Stark Klein, MD;  Location: [place];  Service: General;  Laterality: Right;    FAMILY HISTORY Family History  Problem Relation Age of Onset  . Bone cancer Father    the patient's father had metastatic prostate cancer but died at 75 from heart disease. The patient's mother died from a clot to her lung at the age of 44, 3 months after the patient's father died. Sarah Lewis had no brothers, one sister. There is no other history of breast or ovarian cancer in the family.   GYNECOLOGIC HISTORY:  No LMP recorded. Patient has had a hysterectomy.  menarche age 66, first live birth age 43. The patient is GX P2. She underwent hysterectomy with bilateral salpingo-oophorectomy in 1982. She did not take hormone replacement. She never took oral contraceptives.   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 Her husband Sarah Lewis is a retired Medical illustrator. Daughter Sarah Lewis MAC avoid lives in [place] where she works as a Print production planner. Son Sarah Lewis lives in [place] where he works for an IT consultant. The patient is a Tourist information centre manager.    ADVANCED DIRECTIVES: In place    HEALTH MAINTENANCE: Social History  Substance Use Topics  . Smoking status: Never Smoker  . Smokeless tobacco: Never Used  . Alcohol use No     Colonoscopy: 2006   PAP:  Bone density: osteoporosis   Lipid panel:  No Known Allergies  Current Outpatient Prescriptions  Medication Sig Dispense Refill  . Calcium Carb-Cholecalciferol (CALCIUM-VITAMIN D) 500-400 MG-UNIT TABS Take 1 tablet by mouth 2 (two) times daily.    . cholecalciferol (VITAMIN D) 400 units TABS tablet Take 400 Units by mouth daily.    . metoprolol succinate (TOPROL-XL) 50 MG 24 hr tablet Take 50 mg by mouth daily.   0  . tamoxifen  (NOLVADEX) 20 MG tablet Take 1 tablet (20 mg total) by mouth daily. 90 tablet 12   No current facility-administered medications for this visit.     OBJECTIVE: older white woman In no acute distress Vitals:   05/08/17 1451  BP: (!) 162/67  Pulse: (!) 59  Resp: 17  Temp: 97.7 F (36.5 C)     Body mass index is 17.74 kg/m.    ECOG FS:0 - Asymptomatic  Sclerae unicteric, pupils round and equal Oropharynx clear and moist No cervical or supraclavicular adenopathy Lungs no rales or rhonchi Heart regular rate and rhythm Abd soft, nontender, positive bowel sounds MSK no focal spinal tenderness, no upper extremity lymphedema Neuro: nonfocal, well oriented, appropriate affect Breasts: She has undergone bilateral mastectomies. There are bilateral implants in place. There is no evidence of local recurrence. Both axillae are benign.    LAB RESULTS:  CMP     Component Value Date/Time   NA 139 05/01/2017 1341   K 4.3 05/01/2017 1341   CL 101 12/07/2015 1052   CO2 30 (H) 05/01/2017 1341  GLUCOSE 101 05/01/2017 1341   BUN 15.6 05/01/2017 1341   CREATININE 1.0 05/01/2017 1341   CALCIUM 9.3 05/01/2017 1341   PROT 6.1 (L) 05/01/2017 1341   ALBUMIN 3.7 05/01/2017 1341   AST 17 05/01/2017 1341   ALT 13 05/01/2017 1341   ALKPHOS 46 05/01/2017 1341   BILITOT 0.36 05/01/2017 1341   GFRNONAA >60 12/07/2015 1052   GFRAA >60 12/07/2015 1052    INo results found for: SPEP, UPEP  Lab Results  Component Value Date   WBC 6.8 05/01/2017   NEUTROABS 4.3 05/01/2017   HGB 12.5 05/01/2017   HCT 37.0 05/01/2017   MCV 91.9 05/01/2017   PLT 242 05/01/2017      Chemistry      Component Value Date/Time   NA 139 05/01/2017 1341   K 4.3 05/01/2017 1341   CL 101 12/07/2015 1052   CO2 30 (H) 05/01/2017 1341   BUN 15.6 05/01/2017 1341   CREATININE 1.0 05/01/2017 1341      Component Value Date/Time   CALCIUM 9.3 05/01/2017 1341   ALKPHOS 46 05/01/2017 1341   AST 17 05/01/2017 1341   ALT  13 05/01/2017 1341   BILITOT 0.36 05/01/2017 1341       No results found for: LABCA2  No components found for: LABCA125  No results for input(s): INR in the last 168 hours.  Urinalysis No results found for: COLORURINE, APPEARANCEUR, LABSPEC, PHURINE, GLUCOSEU, HGBUR, BILIRUBINUR, KETONESUR, PROTEINUR, UROBILINOGEN, NITRITE, LEUKOCYTESUR  STUDIES: No results found.  ASSESSMENT: 81 y.o. [place] woman  (1) status post left mastectomy and neck were lymph node dissection with silicone implant reconstruction in 1979 for lymph node positive breast cancer treated adjuvantly with chemotherapy (likely CMF) for 18 months  (2) status post right breast biopsy 10/06/2015 for a clinical T1b N0, stage IA invasive ductal carcinoma, grade 1 or 2, with mucinous features; estrogen and progesterone receptor positive  (3) status post right mastectomy 12/13/2015 for a Right invasive ductal carcinoma, grade 1, repeat HER-2 again negative. Margins were ample  (4) adjuvant radiation not indicated  (5) tamoxifen started in early January 2017  (6) osteoporosis, on zolendronate   PLAN: Kathyleen is just about 2 years out from definitive surgery for her breast cancer with no evidence of disease recurrence. This is very favorable.  She is tolerating tamoxifen well. The plan is to continue that for a total of 5 years at which time she will "graduate" from follow-up here.  She has a good exercise program, a good diet and an excellent attitude.  She will see me again in one year. She knows to call for any problems that may develop before that visit.  Chauncey Cruel, MD   05/08/2017 3:19 PM Medical Oncology and Hematology [facility] [address] [place], [place] [address] Tel. [phone]    Fax. [phone]

## 2017-05-08 NOTE — Telephone Encounter (Signed)
Gave patient AVS and calender per 5/23 los - lab and f/u in one year

## 2017-05-20 NOTE — Addendum Note (Signed)
Addendum  created 05/20/17 1338 by Myrtie Soman, MD   Sign clinical note

## 2017-05-20 NOTE — Anesthesia Postprocedure Evaluation (Signed)
Anesthesia Post Note  Patient: Sarah Lewis  Procedure(s) Performed: Procedure(s) (LRB): COLONOSCOPY WITH PROPOFOL (N/A)     Anesthesia Post Evaluation  Last Vitals:  Vitals:   04/08/17 0845 04/08/17 0850  BP:  (!) 157/56  Pulse: 64 61  Resp: 17 19  Temp:      Last Pain:  Vitals:   04/10/17 1315  TempSrc:   PainSc: 0-No pain                 Rydan Gulyas S

## 2017-07-26 IMAGING — CR DG CHEST 2V
2 series · 2 of 2 positions shown · non-contrast
Comparison: None.

CLINICAL DATA: Chronic cough and wheezing on physical exam

EXAM:
CHEST - 2 VIEW

[w chest pa]
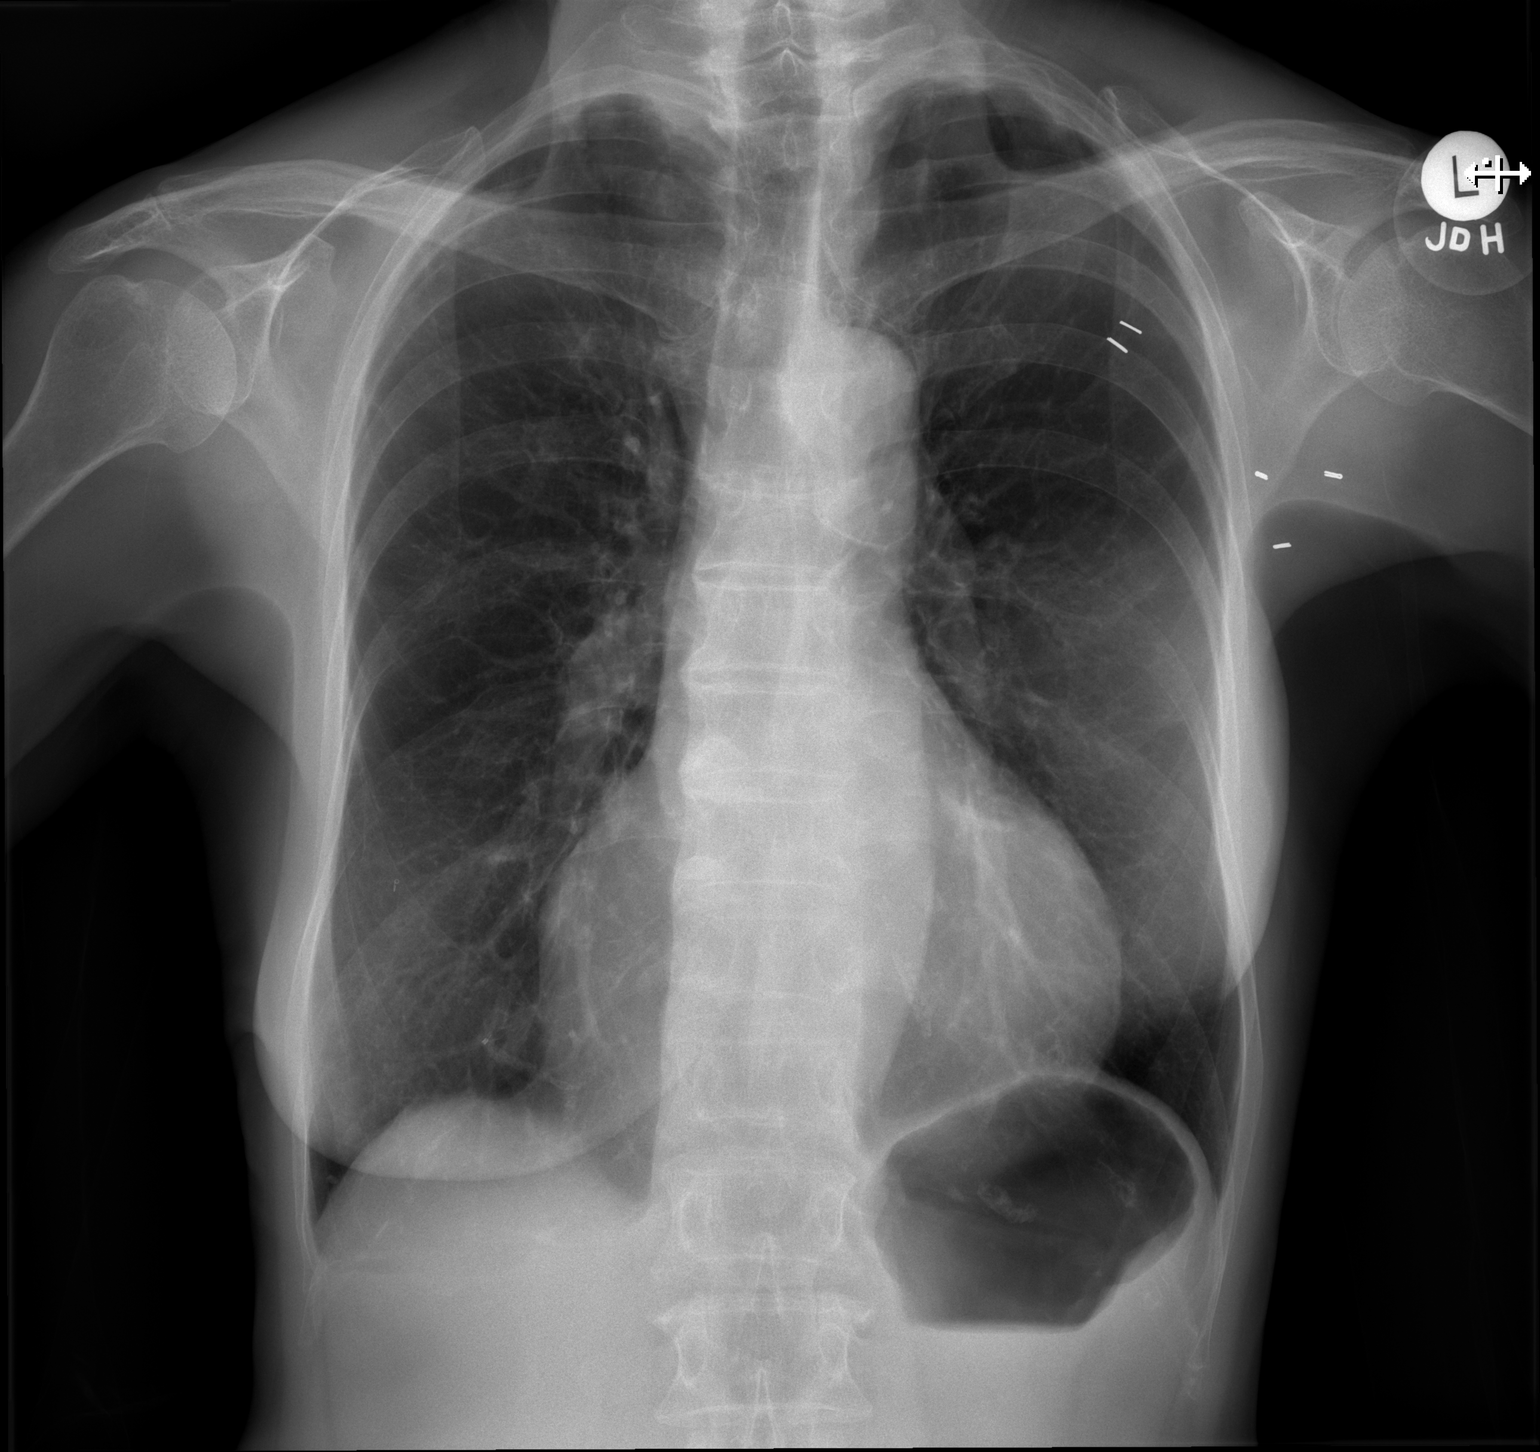

[w chest lat]
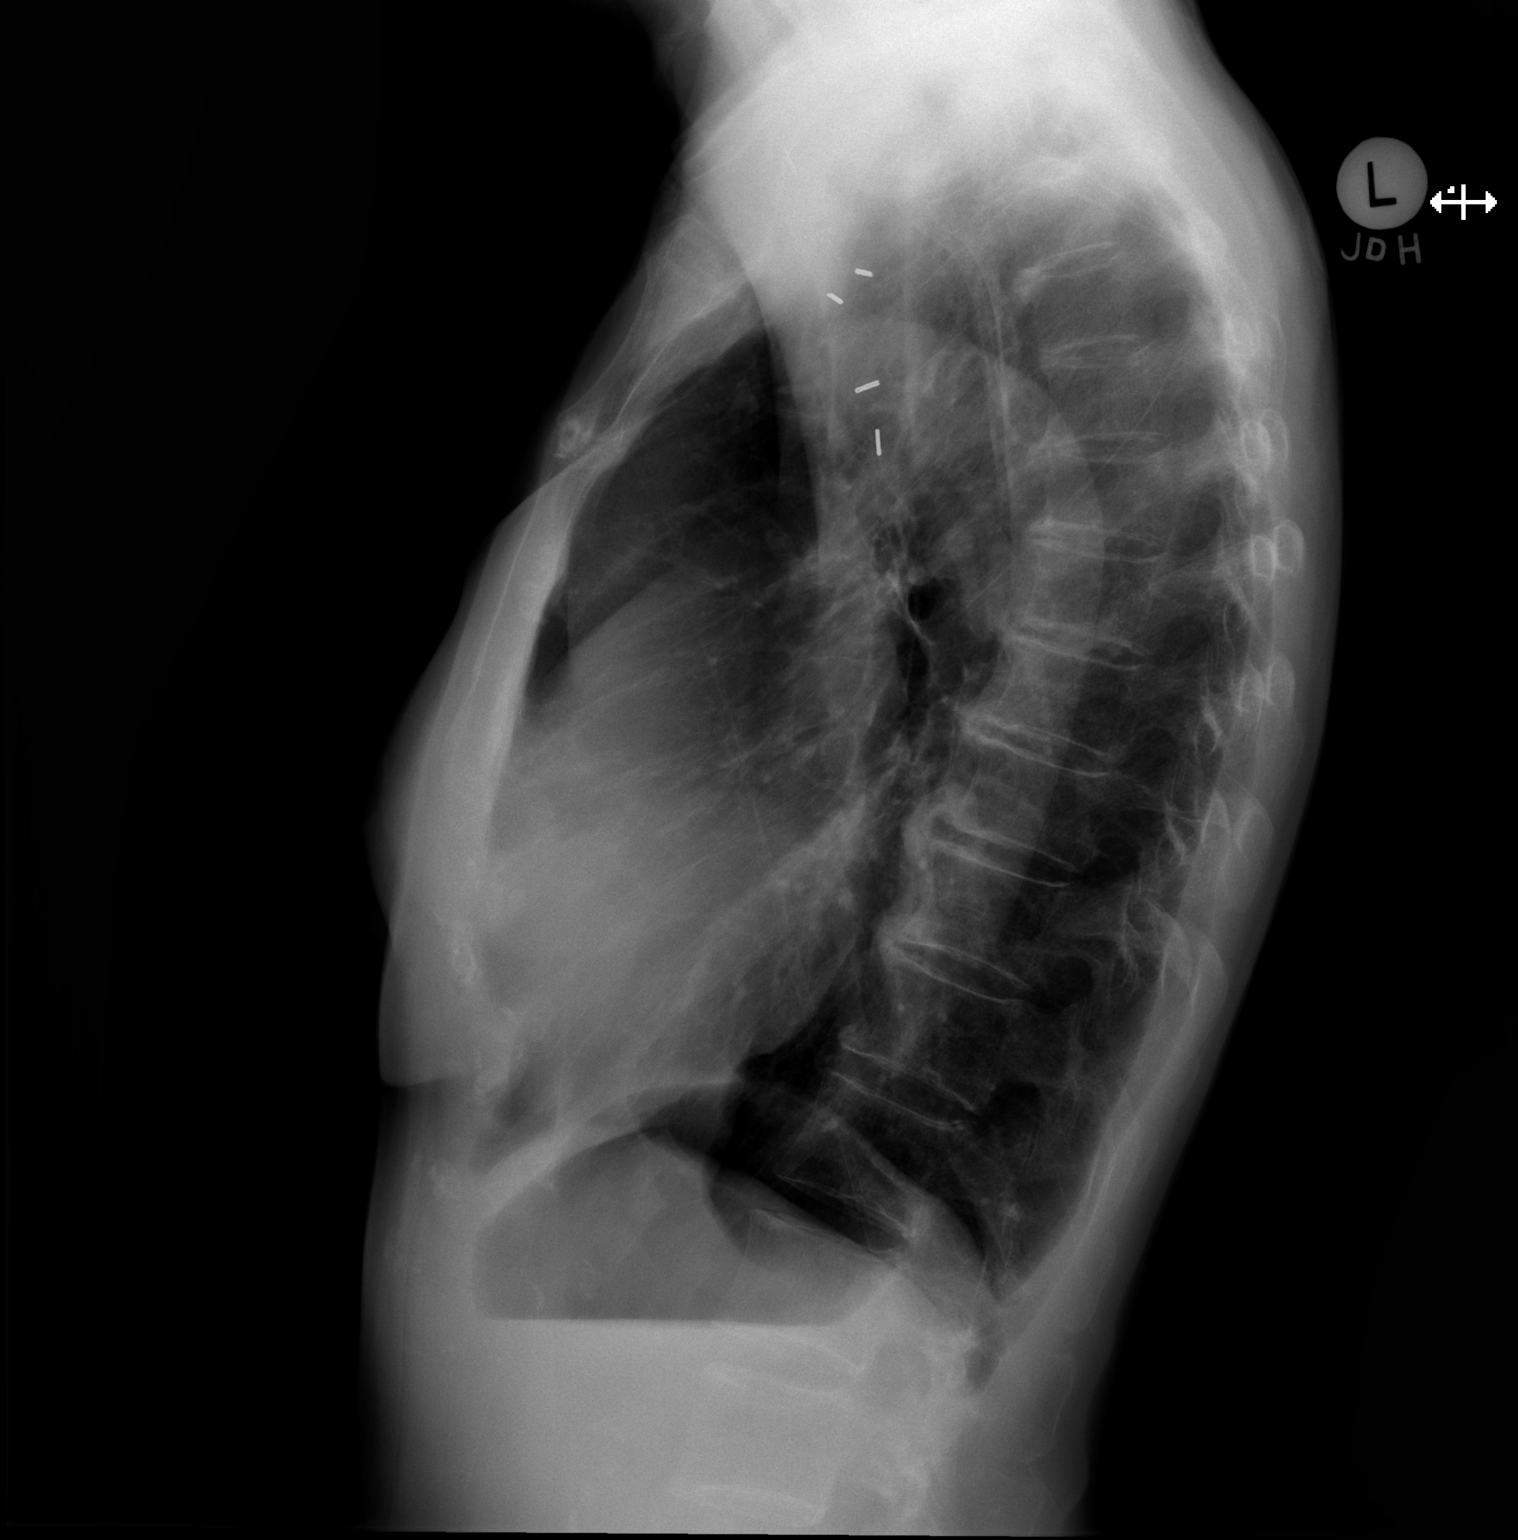

[2 of 2 positions shown; findings below may reference images not displayed]

FINDINGS: Cardiac shadow is mildly enlarged. The lungs are well aerated
bilaterally. No focal infiltrate or sizable effusion is noted.
Increased density is noted over the left mid chest secondary to
prior reconstructive surgery.
IMPRESSION: No active disease.

## 2018-05-07 ENCOUNTER — Other Ambulatory Visit: Payer: Self-pay

## 2018-05-07 DIAGNOSIS — C50911 Malignant neoplasm of unspecified site of right female breast: Secondary | ICD-10-CM

## 2018-05-07 DIAGNOSIS — Z170 Estrogen receptor positive status [ER+]: Principal | ICD-10-CM

## 2018-05-07 DIAGNOSIS — C50511 Malignant neoplasm of lower-outer quadrant of right female breast: Secondary | ICD-10-CM
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
## 2018-05-08 ENCOUNTER — Telehealth: Payer: Self-pay | Admitting: Oncology

## 2018-05-08 ENCOUNTER — Inpatient Hospital Stay: Payer: Medicare Other

## 2018-05-08 ENCOUNTER — Inpatient Hospital Stay: Payer: Medicare Other | Attending: Oncology | Admitting: Oncology

## 2018-05-08 VITALS — BP 171/72 | HR 62 | Temp 98.6°F | Resp 18 | Ht 61.0 in | Wt 91.6 lb

## 2018-05-08 DIAGNOSIS — Z170 Estrogen receptor positive status [ER+]: Secondary | ICD-10-CM | POA: Insufficient documentation

## 2018-05-08 DIAGNOSIS — C50811 Malignant neoplasm of overlapping sites of right female breast: Secondary | ICD-10-CM | POA: Insufficient documentation

## 2018-05-08 DIAGNOSIS — C50511 Malignant neoplasm of lower-outer quadrant of right female breast: Secondary | ICD-10-CM

## 2018-05-08 DIAGNOSIS — C50911 Malignant neoplasm of unspecified site of right female breast: Secondary | ICD-10-CM

## 2018-05-08 DIAGNOSIS — Z79810 Long term (current) use of selective estrogen receptor modulators (SERMs): Secondary | ICD-10-CM | POA: Diagnosis not present

## 2018-05-08 DIAGNOSIS — M810 Age-related osteoporosis without current pathological fracture: Secondary | ICD-10-CM

## 2018-05-08 LAB — CMP (CANCER CENTER ONLY)
ALK PHOS: 53 U/L (ref 40–150)
ALT: 20 U/L (ref 0–55)
ANION GAP: 4 (ref 3–11)
AST: 22 U/L (ref 5–34)
Albumin: 3.5 g/dL (ref 3.5–5.0)
BILIRUBIN TOTAL: 0.3 mg/dL (ref 0.2–1.2)
BUN: 15 mg/dL (ref 7–26)
CALCIUM: 8.9 mg/dL (ref 8.4–10.4)
CO2: 31 mmol/L — AB (ref 22–29)
Chloride: 104 mmol/L (ref 98–109)
Creatinine: 0.8 mg/dL (ref 0.60–1.10)
Glucose, Bld: 104 mg/dL (ref 70–140)
Potassium: 4 mmol/L (ref 3.5–5.1)
SODIUM: 139 mmol/L (ref 136–145)
TOTAL PROTEIN: 5.9 g/dL — AB (ref 6.4–8.3)

## 2018-05-08 LAB — CBC WITH DIFFERENTIAL (CANCER CENTER ONLY)
BASOS ABS: 0.1 10*3/uL (ref 0.0–0.1)
BASOS PCT: 1 %
EOS ABS: 0.1 10*3/uL (ref 0.0–0.5)
Eosinophils Relative: 2 %
HEMATOCRIT: 36.8 % (ref 34.8–46.6)
HEMOGLOBIN: 12.4 g/dL (ref 11.6–15.9)
Lymphocytes Relative: 23 %
Lymphs Abs: 1.5 10*3/uL (ref 0.9–3.3)
MCH: 31.2 pg (ref 25.1–34.0)
MCHC: 33.6 g/dL (ref 31.5–36.0)
MCV: 93 fL (ref 79.5–101.0)
MONOS PCT: 7 %
Monocytes Absolute: 0.5 10*3/uL (ref 0.1–0.9)
NEUTROS ABS: 4.4 10*3/uL (ref 1.5–6.5)
NEUTROS PCT: 67 %
Platelet Count: 215 10*3/uL (ref 145–400)
RBC: 3.96 MIL/uL (ref 3.70–5.45)
RDW: 13 % (ref 11.2–14.5)
WBC Count: 6.6 10*3/uL (ref 3.9–10.3)

## 2018-05-08 NOTE — Telephone Encounter (Signed)
Gave patient AVs and calendar of upcoming June 2020 appointments.

## 2018-07-11 ENCOUNTER — Other Ambulatory Visit: Payer: Self-pay | Admitting: Oncology

## 2018-08-06 ENCOUNTER — Encounter: Payer: Self-pay | Admitting: *Deleted

## 2018-08-13 ENCOUNTER — Telehealth: Payer: Self-pay | Admitting: *Deleted

## 2018-08-13 NOTE — Telephone Encounter (Signed)
NOTES FAXED TO [facility] FROM [facility] DR. CANDACE SMITH.

## 2018-08-26 NOTE — Progress Notes (Signed)
Referring-Sarah Smith MD Reason for referral-Hypertension  HPI: 82 yo female for evaluation of hypertension at request of Carol Ada MD. echocardiogram 2014 showed normal LV function, mild to moderate aortic insufficiency, mild mitral regurgitation and moderate tricuspid regurgitation.  Patient keeps blood pressure records and her systolic typically is in the 130-150 range.  She has dyspnea with more vigorous activities but not routine activities.  No orthopnea, PND, pedal edema, chest pain or syncope.  Current Outpatient Medications  Medication Sig Dispense Refill  . aspirin 81 MG tablet Take 81 mg by mouth daily.    . Calcium Carb-Cholecalciferol (CALCIUM-VITAMIN D) 500-400 MG-UNIT TABS Take 1 tablet by mouth 2 (two) times daily.    . cholecalciferol (VITAMIN D) 400 units TABS tablet Take 400 Units by mouth daily.    . metoprolol succinate (TOPROL-XL) 25 MG 24 hr tablet Take 25 mg by mouth daily.    . metoprolol succinate (TOPROL-XL) 50 MG 24 hr tablet Take 50 mg by mouth daily.   0  . tamoxifen (NOLVADEX) 20 MG tablet TAKE 1 TABLET BY MOUTH EVERY DAY 90 tablet 12   No current facility-administered medications for this visit.     No Known Allergies   Past Medical History:  Diagnosis Date  . Anemia yrs ago  . Aortic insufficiency   . Breast cancer of lower-outer quadrant of right female breast ([place]) 10/10/2015   left breat also chemo done for first  . Cough    from toporol  . Heart murmur   . Hypertension   . Osteoporosis   . Spinal headache yrs ago    Past Surgical History:  Procedure Laterality Date  . ABDOMINAL HYSTERECTOMY     total  . APPENDECTOMY    . BREAST RECONSTRUCTION WITH PLACEMENT OF TISSUE EXPANDER AND FLEX HD (ACELLULAR HYDRATED DERMIS) Right 12/13/2015   Procedure: RIGHT BREAST RECONSTRUCTION WITH PLACEMENT OF silicone gel implant and allograft tissue;  Surgeon: Crissie Reese, MD;  Location: [facility];  Service: Plastics;  Laterality: Right;  . BREAST  SURGERY    . CESAREAN SECTION     x 2  . COLONOSCOPY WITH PROPOFOL N/A 04/08/2017   Procedure: COLONOSCOPY WITH PROPOFOL;  Surgeon: Garlan Fair, MD;  Location: [facility];  Service: Endoscopy;  Laterality: N/A;  . HERNIA REPAIR    . MASTECTOMY Left 1979  . TONSILLECTOMY    . TOTAL MASTECTOMY Right 12/13/2015   Procedure: RIGHT MASTECTOMY ;  Surgeon: Stark Klein, MD;  Location: [facility];  Service: General;  Laterality: Right;    Social History   Socioeconomic History  . Marital status: Married    Spouse name: Not on file  . Number of children: 2  . Years of education: Not on file  . Highest education level: Not on file  Occupational History  . Not on file  Social Needs  . Financial resource strain: Not on file  . Food insecurity:    Worry: Not on file    Inability: Not on file  . Transportation needs:    Medical: Not on file    Non-medical: Not on file  Tobacco Use  . Smoking status: Never Smoker  . Smokeless tobacco: Never Used  Substance and Sexual Activity  . Alcohol use: No  . Drug use: No  . Sexual activity: Not on file  Lifestyle  . Physical activity:    Days per week: Not on file    Minutes per session: Not on file  . Stress: Not on  file  Relationships  . Social connections:    Talks on phone: Not on file    Gets together: Not on file    Attends religious service: Not on file    Active member of club or organization: Not on file    Attends meetings of clubs or organizations: Not on file    Relationship status: Not on file  . Intimate partner violence:    Fear of current or ex partner: Not on file    Emotionally abused: Not on file    Physically abused: Not on file    Forced sexual activity: Not on file  Other Topics Concern  . Not on file  Social History Narrative  . Not on file    Family History  Problem Relation Age of Onset  . Other Mother        malignant neoplasm  . Bone cancer Father        malignant neoplasm  . Other Sister         malignant neoplasm    ROS: no fevers or chills, productive cough, hemoptysis, dysphasia, odynophagia, melena, hematochezia, dysuria, hematuria, rash, seizure activity, orthopnea, PND, pedal edema, claudication. Remaining systems are negative.  Physical Exam:   Blood pressure 130/90, pulse 67, height 5\' 1"  (1.549 m), weight 91 lb (41.3 kg).  General:  Well developed/well nourished in NAD Skin warm/dry Patient not depressed No peripheral clubbing Back-normal HEENT-normal/normal eyelids Neck supple/normal carotid upstroke bilaterally; no bruits; no JVD; no thyromegaly chest - CTA/ normal expansion CV - RRR/normal S1 and S2; no rubs or gallops;  PMI nondisplaced, 2/6 diastolic murmur left sternal border. Abdomen -NT/ND, no HSM, no mass, + bowel sounds, no bruit 2+ femoral pulses, no bruits Ext-no edema, chords, 2+ DP Neuro-grossly nonfocal  ECG -normal sinus rhythm at a rate of 67.  Left anterior fascicular block.  No ST changes.  Personally reviewed  A/P  1 hypertension-blood pressure is elevated.  I will add hydrochlorothiazide 12.5 mg daily.  Check potassium and renal function in 2 weeks.  Follow blood pressure and adjust regimen as needed.  2 history of aortic insufficiency-plan repeat echocardiogram to further assess.  3 history of breast cancer-Per primary care.  I will discontinue aspirin as she has no history of vascular disease.  Kirk Ruths, MD

## 2018-08-28 ENCOUNTER — Telehealth: Payer: Self-pay | Admitting: Cardiology

## 2018-08-28 ENCOUNTER — Ambulatory Visit: Payer: Medicare Other | Admitting: Cardiology

## 2018-08-28 ENCOUNTER — Encounter: Payer: Self-pay | Admitting: Cardiology

## 2018-08-28 ENCOUNTER — Encounter

## 2018-08-28 VITALS — BP 130/90 | HR 67 | Ht 61.0 in | Wt 91.0 lb

## 2018-08-28 DIAGNOSIS — I10 Essential (primary) hypertension: Secondary | ICD-10-CM | POA: Diagnosis not present

## 2018-08-28 DIAGNOSIS — I351 Nonrheumatic aortic (valve) insufficiency: Secondary | ICD-10-CM | POA: Diagnosis not present

## 2018-08-28 DIAGNOSIS — Z79899 Other long term (current) drug therapy: Secondary | ICD-10-CM | POA: Diagnosis not present

## 2018-08-28 MED ORDER — HYDROCHLOROTHIAZIDE 12.5 MG PO CAPS: 12.5000 mg | ORAL_CAPSULE | Freq: Every day | ORAL | 3 refills | Status: DC

## 2018-08-28 NOTE — Telephone Encounter (Signed)
New Message   Pt c/o medication issue:  1. Name of Medication: hydrocodone  2. How are you currently taking this medication (dosage and times per day)? 1/4 teaspoons daily  3. Are you having a reaction (difficulty breathing--STAT)? no  4. What is your medication issue? Pt states that she confirmed with her pharmacy that its just 1/4 teaspoon  And no mg

## 2018-08-28 NOTE — Telephone Encounter (Signed)
Left message for pt to call back  °

## 2018-08-28 NOTE — Patient Instructions (Signed)
Medication Instructions: Your Physician recommend you make the following changes to your medication. Stop: Aspirin 81 mg Start: Hydrochlorothiazide 12.5 mg daily   If you need a refill on your cardiac medications before your next appointment, please call your pharmacy.   Labwork: Your physician recommends that you return for lab after vacation (BMP)   Procedures/Testing: Your physician has requested that you have an echocardiogram. Echocardiography is a painless test that uses sound waves to create images of your heart. It provides your doctor with information about the size and shape of your heart and how well your heart's chambers and valves are working. This procedure takes approximately one hour. There are no restrictions for this procedure. [place] [address]   Follow-Up: Your physician wants you to follow-up in 6 monthsw with Dr. Trellis Moment will receive a reminder letter in the mail two months in advance. If you don't receive a letter, please call our office at [phone] to schedule this follow-up appointment.   Special Instructions:    Thank you for choosing [facility] at [facility]!!

## 2018-08-29 MED ORDER — HYDROCOD POLST-CPM POLST ER 10-8 MG/5ML PO SUER: 5.0000 mL | Freq: Every day | ORAL | Status: DC | PRN

## 2018-08-29 NOTE — Telephone Encounter (Signed)
S/w pt she states that she was to CB to add to her cough syrup medication list she states that this is a cough medication that she has been taking "almost daily" for 2 years. It is filled by her PCP.   S/w Robin at [facility] she states that pt is taking Tussionex ( Tussionex (Hydrocodone and Chlorpheniramine))  1/4-1/2 tsp daily prn cough. She states that this is the "standard dose: and it does not have a mg strength. I have added this to her medication list, as this is the only medication in our database that seems to be the medication that she is taking.

## 2018-09-19 LAB — BASIC METABOLIC PANEL
BUN / CREAT RATIO: 16 (ref 12–28)
BUN: 14 mg/dL (ref 8–27)
CHLORIDE: 94 mmol/L — AB (ref 96–106)
CO2: 27 mmol/L (ref 20–29)
Calcium: 10.3 mg/dL (ref 8.7–10.3)
Creatinine, Ser: 0.87 mg/dL (ref 0.57–1.00)
GFR calc Af Amer: 73 mL/min/{1.73_m2} (ref >59)
GFR calc non Af Amer: 64 mL/min/{1.73_m2} (ref >59)
Glucose: 90 mg/dL (ref 65–99)
Potassium: 4.7 mmol/L (ref 3.5–5.2)
SODIUM: 136 mmol/L (ref 134–144)

## 2018-09-22 ENCOUNTER — Encounter: Payer: Self-pay | Admitting: *Deleted

## 2018-10-06 ENCOUNTER — Ambulatory Visit (HOSPITAL_COMMUNITY): Payer: Medicare Other | Attending: Cardiovascular Disease

## 2018-10-06 ENCOUNTER — Other Ambulatory Visit: Payer: Self-pay

## 2018-10-06 DIAGNOSIS — I351 Nonrheumatic aortic (valve) insufficiency: Secondary | ICD-10-CM

## 2018-10-07 ENCOUNTER — Telehealth: Payer: Self-pay | Admitting: Cardiology

## 2018-10-07 NOTE — Telephone Encounter (Signed)
Called patient, advised of ECHO result. Patient had question regarding BP medication, I advised we could get an appointment with an extender to be evaluated, and she refused to see anyone else, Patient states that she will go to PCP and get there opinion first.

## 2018-10-07 NOTE — Telephone Encounter (Signed)
Follow Up ° °Pt returning call for nurse °

## 2018-12-25 ENCOUNTER — Ambulatory Visit: Payer: Medicare Other | Admitting: Pulmonary Disease

## 2018-12-25 ENCOUNTER — Encounter: Payer: Self-pay | Admitting: Pulmonary Disease

## 2018-12-25 VITALS — BP 126/82 | HR 58 | Ht 59.0 in | Wt 91.2 lb

## 2018-12-25 DIAGNOSIS — R0602 Shortness of breath: Secondary | ICD-10-CM

## 2018-12-25 DIAGNOSIS — R05 Cough: Secondary | ICD-10-CM

## 2018-12-25 DIAGNOSIS — R059 Cough, unspecified: Secondary | ICD-10-CM

## 2018-12-25 NOTE — Progress Notes (Signed)
Synopsis: Referred in Jan 2020 for SOB by Sarah Ada, MD  Subjective:   PATIENT ID: Sarah Lewis GENDER: female DOB: 23-Feb-1936, MRN: [id number]  Chief Complaint  Patient presents with  . Consult    Consult for SOB. States she started noticing increased SOB when she started Olmesartan and HCTZ.     PMH of HTN, on medications, h/o breast cancer s/p BL mastectomy. SOB for the past 5-6 months, most notice when walking up steps or up the hill. She feels like she has to stop. Nothing has made it better or worse. She feels all right if she stops to catch her breath. Lives here in [place], [place] years. 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 She does have a cough. Nothing comes up with it. Dry non-productive cough. She really had a bad cough when she was on lisinopril. Much better after coming off this. Life long non-smoker. No alcohol use. Married, 2 children, 5 grandkids.   Past Medical History:  Diagnosis Date  . Anemia yrs ago  . Aortic insufficiency   . Breast cancer of lower-outer quadrant of right female breast ([place]) 10/10/2015   left breat also chemo done for first  . Cough    from toporol  . Heart murmur   . Hypertension   . Osteoporosis   . Spinal headache yrs ago     Family History  Problem Relation Age of Onset  . Other Mother        malignant neoplasm  . Bone cancer Father        malignant neoplasm  . Other Sister        malignant neoplasm     Past Surgical History:  Procedure Laterality Date  . ABDOMINAL HYSTERECTOMY     total  . APPENDECTOMY    . BREAST RECONSTRUCTION WITH PLACEMENT OF TISSUE EXPANDER AND FLEX HD (ACELLULAR HYDRATED DERMIS) Right 12/13/2015   Procedure: RIGHT BREAST RECONSTRUCTION WITH PLACEMENT OF silicone gel implant and allograft tissue;  Surgeon: Crissie Reese, MD;  Location: [facility];  Service: Plastics;  Laterality: Right;  . BREAST SURGERY    . CESAREAN SECTION     x 2  . COLONOSCOPY WITH PROPOFOL N/A  04/08/2017   Procedure: COLONOSCOPY WITH PROPOFOL;  Surgeon: Garlan Fair, MD;  [facility];  Service: Endoscopy;  Laterality: N/A;  . HERNIA REPAIR    . MASTECTOMY Left 1979  . TONSILLECTOMY    . TOTAL MASTECTOMY Right 12/13/2015   Procedure: RIGHT MASTECTOMY ;  Surgeon: Stark Klein, MD;  Location: [facility];  Service: General;  Laterality: Right;    Social History   Socioeconomic History  . Marital status: Married    Spouse name: Not on file  . Number of children: 2  . Years of education: Not on file  . Highest education level: Not on file  Occupational History  . Not on file  Social Needs  . Financial resource strain: Not on file  . Food insecurity:    Worry: Not on file    Inability: Not on file  . Transportation needs:    Medical: Not on file    Non-medical: Not on file  Tobacco Use  . Smoking status: Never Smoker  . Smokeless tobacco: Never Used  Substance and Sexual Activity  . Alcohol use: No  . Drug use: No  . Sexual activity: Not on file  Lifestyle  . Physical activity:    Days per week: Not on file  Minutes per session: Not on file  . Stress: Not on file  Relationships  . Social connections:    Talks on phone: Not on file    Gets together: Not on file    Attends religious service: Not on file    Active member of club or organization: Not on file    Attends meetings of clubs or organizations: Not on file    Relationship status: Not on file  . Intimate partner violence:    Fear of current or ex partner: Not on file    Emotionally abused: Not on file    Physically abused: Not on file    Forced sexual activity: Not on file  Other Topics Concern  . Not on file  Social History Narrative  . Not on file     No Known Allergies   Outpatient Medications Prior to Visit  Medication Sig Dispense Refill  . Calcium Carb-Cholecalciferol (CALCIUM-VITAMIN D) 500-400 MG-UNIT TABS Take 1 tablet by mouth 2 (two) times daily.    .  chlorpheniramine-HYDROcodone (TUSSIONEX PENNKINETIC ER) 10-8 MG/5ML SUER Take 5 mLs by mouth daily as needed for cough (1/4-1/2 tsp daily PRN cough). 140 mL   . cholecalciferol (VITAMIN D) 400 units TABS tablet Take 400 Units by mouth daily.    . metoprolol succinate (TOPROL-XL) 25 MG 24 hr tablet Take 25 mg by mouth daily.    . metoprolol succinate (TOPROL-XL) 50 MG 24 hr tablet Take 50 mg by mouth daily.   0  . OLMESARTAN MEDOXOMIL PO Take 40 mg by mouth every other day.     . tamoxifen (NOLVADEX) 20 MG tablet TAKE 1 TABLET BY MOUTH EVERY DAY 90 tablet 12  . hydrochlorothiazide (MICROZIDE) 12.5 MG capsule Take 1 capsule (12.5 mg total) by mouth daily. 90 capsule 3  . aspirin 81 MG tablet Take 81 mg by mouth daily.     No facility-administered medications prior to visit.     Review of Systems  Constitutional: Negative for chills, fever, malaise/fatigue and weight loss.  HENT: Negative for hearing loss, sore throat and tinnitus.   Eyes: Negative for blurred vision and double vision.  Respiratory: Positive for cough. Negative for hemoptysis, sputum production, shortness of breath, wheezing and stridor.   Cardiovascular: Negative for chest pain, palpitations, orthopnea, leg swelling and PND.  Gastrointestinal: Negative for abdominal pain, constipation, diarrhea, heartburn, nausea and vomiting.  Genitourinary: Negative for dysuria, hematuria and urgency.  Musculoskeletal: Negative for joint pain and myalgias.  Skin: Negative for itching and rash.  Neurological: Negative for dizziness, tingling, weakness and headaches.  Endo/Heme/Allergies: Negative for environmental allergies. Does not bruise/bleed easily.  Psychiatric/Behavioral: Negative for depression. The patient is not nervous/anxious and does not have insomnia.   All other systems reviewed and are negative.    Objective:  Physical Exam Vitals signs reviewed.  Constitutional:      General: She is not in acute distress.     Appearance: She is well-developed.  HENT:     Head: Normocephalic and atraumatic.  Eyes:     General: No scleral icterus.    Conjunctiva/sclera: Conjunctivae normal.     Pupils: Pupils are equal, round, and reactive to light.  Neck:     Musculoskeletal: Neck supple.     Vascular: No JVD.     Trachea: No tracheal deviation.  Cardiovascular:     Rate and Rhythm: Normal rate and regular rhythm.     Heart sounds: Murmur present.  Pulmonary:     Effort: Pulmonary  effort is normal. No tachypnea, accessory muscle usage or respiratory distress.     Breath sounds: Normal breath sounds. No stridor. No wheezing, rhonchi or rales.  Abdominal:     General: Bowel sounds are normal. There is no distension.     Palpations: Abdomen is soft.     Tenderness: There is no abdominal tenderness.  Musculoskeletal:        General: No tenderness.  Lymphadenopathy:     Cervical: No cervical adenopathy.  Skin:    General: Skin is warm and dry.     Capillary Refill: Capillary refill takes less than 2 seconds.     Findings: No rash.  Neurological:     Mental Status: She is alert and oriented to person, place, and time.  Psychiatric:        Behavior: Behavior normal.      Vitals:   12/25/18 1501  BP: 126/82  Pulse: (!) 58  SpO2: 98%  Weight: 91 lb 3.2 oz (41.4 kg)  Height: 4\' 11"  (1.499 m)   98% on RA BMI Readings from Last 3 Encounters:  12/25/18 18.42 kg/m  08/28/18 17.19 kg/m  05/08/18 17.31 kg/m   Wt Readings from Last 3 Encounters:  12/25/18 91 lb 3.2 oz (41.4 kg)  08/28/18 91 lb (41.3 kg)  05/08/18 91 lb 9.6 oz (41.5 kg)     CBC    Component Value Date/Time   WBC 6.6 05/08/2018 1420   WBC 6.8 05/01/2017 1341   WBC 7.3 12/07/2015 1052   RBC 3.96 05/08/2018 1420   HGB 12.4 05/08/2018 1420   HGB 12.5 05/01/2017 1341   HCT 36.8 05/08/2018 1420   HCT 37.0 05/01/2017 1341   PLT 215 05/08/2018 1420   PLT 242 05/01/2017 1341   MCV 93.0 05/08/2018 1420   MCV 91.9 05/01/2017  1341   MCH 31.2 05/08/2018 1420   MCHC 33.6 05/08/2018 1420   RDW 13.0 05/08/2018 1420   RDW 12.8 05/01/2017 1341   LYMPHSABS 1.5 05/08/2018 1420   LYMPHSABS 1.8 05/01/2017 1341   MONOABS 0.5 05/08/2018 1420   MONOABS 0.5 05/01/2017 1341   EOSABS 0.1 05/08/2018 1420   EOSABS 0.1 05/01/2017 1341   BASOSABS 0.1 05/08/2018 1420   BASOSABS 0.1 05/01/2017 1341    Chest Imaging: CXR - 2016  - no active process The patient's images have been independently reviewed by me.    Pulmonary Functions Testing Results: No flowsheet data found.  FeNO: None   Pathology: None   Echocardiogram:  09/2018 Study Conclusions  - Left ventricle: The cavity size was normal. Systolic function was   normal. The estimated ejection fraction was in the range of 60%   to 65%. Wall motion was normal; there were no regional wall   motion abnormalities. Doppler parameters are consistent with   abnormal left ventricular relaxation (grade 1 diastolic   dysfunction). Doppler parameters are consistent with high   ventricular filling pressure. - Aortic valve: Transvalvular velocity was within the normal range.   There was no stenosis. There was mild regurgitation. - Mitral valve: Transvalvular velocity was within the normal range.   There was no evidence for stenosis. There was no regurgitation. - Left atrium: The atrium was moderately dilated. - Right ventricle: The cavity size was mildly dilated. Wall   thickness was normal. Systolic function was normal. - Tricuspid valve: There was moderate regurgitation. - Pulmonary arteries: Systolic pressure was mildly increased. PA   peak pressure: 48 mm Hg (S). - Global longitudinal strain -  19.4% (normal).  Heart Catheterization: None     Assessment & Plan:   Cough  SOB (shortness of breath) - Plan: Pulmonary Function Test  Discussion:  This is an 83 year old female with a longstanding history of cough.  She does believe that all of her cough started when  she was initially placed on an ACE inhibitor.  And she has never recovered from this.  At this time she feels like her medications is what induces her cough.  But she is unclear.  She denies GERD or reflux symptoms.  She has no postnasal drip or any allergies.  She has seen allergy in the past with significant allergy testing.  All of which was negative per the patient.  Her cough is now better and now only episodic.  She does have evidence of mildly increased peak systolic pressures in her pulmonary arteries.  This may be a component to her dyspnea on exertion.  And shortness of breath.  I think we should do a stepwise evaluation: We will start with obtaining pulmonary function tests.  If there is any abnormality in the pulmonary function test such as a reduced DLCO we could consider further evaluation of pulmonary hypertension as a cause for her dyspnea.  If there is any significant spirometry or lung volume abnormalities would consider axial imaging of the chest for history of chronic cough.  Her cough however is better and now only episodic.  Patient to follow-up in 2 weeks with nurse practitioner to review PFTs and plans for next steps.  Greater than 50% of this patient's 45-minute office visit was spent face-to-face discussing the recommendations and treatment.   Current Outpatient Medications:  .  Calcium Carb-Cholecalciferol (CALCIUM-VITAMIN D) 500-400 MG-UNIT TABS, Take 1 tablet by mouth 2 (two) times daily., Disp: , Rfl:  .  chlorpheniramine-HYDROcodone (TUSSIONEX PENNKINETIC ER) 10-8 MG/5ML SUER, Take 5 mLs by mouth daily as needed for cough (1/4-1/2 tsp daily PRN cough)., Disp: 140 mL, Rfl:  .  cholecalciferol (VITAMIN D) 400 units TABS tablet, Take 400 Units by mouth daily., Disp: , Rfl:  .  metoprolol succinate (TOPROL-XL) 25 MG 24 hr tablet, Take 25 mg by mouth daily., Disp: , Rfl:  .  metoprolol succinate (TOPROL-XL) 50 MG 24 hr tablet, Take 50 mg by mouth daily. , Disp: , Rfl:  0 .  OLMESARTAN MEDOXOMIL PO, Take 40 mg by mouth every other day. , Disp: , Rfl:  .  tamoxifen (NOLVADEX) 20 MG tablet, TAKE 1 TABLET BY MOUTH EVERY DAY, Disp: 90 tablet, Rfl: 12 .  hydrochlorothiazide (MICROZIDE) 12.5 MG capsule, Take 1 capsule (12.5 mg total) by mouth daily., Disp: 90 capsule, Rfl: 3   Garner Nash, DO [facility] 12/25/2018 3:28 PM

## 2018-12-25 NOTE — Patient Instructions (Addendum)
Thank you for visiting Dr. Valeta Harms at [facility]. Today we recommend the following:  Orders Placed This Encounter  Procedures  . Pulmonary Function Test    Return in about 2 weeks (around 01/08/2019). Please schedule with NP.

## 2019-01-07 NOTE — Progress Notes (Signed)
@Patient  ID: Sarah Lewis, female    DOB: 1936-03-05, 83 y.o.   MRN: [id number]  No chief complaint on file.   Referring provider: Carol Ada, MD  HPI:  83 year old never smoker referred to our office on 12/25/2018 for evaluation of chronic cough  PMH: Breast Cancer Smoker/ Smoking History: Never smoker Maintenance:  None  Pt of: Dr. Valeta Harms   01/08/2019  - Visit   83 year old female never smoker presenting to our office today after completing pulmonary function testing.  Pulmonary function testing listed below:  01/08/2019-pulmonary function test-FVC 1.08 (53% predicted), postbronchodilator ratio 80, postbronchodilator FEV1 0.88 (59% predicted), no significant bronchodilator response, mid flow reversibility following bronchodilator administration, patient denies clinical improvement when using bronchodilator, patient unable to complete DLCO >>> Not currently managed on any inhalers at this time  Patient is up-to-date with her vaccines.  Patient received a pneumonia vaccine earlier this week from primary care.  Patient reports she still does have a dry cough but this is typically managed by taking 0.25 of her ordered dose of Tussionex in the morning to help suppress her cough.  Patient reports that when she does not take the Desenex she does cough more.  Patient denies any pattern to the cough.  Patient is unsure if it is an inspiratory or expiratory cough.  Patient denies any seasonal aspect to the issues with her cough or with her breathing.  Patient denies having allergies or acid reflux.  Patient does admit that when she discussed the Tussionex with her primary care provider they mentioned that she may be seen some improvement due to the antihistamine that is in the Tussionex.   Tests:   01/08/2019-pulmonary function test-FVC 1.08 (53% predicted), postbronchodilator ratio 80, postbronchodilator FEV1 0.88 (59% predicted), no significant bronchodilator response, mid flow  reversibility following bronchodilator administration, patient denies clinical improvement when using bronchodilator, patient unable to complete DLCO >>> Not currently managed on any inhalers at this time  Chest Imaging: CXR - 2016 - no acute changes   Echocardiogram:  09/2018 Study Conclusions  - Left ventricle: The cavity size was normal. Systolic function was normal. The estimated ejection fraction was in the range of 60% to 65%. Wall motion was normal; there were no regional wall motion abnormalities. Doppler parameters are consistent with abnormal left ventricular relaxation (grade 1 diastolic dysfunction). Doppler parameters are consistent with high ventricular filling pressure. - Aortic valve: Transvalvular velocity was within the normal range. There was no stenosis. There was mild regurgitation. - Mitral valve: Transvalvular velocity was within the normal range. There was no evidence for stenosis. There was no regurgitation. - Left atrium: The atrium was moderately dilated. - Right ventricle: The cavity size was mildly dilated. Wall thickness was normal. Systolic function was normal. - Tricuspid valve: There was moderate regurgitation. - Pulmonary arteries: Systolic pressure was mildly increased. PA peak pressure: 48 mm Hg (S). - Global longitudinal strain -19.4% (normal).  Heart Catheterization: None   FENO:  No results found for: NITRICOXIDE  PFT: PFT Results Latest Ref Rng & Units 01/08/2019  FVC-Pre L 1.08  FVC-Predicted Pre % 53  FVC-Post L 1.10  FVC-Predicted Post % 54  Pre FEV1/FVC % % 76  Post FEV1/FCV % % 80  FEV1-Pre L 0.82  FEV1-Predicted Pre % 55  FEV1-Post L 0.88  TLC L 3.65  TLC % Predicted % 84  RV % Predicted % 120    Imaging: No results found.    Specialty Problems  Pulmonary Problems   Cough    Centricity Description: COUGH Qualifier: Diagnosis of  By: Annamaria Boots MD, Clinton D  Centricity Description: COUGH,  MILD Qualifier: Diagnosis of  By: Garen Grams        BRONCHITIS    Qualifier: Diagnosis of  By: Annamaria Boots MD, Elicia Lamp D    Qualifier: Diagnosis of  By: Annamaria Boots MD, Clinton D       RHINITIS    Qualifier: Diagnosis of  By: Annamaria Boots MD, Clinton D       Interstitial pulmonary disease ([place])      No Known Allergies  Immunization History  Administered Date(s) Administered  . Influenza, High Dose Seasonal PF 08/25/2018  . Zoster Recombinat (Shingrix) 12/23/2017, 07/22/2018   Patient reports she received a Pneumovax23 vaccine her primary care office will contact them to receive these records  Past Medical History:  Diagnosis Date  . Anemia yrs ago  . Aortic insufficiency   . Breast cancer of lower-outer quadrant of right female breast ([place]) 10/10/2015   left breat also chemo done for first  . Cough    from toporol  . Heart murmur   . Hypertension   . Osteoporosis   . Spinal headache yrs ago    Tobacco History: Social History   Tobacco Use  Smoking Status Never Smoker  Smokeless Tobacco Never Used   Counseling given: Not Answered  Continue to not smoke  Outpatient Encounter Medications as of 01/08/2019  Medication Sig  . Calcium Carb-Cholecalciferol (CALCIUM-VITAMIN D) 500-400 MG-UNIT TABS Take 1 tablet by mouth 2 (two) times daily.  . chlorpheniramine-HYDROcodone (TUSSIONEX PENNKINETIC ER) 10-8 MG/5ML SUER Take 5 mLs by mouth daily as needed for cough (1/4-1/2 tsp daily PRN cough).  . metoprolol succinate (TOPROL-XL) 25 MG 24 hr tablet Take 25 mg by mouth daily.  . metoprolol succinate (TOPROL-XL) 50 MG 24 hr tablet Take 50 mg by mouth daily.   [name] Kitchen OLMESARTAN MEDOXOMIL PO Take 40 mg by mouth every other day.   . tamoxifen (NOLVADEX) 20 MG tablet TAKE 1 TABLET BY MOUTH EVERY DAY  . [DISCONTINUED] cholecalciferol (VITAMIN D) 400 units TABS tablet Take 400 Units by mouth daily.  . benzonatate (TESSALON) 200 MG capsule Take 1 capsule (200 mg total) by  mouth 3 (three) times daily as needed for cough.  . [DISCONTINUED] hydrochlorothiazide (MICROZIDE) 12.5 MG capsule Take 1 capsule (12.5 mg total) by mouth daily.   No facility-administered encounter medications on file as of 01/08/2019.      Review of Systems  Review of Systems  Constitutional: Negative for fatigue and fever.  HENT: Negative for congestion, sinus pressure and sinus pain.   Respiratory: Positive for cough (dry cough persists ). Negative for shortness of breath.   Cardiovascular: Negative for chest pain and palpitations.  Gastrointestinal: Negative for diarrhea, nausea and vomiting.  Allergic/Immunologic: Negative for environmental allergies and food allergies.     Physical Exam  BP 106/78 (BP Location: Left Arm, Cuff Size: Normal)   Pulse 74   Ht 4\' 11"  (1.499 m)   Wt 92 lb (41.7 kg)   SpO2 94%   BMI 18.58 kg/m   Wt Readings from Last 5 Encounters:  01/08/19 92 lb (41.7 kg)  12/25/18 91 lb 3.2 oz (41.4 kg)  08/28/18 91 lb (41.3 kg)  05/08/18 91 lb 9.6 oz (41.5 kg)  05/08/17 93 lb 14.4 oz (42.6 kg)    Physical Exam  Constitutional: She is oriented  to person, place, and time and well-developed, well-nourished, and in no distress. Vital signs are normal. She does not have a sickly appearance. No distress.  + Thin elderly female  HENT:  Head: Normocephalic and atraumatic.  Right Ear: Hearing, tympanic membrane, external ear and ear canal normal.  Left Ear: Hearing, tympanic membrane, external ear and ear canal normal.  Nose: Mucosal edema present. Right sinus exhibits no maxillary sinus tenderness and no frontal sinus tenderness. Left sinus exhibits no maxillary sinus tenderness and no frontal sinus tenderness.  Mouth/Throat: Uvula is midline and oropharynx is clear and moist. No oropharyngeal exudate.  + Postnasal drip  Eyes: Pupils are equal, round, and reactive to light.  Neck: Normal range of motion. Neck supple. No JVD present.  Cardiovascular: Normal  rate, regular rhythm and normal heart sounds.  Pulmonary/Chest: Effort normal and breath sounds normal. No accessory muscle usage. No respiratory distress. She has no decreased breath sounds. She has no wheezes. She has no rhonchi. She has no rales.  Musculoskeletal: Normal range of motion.        General: No edema.     Comments: +bouchard and herberden nodes bilaterally   Lymphadenopathy:    She has no cervical adenopathy.  Neurological: She is alert and oriented to person, place, and time. Gait normal.  Skin: Skin is warm and dry. She is not diaphoretic. No erythema. Nails show clubbing (Questionable minor clubbing).  Psychiatric: Mood, memory, affect and judgment normal.  Nursing note and vitals reviewed.   01/08/2019-walk in office- patient was able to complete 3 laps on room air, patient's heart rate remained stable but oxygenation did drop from 100% to 92% during the walk  Lab Results:  CBC    Component Value Date/Time   WBC 6.6 05/08/2018 1420   WBC 6.8 05/01/2017 1341   WBC 7.3 12/07/2015 1052   RBC 3.96 05/08/2018 1420   HGB 12.4 05/08/2018 1420   HGB 12.5 05/01/2017 1341   HCT 36.8 05/08/2018 1420   HCT 37.0 05/01/2017 1341   PLT 215 05/08/2018 1420   PLT 242 05/01/2017 1341   MCV 93.0 05/08/2018 1420   MCV 91.9 05/01/2017 1341   MCH 31.2 05/08/2018 1420   MCHC 33.6 05/08/2018 1420   RDW 13.0 05/08/2018 1420   RDW 12.8 05/01/2017 1341   LYMPHSABS 1.5 05/08/2018 1420   LYMPHSABS 1.8 05/01/2017 1341   MONOABS 0.5 05/08/2018 1420   MONOABS 0.5 05/01/2017 1341   EOSABS 0.1 05/08/2018 1420   EOSABS 0.1 05/01/2017 1341   BASOSABS 0.1 05/08/2018 1420   BASOSABS 0.1 05/01/2017 1341    BMET    Component Value Date/Time   NA 136 09/19/2018 1025   NA 139 05/01/2017 1341   K 4.7 09/19/2018 1025   K 4.3 05/01/2017 1341   CL 94 (L) 09/19/2018 1025   CO2 27 09/19/2018 1025   CO2 30 (H) 05/01/2017 1341   GLUCOSE 90 09/19/2018 1025   GLUCOSE 104 05/08/2018 1420    GLUCOSE 101 05/01/2017 1341   BUN 14 09/19/2018 1025   BUN 15.6 05/01/2017 1341   CREATININE 0.87 09/19/2018 1025   CREATININE 0.80 05/08/2018 1420   CREATININE 1.0 05/01/2017 1341   CALCIUM 10.3 09/19/2018 1025   CALCIUM 9.3 05/01/2017 1341   GFRNONAA 64 09/19/2018 1025   GFRNONAA >60 05/08/2018 1420   GFRAA 73 09/19/2018 1025   GFRAA >60 05/08/2018 1420    BNP No results found for: BNP  ProBNP No results found for: PROBNP  Assessment & Plan:   Cough Assessment: Continued dry cough No seasonal aspect to cough Resolves when taking Tussionex Rhinorrhea and nasal mucosal edema on exam today Asthmatic component on pulmonary function test today with mid flow reversibility Lungs clear on auscultation today  Plan: We will order high-res CT to further evaluate cough, shortness of breath, restrictive pattern on lungs today We will hold off starting inhalers at this time as patient would like to limit the amount of medication that she is on and she is not wheezing Tessalon Perles to be used every 8 hours as needed for cough Patient to start daily antihistamine Can use Tussionex at night for suppression of cough Follow-up in 4 to 6 weeks after completing CT    Interstitial pulmonary disease ([place]) Assessment: Continued chronic dry cough Unsure if it is an inspiratory cough Restrictive pattern on pulmonary function test today-FVC 53% predicted Patient denies any family history of lung disease Patient is a never smoker No known exposure history for potential ILD Patient denies acid reflux Lungs clear to auscultation today Walk in office today patient's oxygen saturations did drop from 100-92 when completing 3 laps on room air, heart rate remained stable Elevated PAP pressures on recent echocardiogram Slight clubbing on distal fingernails  Plan: We will order a high-resolution CT to further evaluate patient's cough, shortness of breath as well as oxygen desaturations  when walking on room air >>> If high-res CT is positive for ILD will need ILD questionnaire Can still consider further work-up for pulmonary hypertension based off of elevated PA P pressure after CT imaging Tessalon Perles called in to be used every 8 hours as needed for cough Can continue to use Tussionex as needed for cough at night Follow-up with our office in 4 to 6 weeks after completing CT   RHINITIS Assessment: Rhinorrhea and nasal mucosal edema on exam today Slight postnasal drip  Plan: Start daily antihistamine      Lauraine Rinne, NP 01/08/2019   This appointment was 35 minutes long with over 50% of the time in direct face-to-face patient care, assessment, plan of care, and follow-up.

## 2019-01-08 ENCOUNTER — Ambulatory Visit (INDEPENDENT_AMBULATORY_CARE_PROVIDER_SITE_OTHER): Payer: Medicare Other | Admitting: Pulmonary Disease

## 2019-01-08 ENCOUNTER — Encounter: Payer: Self-pay | Admitting: Pulmonary Disease

## 2019-01-08 ENCOUNTER — Ambulatory Visit: Payer: Medicare Other | Admitting: Pulmonary Disease

## 2019-01-08 VITALS — BP 106/78 | HR 74 | Ht 59.0 in | Wt 92.0 lb

## 2019-01-08 DIAGNOSIS — R0602 Shortness of breath: Secondary | ICD-10-CM | POA: Diagnosis not present

## 2019-01-08 DIAGNOSIS — R059 Cough, unspecified: Secondary | ICD-10-CM

## 2019-01-08 DIAGNOSIS — J310 Chronic rhinitis: Secondary | ICD-10-CM | POA: Diagnosis not present

## 2019-01-08 DIAGNOSIS — J849 Interstitial pulmonary disease, unspecified: Secondary | ICD-10-CM | POA: Diagnosis not present

## 2019-01-08 DIAGNOSIS — R05 Cough: Secondary | ICD-10-CM

## 2019-01-08 LAB — PULMONARY FUNCTION TEST
FEF 25-75 POST: 0.85 L/s
FEF 25-75 Pre: 0.64 L/sec
FEF2575-%Change-Post: 32 %
FEF2575-%PRED-POST: 75 %
FEF2575-%PRED-PRE: 56 %
FEV1-%Change-Post: 7 %
FEV1-%PRED-PRE: 55 %
FEV1-%Pred-Post: 59 %
FEV1-POST: 0.88 L
FEV1-Pre: 0.82 L
FEV1FVC-%CHANGE-POST: 5 %
FEV1FVC-%PRED-PRE: 102 %
FEV6-%Change-Post: 2 %
FEV6-%Pred-Post: 58 %
FEV6-%Pred-Pre: 56 %
FEV6-Post: 1.1 L
FEV6-Pre: 1.08 L
FEV6FVC-%PRED-POST: 106 %
FEV6FVC-%Pred-Pre: 106 %
FVC-%Change-Post: 1 %
FVC-%PRED-POST: 54 %
FVC-%PRED-PRE: 53 %
FVC-POST: 1.1 L
FVC-PRE: 1.08 L
PRE FEV1/FVC RATIO: 76 %
Post FEV1/FVC ratio: 80 %
Post FEV6/FVC ratio: 100 %
Pre FEV6/FVC Ratio: 100 %
RV % pred: 120 %
RV: 2.59 L
TLC % pred: 84 %
TLC: 3.65 L

## 2019-01-08 MED ORDER — BENZONATATE 200 MG PO CAPS: 200.0000 mg | ORAL_CAPSULE | Freq: Three times a day (TID) | ORAL | 1 refills | Status: DC | PRN

## 2019-01-08 NOTE — Assessment & Plan Note (Signed)
Assessment: Rhinorrhea and nasal mucosal edema on exam today Slight postnasal drip  Plan: Start daily antihistamine

## 2019-01-08 NOTE — Patient Instructions (Signed)
We will order a high-res CT to evaluate your chronic cough and restriction seen on your pulmonary function test  Walk today in office to monitor oxygen saturations  I have called in a prescription for Tessalon Perles >>>Can use 1 every 8 hours as needed for cough  Please start taking a daily antihistamine (to help manage your allergies):  >>>choose one of: zyrtec, claritin, allegra, or xyzal  >>>these are over the counter medications  >>>can choose generic option  >>>take daily  >>>this medication helps with allergies, post nasal drip, and cough   Can continue to use Tussionex as prescribed at night for management of cough >>>This is a sedating medication  Follow-up with our office in 4 to 6 weeks after completing your high resolution CT or sooner if symptoms worsen  It is flu season:   >>>Remember to be washing your hands regularly, using hand sanitizer, be careful to use around herself with has contact with people who are sick will increase her chances of getting sick yourself. >>> Best ways to protect herself from the flu: Receive the yearly flu vaccine, practice good hand hygiene washing with soap and also using hand sanitizer when available, eat a nutritious meals, get adequate rest, hydrate appropriately   Please contact the office if your symptoms worsen or you have concerns that you are not improving.   Thank you for choosing [facility] for your healthcare, and for allowing [place] to partner with you on your healthcare journey. I am thankful to be able to provide care to you today.   Wyn Quaker FNP-C

## 2019-01-08 NOTE — Assessment & Plan Note (Signed)
Assessment: Continued chronic dry cough Unsure if it is an inspiratory cough Restrictive pattern on pulmonary function test today-FVC 53% predicted Patient denies any family history of lung disease Patient is a never smoker No known exposure history for potential ILD Patient denies acid reflux Lungs clear to auscultation today Walk in office today patient's oxygen saturations did drop from 100-92 when completing 3 laps on room air, heart rate remained stable Elevated PAP pressures on recent echocardiogram Slight clubbing on distal fingernails  Plan: We will order a high-resolution CT to further evaluate patient's cough, shortness of breath as well as oxygen desaturations when walking on room air >>> If high-res CT is positive for ILD will need ILD questionnaire Can still consider further work-up for pulmonary hypertension based off of elevated PA P pressure after CT imaging Tessalon Perles called in to be used every 8 hours as needed for cough Can continue to use Tussionex as needed for cough at night Follow-up with our office in 4 to 6 weeks after completing CT

## 2019-01-08 NOTE — Assessment & Plan Note (Signed)
Assessment: Continued dry cough No seasonal aspect to cough Resolves when taking Tussionex Rhinorrhea and nasal mucosal edema on exam today Asthmatic component on pulmonary function test today with mid flow reversibility Lungs clear on auscultation today  Plan: We will order high-res CT to further evaluate cough, shortness of breath, restrictive pattern on lungs today We will hold off starting inhalers at this time as patient would like to limit the amount of medication that she is on and she is not wheezing Tessalon Perles to be used every 8 hours as needed for cough Patient to start daily antihistamine Can use Tussionex at night for suppression of cough Follow-up in 4 to 6 weeks after completing CT

## 2019-01-08 NOTE — Progress Notes (Signed)
Pt completed PFT today. Patient was unable to complete the DLCO, tried multiply times and tried different ways of instruction. Pt was unable to blow complete breath out then take deep breath in to meet the DLCO requirements.

## 2019-01-17 NOTE — Progress Notes (Signed)
PCCM:  Agree. Thanks for seeing her.  [facility] 01/17/2019 6:32 PM

## 2019-01-22 ENCOUNTER — Other Ambulatory Visit: Payer: Medicare Other

## 2019-01-23 ENCOUNTER — Other Ambulatory Visit: Payer: Medicare Other

## 2019-01-23 ENCOUNTER — Ambulatory Visit (INDEPENDENT_AMBULATORY_CARE_PROVIDER_SITE_OTHER)
Admission: RE | Admit: 2019-01-23 | Discharge: 2019-01-23 | Disposition: A | Discharge status: Home / Self Care | Payer: Medicare Other | Source: Ambulatory Visit | Attending: Pulmonary Disease | Admitting: Pulmonary Disease

## 2019-01-23 DIAGNOSIS — R05 Cough: Secondary | ICD-10-CM | POA: Diagnosis not present

## 2019-01-23 DIAGNOSIS — R059 Cough, unspecified: Secondary | ICD-10-CM

## 2019-01-23 DIAGNOSIS — J849 Interstitial pulmonary disease, unspecified: Secondary | ICD-10-CM

## 2019-01-30 NOTE — Progress Notes (Signed)
There is no evidence of interstitial lung disease on your high-resolution CT.  This is good news.  No current changes in plan of care.  We will discuss CT results further at next office visit.  Wyn Quaker, FNP

## 2019-02-05 NOTE — Progress Notes (Signed)
@Patient  ID: Sarah Lewis, female    DOB: [birth year], 83 y.o.   MRN: [id number]  Chief Complaint  Patient presents with  . Follow-up    Cough, rev High Res CT results     Referring provider: Carol Ada, MD  HPI:  83 year old never smoker referred to our office on 12/25/2018 for evaluation of chronic cough  PMH: Breast Cancer Smoker/ Smoking History: Never smoker Maintenance:  None  Pt of: Dr. Valeta Harms   02/06/2019  - Visit   83 year old female never smoker presenting to our office today for follow-up visit.  Since last office visit patient has completed a high-resolution CT scan that does not show interstitial lung disease.  Patient does have restriction on pulmonary function testing.  As well as an FEV1 of 0.88 (59% predicted), with slight mid flow reversibility after bronchodilator.  Patient has no recorded FeNO on file.  Patient denies having allergies but does feel that the Tussionex has helped with management of nasal stuffiness.  Patient did not start a daily antihistamine after last office visit she reports that she is tried them before and they did not work.  Patient has not tried Flonase or nasal saline rinses.  Patient reports that she is still active and goes to the gym 2-3 times a week.  Patient still actively works outside in her yard daily.  Patient denies having worsened acute symptoms.    Tests:   01/08/2019-pulmonary function test-FVC 1.08 (53% predicted), postbronchodilator ratio 80, postbronchodilator FEV1 0.88 (59% predicted), no significant bronchodilator response, mid flow reversibility following bronchodilator administration, patient denies clinical improvement when using bronchodilator, patient unable to complete DLCO >>> Not currently managed on any inhalers at this time  Chest Imaging: CXR - 2016 - no acute changes   01/23/2019-CT chest high resolution- no evidence of fibrotic interstitial lung disease, clustered nodules and small consolidations of the  lingula and medial right middle and right upper lobes biapical pleural-parenchymal scarring findings generally in keeping with an atypical infection including atypical Mycobacterium, mild scattered air trapping which may be suggestive of small airways disease, coronary artery disease  Echocardiogram:  09/2018 Study Conclusions  - Left ventricle: The cavity size was normal. Systolic function was normal. The estimated ejection fraction was in the range of 60% to 65%. Wall motion was normal; there were no regional wall motion abnormalities. Doppler parameters are consistent with abnormal left ventricular relaxation (grade 1 diastolic dysfunction). Doppler parameters are consistent with high ventricular filling pressure. - Aortic valve: Transvalvular velocity was within the normal range. There was no stenosis. There was mild regurgitation. - Mitral valve: Transvalvular velocity was within the normal range. There was no evidence for stenosis. There was no regurgitation. - Left atrium: The atrium was moderately dilated. - Right ventricle: The cavity size was mildly dilated. Wall thickness was normal. Systolic function was normal. - Tricuspid valve: There was moderate regurgitation. - Pulmonary arteries: Systolic pressure was mildly increased. PA peak pressure: 48 mm Hg (S). - Global longitudinal strain -19.4% (normal).  Heart Catheterization: None   01/08/2019-walk in office- patient was able to complete 3 laps on room air, patient's heart rate remained stable but oxygenation did drop from 100% to 92% during the walk  FENO:  Lab Results  Component Value Date   NITRICOXIDE 11 02/06/2019    PFT: PFT Results Latest Ref Rng & Units 01/08/2019  FVC-Pre L 1.08  FVC-Predicted Pre % 53  FVC-Post L 1.10  FVC-Predicted Post % 54  Pre FEV1/FVC % %  76  Post FEV1/FCV % % 80  FEV1-Pre L 0.82  FEV1-Predicted Pre % 55  FEV1-Post L 0.88  TLC L 3.65  TLC % Predicted % 84    RV % Predicted % 120    Imaging: Ct Chest High Resolution  Result Date: 01/23/2019 CLINICAL DATA:  Chronic dyspnea, evaluate for interstitial lung disease EXAM: CT CHEST WITHOUT CONTRAST TECHNIQUE: Multidetector CT imaging of the chest was performed following the standard protocol without intravenous contrast. High resolution imaging of the lungs, as well as inspiratory and expiratory imaging, was performed. COMPARISON:  Chest radiograph, 10/12/2015 FINDINGS: Cardiovascular: Scattered coronary artery calcifications. Normal heart size. No pericardial effusion. Mediastinum/Nodes: No enlarged mediastinal, hilar, or axillary lymph nodes. Thyroid gland, trachea, and esophagus demonstrate no significant findings. Lungs/Pleura: Pulmonary hyperinflation. There are clustered nodules and small consolidations of the lingula and medial right middle and right upper lobes. Biapical pleural-parenchymal scarring. Mild, scattered air trapping. No pleural effusion or pneumothorax. Upper Abdomen: No acute abnormality. Musculoskeletal: No chest wall mass or suspicious bone lesions identified. Bilateral breast implants. IMPRESSION: 1.  No evidence of fibrotic interstitial lung disease. 2. There are clustered nodules and small consolidations of the lingula and medial right middle and right upper lobes. Biapical pleural-parenchymal scarring. Findings are generally in keeping with atypical infection, including atypical mycobacterium, of uncertain acuity. 3. Mild, scattered air trapping, in combination with pulmonary hyperinflation suggestive of small airways disease. 4.  Coronary artery disease. Electronically Signed   By: Eddie Candle M.D.   On: 01/23/2019 14:48      Specialty Problems      Pulmonary Problems   Cough    Centricity Description: COUGH Qualifier: Diagnosis of  By: Annamaria Boots MD, Kasandra Knudsen  Centricity Description: COUGH, MILD Qualifier: Diagnosis of  By: Garen Grams        BRONCHITIS    Qualifier:  Diagnosis of  By: Annamaria Boots MD, Clinton D       RHINITIS    Qualifier: Diagnosis of  By: Annamaria Boots MD, Clinton D          No Known Allergies  Immunization History  Administered Date(s) Administered  . Influenza, High Dose Seasonal PF 08/25/2018  . Pneumococcal Polysaccharide-23 01/05/2019  . Zoster Recombinat (Shingrix) 12/23/2017, 07/22/2018     Past Medical History:  Diagnosis Date  . Anemia yrs ago  . Aortic insufficiency   . Breast cancer of lower-outer quadrant of right female breast ([place]) 10/10/2015   left breat also chemo done for first  . Cough    from toporol  . Heart murmur   . Hypertension   . Osteoporosis   . Spinal headache yrs ago    Tobacco History: Social History   Tobacco Use  Smoking Status Never Smoker  Smokeless Tobacco Never Used   Counseling given: Yes  Continue to not smoke  Outpatient Encounter Medications as of 02/06/2019  Medication Sig  . Calcium Carb-Cholecalciferol (CALCIUM-VITAMIN D) 500-400 MG-UNIT TABS Take 1 tablet by mouth 2 (two) times daily.  . chlorpheniramine-HYDROcodone (TUSSIONEX PENNKINETIC ER) 10-8 MG/5ML SUER Take 5 mLs by mouth daily as needed for cough (1/4-1/2 tsp daily PRN cough).  . metoprolol succinate (TOPROL-XL) 25 MG 24 hr tablet Take 25 mg by mouth daily.  . metoprolol succinate (TOPROL-XL) 50 MG 24 hr tablet Take 50 mg by mouth daily.   [name] Kitchen OLMESARTAN MEDOXOMIL PO Take 40 mg by mouth every other day.   . tamoxifen (NOLVADEX) 20 MG tablet TAKE 1 TABLET BY MOUTH EVERY DAY  .  benzonatate (TESSALON) 200 MG capsule Take 1 capsule (200 mg total) by mouth 3 (three) times daily as needed for cough. (Patient not taking: Reported on 02/06/2019)   No facility-administered encounter medications on file as of 02/06/2019.      Review of Systems  Review of Systems  Constitutional: Negative for fatigue and fever.  HENT: Positive for congestion. Negative for sinus pressure and sinus pain.   Respiratory: Positive for cough  (dry cough ) and shortness of breath (with exertion ). Negative for wheezing.   Cardiovascular: Negative for chest pain and palpitations.  Gastrointestinal: Negative for diarrhea, nausea and vomiting.  Psychiatric/Behavioral: Negative for dysphoric mood. The patient is not nervous/anxious.      Physical Exam  BP 120/72 (BP Location: Right Arm, Cuff Size: Normal)   Pulse 60   Ht 4\' 8"  (1.422 m)   Wt 91 lb 9.6 oz (41.5 kg)   SpO2 93%   BMI 20.54 kg/m   Wt Readings from Last 5 Encounters:  02/06/19 91 lb 9.6 oz (41.5 kg)  01/08/19 92 lb (41.7 kg)  12/25/18 91 lb 3.2 oz (41.4 kg)  08/28/18 91 lb (41.3 kg)  05/08/18 91 lb 9.6 oz (41.5 kg)    Physical Exam  Constitutional: She is oriented to person, place, and time and well-developed, well-nourished, and in no distress. Vital signs are normal. No distress.  HENT:  Head: Normocephalic and atraumatic.  Right Ear: Hearing, tympanic membrane, external ear and ear canal normal.  Left Ear: Hearing, tympanic membrane, external ear and ear canal normal.  Nose: Mucosal edema and rhinorrhea present. Right sinus exhibits no maxillary sinus tenderness and no frontal sinus tenderness. Left sinus exhibits no maxillary sinus tenderness and no frontal sinus tenderness.  Mouth/Throat: Uvula is midline and oropharynx is clear and moist. No oropharyngeal exudate.  Eyes: Pupils are equal, round, and reactive to light.  Neck: Normal range of motion. Neck supple.  Cardiovascular: Normal rate, regular rhythm and normal heart sounds.  Pulmonary/Chest: Effort normal and breath sounds normal. No accessory muscle usage. No respiratory distress. She has no decreased breath sounds. She has no wheezes. She has no rhonchi.  Musculoskeletal: Normal range of motion.        General: No edema.  Lymphadenopathy:    She has no cervical adenopathy.  Neurological: She is alert and oriented to person, place, and time. Gait normal.  Skin: Skin is warm and dry. She is not  diaphoretic. No erythema.  Psychiatric: Mood, memory, affect and judgment normal.  Nursing note and vitals reviewed.     Lab Results:  CBC    Component Value Date/Time   WBC 6.6 05/08/2018 1420   WBC 6.8 05/01/2017 1341   WBC 7.3 12/07/2015 1052   RBC 3.96 05/08/2018 1420   HGB 12.4 05/08/2018 1420   HGB 12.5 05/01/2017 1341   HCT 36.8 05/08/2018 1420   HCT 37.0 05/01/2017 1341   PLT 215 05/08/2018 1420   PLT 242 05/01/2017 1341   MCV 93.0 05/08/2018 1420   MCV 91.9 05/01/2017 1341   MCH 31.2 05/08/2018 1420   MCHC 33.6 05/08/2018 1420   RDW 13.0 05/08/2018 1420   RDW 12.8 05/01/2017 1341   LYMPHSABS 1.5 05/08/2018 1420   LYMPHSABS 1.8 05/01/2017 1341   MONOABS 0.5 05/08/2018 1420   MONOABS 0.5 05/01/2017 1341   EOSABS 0.1 05/08/2018 1420   EOSABS 0.1 05/01/2017 1341   BASOSABS 0.1 05/08/2018 1420   BASOSABS 0.1 05/01/2017 1341    BMET  Component Value Date/Time   NA 136 09/19/2018 1025   NA 139 05/01/2017 1341   K 4.7 09/19/2018 1025   K 4.3 05/01/2017 1341   CL 94 (L) 09/19/2018 1025   CO2 27 09/19/2018 1025   CO2 30 (H) 05/01/2017 1341   GLUCOSE 90 09/19/2018 1025   GLUCOSE 104 05/08/2018 1420   GLUCOSE 101 05/01/2017 1341   BUN 14 09/19/2018 1025   BUN 15.6 05/01/2017 1341   CREATININE 0.87 09/19/2018 1025   CREATININE 0.80 05/08/2018 1420   CREATININE 1.0 05/01/2017 1341   CALCIUM 10.3 09/19/2018 1025   CALCIUM 9.3 05/01/2017 1341   GFRNONAA 64 09/19/2018 1025   GFRNONAA >60 05/08/2018 1420   GFRAA 73 09/19/2018 1025   GFRAA >60 05/08/2018 1420    BNP No results found for: BNP  ProBNP No results found for: PROBNP    Assessment & Plan:    Cough Assessment: Continued dry cough Patient believes that the antihistamine and the Tussionex does help with her cough Patient does not want to try daily antihistamines Patient is currently not using Flonase High-resolution CT does not show ILD, mild air trapping, potential MAI Lungs clear to  auscultation today  Plan: FeNO today >>> Result was 11 Continue Tussionex Can add Flonase 1 spray each nostril as needed for nasal congestion Continue Tessalon Perles as needed Follow-up with our office in 3 to 6 months or sooner if symptoms worsen   Interstitial pulmonary disease ([place]) Assessment: Recent CT chest shows no evidence of interstitial lung disease No crackles on exam Lungs clear to auscultation today  Plan: Reviewed results Do not believe additional follow-up is needed  RHINITIS Assessment: Rhinorrhea nasal mucosa on exam today Patient does not believe that second-generation antihistamines work well for her  Plan: Trial of fluticasone nasal spray Encourage patient to do nasal saline rinses Can continue Tussionex      Lauraine Rinne, NP 02/06/2019   This appointment was 30 min long with over 50% of the time in direct face-to-face patient care, assessment, plan of care, and follow-up.

## 2019-02-06 ENCOUNTER — Encounter: Payer: Self-pay | Admitting: Pulmonary Disease

## 2019-02-06 ENCOUNTER — Ambulatory Visit (INDEPENDENT_AMBULATORY_CARE_PROVIDER_SITE_OTHER): Payer: Medicare Other | Admitting: Pulmonary Disease

## 2019-02-06 VITALS — BP 120/72 | HR 60 | Ht <= 58 in | Wt 91.6 lb

## 2019-02-06 DIAGNOSIS — R05 Cough: Secondary | ICD-10-CM | POA: Diagnosis not present

## 2019-02-06 DIAGNOSIS — J310 Chronic rhinitis: Secondary | ICD-10-CM | POA: Diagnosis not present

## 2019-02-06 DIAGNOSIS — J849 Interstitial pulmonary disease, unspecified: Secondary | ICD-10-CM | POA: Diagnosis not present

## 2019-02-06 DIAGNOSIS — R059 Cough, unspecified: Secondary | ICD-10-CM

## 2019-02-06 LAB — NITRIC OXIDE: Nitric Oxide: 11

## 2019-02-06 NOTE — Assessment & Plan Note (Signed)
Assessment: Continued dry cough Patient believes that the antihistamine and the Tussionex does help with her cough Patient does not want to try daily antihistamines Patient is currently not using Flonase High-resolution CT does not show ILD, mild air trapping, potential MAI Lungs clear to auscultation today  Plan: FeNO today >>> Result was 11 Continue Tussionex Can add Flonase 1 spray each nostril as needed for nasal congestion Continue Tessalon Perles as needed Follow-up with our office in 3 to 6 months or sooner if symptoms worsen

## 2019-02-06 NOTE — Assessment & Plan Note (Signed)
Assessment: Rhinorrhea nasal mucosa on exam today Patient does not believe that second-generation antihistamines work well for her  Plan: Trial of fluticasone nasal spray Encourage patient to do nasal saline rinses Can continue Tussionex

## 2019-02-06 NOTE — Patient Instructions (Signed)
FeNO today >>>Result was 11: >>>This is normal  Continue Tussionex management for cough Can use Tessalon Perles for management of cough as well  You can start fluticasone nasal spray 1 spray each nostril daily as needed for nasal congestion and allergies  You could also benefit from nasal saline rinses for management of allergic rhinitis and nasal congestion  Follow-up with our office in 3 to 6 months with Dr. Valeta Harms, can present to our office sooner if symptoms worsen  It is flu season:   >>> Best ways to protect herself from the flu: Receive the yearly flu vaccine, practice good hand hygiene washing with soap and also using hand sanitizer when available, eat a nutritious meals, get adequate rest, hydrate appropriately   Please contact the office if your symptoms worsen or you have concerns that you are not improving.   Thank you for choosing [facility] for your healthcare, and for allowing [place] to partner with you on your healthcare journey. I am thankful to be able to provide care to you today.   Wyn Quaker FNP-C

## 2019-02-06 NOTE — Progress Notes (Signed)
Discussed results with patient in office.  Nothing further is needed at this time.  Sarah Guilliams FNP  

## 2019-02-06 NOTE — Assessment & Plan Note (Signed)
Assessment: Recent CT chest shows no evidence of interstitial lung disease No crackles on exam Lungs clear to auscultation today  Plan: Reviewed results Do not believe additional follow-up is needed

## 2019-02-11 NOTE — Progress Notes (Signed)
PCCM: Agree. Thanks for seeing her.  [facility] 02/11/2019 5:15 PM

## 2019-03-26 ENCOUNTER — Telehealth: Payer: Self-pay

## 2019-03-26 ENCOUNTER — Telehealth: Payer: Self-pay | Admitting: Cardiology

## 2019-03-26 NOTE — Telephone Encounter (Signed)
Called and left a detailed message for the patient about switching her upcoming appointment for 4/13 at 3:40 with Dr. Stanford Breed to a virtual visit meaning that she can do a video or telephone visit with Dr. Stanford Breed and that she can give our office a call back and someone will assist with the matter

## 2019-03-26 NOTE — Telephone Encounter (Signed)
Follow up:    Patient did not want this appt. Patient states she will call back later.

## 2019-03-30 ENCOUNTER — Ambulatory Visit: Payer: Medicare Other | Admitting: Cardiology

## 2019-04-15 ENCOUNTER — Telehealth: Payer: Self-pay | Admitting: *Deleted

## 2019-04-15 NOTE — Telephone Encounter (Signed)
Left message for patient to call and reschedule 03/30/19 telehealth visit with Dr. Stanford Breed

## 2019-05-20 ENCOUNTER — Other Ambulatory Visit: Payer: Self-pay

## 2019-05-20 DIAGNOSIS — C50511 Malignant neoplasm of lower-outer quadrant of right female breast: Secondary | ICD-10-CM

## 2019-05-21 ENCOUNTER — Other Ambulatory Visit: Payer: Self-pay

## 2019-05-21 ENCOUNTER — Inpatient Hospital Stay: Payer: Medicare Other | Admitting: Oncology

## 2019-05-21 ENCOUNTER — Inpatient Hospital Stay: Payer: Medicare Other | Attending: Oncology

## 2019-05-21 VITALS — BP 147/59 | HR 64 | Temp 98.1°F | Resp 18 | Ht <= 58 in | Wt 88.8 lb

## 2019-05-21 DIAGNOSIS — Z170 Estrogen receptor positive status [ER+]: Secondary | ICD-10-CM | POA: Diagnosis not present

## 2019-05-21 DIAGNOSIS — C50511 Malignant neoplasm of lower-outer quadrant of right female breast: Secondary | ICD-10-CM

## 2019-05-21 DIAGNOSIS — Z79810 Long term (current) use of selective estrogen receptor modulators (SERMs): Secondary | ICD-10-CM | POA: Insufficient documentation

## 2019-05-21 LAB — CBC WITH DIFFERENTIAL (CANCER CENTER ONLY)
Basophils Absolute: 0.1 10*3/uL (ref 0.0–0.1)
Basophils Relative: 1 %
Eosinophils Absolute: 0.1 10*3/uL (ref 0.0–0.5)
Eosinophils Relative: 1 %
HCT: 37 % (ref 36.0–46.0)
Hemoglobin: 12.6 g/dL (ref 12.0–15.0)
Lymphocytes Relative: 24 %
Lymphs Abs: 1.8 10*3/uL (ref 0.7–4.0)
MCH: 31.1 pg (ref 26.0–34.0)
MCHC: 34.1 g/dL (ref 30.0–36.0)
MCV: 91.4 fL (ref 80.0–100.0)
Monocytes Absolute: 0.6 10*3/uL (ref 0.1–1.0)
Monocytes Relative: 8 %
Neutro Abs: 5.2 10*3/uL (ref 1.7–7.7)
Neutrophils Relative %: 66 %
Platelet Count: 250 10*3/uL (ref 150–400)
RBC: 4.05 MIL/uL (ref 3.87–5.11)
RDW: 12.3 % (ref 11.5–15.5)
WBC Count: 7.8 10*3/uL (ref 4.0–10.5)
nRBC: 0 % (ref 0.0–0.2)

## 2019-05-21 LAB — CMP (CANCER CENTER ONLY)
ALT: 12 U/L (ref 0–44)
AST: 20 U/L (ref 15–41)
Albumin: 3.7 g/dL (ref 3.5–5.0)
Alkaline Phosphatase: 45 U/L (ref 38–126)
Anion gap: 8 (ref 5–15)
BUN: 16 mg/dL (ref 8–23)
CO2: 30 mmol/L (ref 22–32)
Calcium: 9.9 mg/dL (ref 8.9–10.3)
Chloride: 96 mmol/L — ABNORMAL LOW (ref 98–111)
Creatinine: 0.96 mg/dL (ref 0.44–1.00)
GFR, Est AFR Am: >60 mL/min (ref >60)
GFR, Estimated: 56 mL/min — ABNORMAL LOW (ref >60)
Glucose, Bld: 88 mg/dL (ref 70–99)
Potassium: 3.8 mmol/L (ref 3.5–5.1)
Sodium: 134 mmol/L — ABNORMAL LOW (ref 135–145)
Total Bilirubin: 0.5 mg/dL (ref 0.3–1.2)
Total Protein: 6.3 g/dL — ABNORMAL LOW (ref 6.5–8.1)

## 2019-05-21 NOTE — Progress Notes (Signed)
[place]  Telephone:([phone]) [phone] Fax:(336) [phone]     ID: ABRI VACCA DOB: 03-18-1936  MR#: [id number]  XLK#:[id number]  Patient Care Team: Sarah Ada, MD as PCP - General (Family Medicine) Sarah Klein, MD as Consulting Physician (General Surgery) Sarah Lewis, Sarah Dad, MD as Consulting Physician (Oncology) Sarah Breed Denice Bors, MD as Consulting Physician (Cardiology) Sarah Nash, DO as Consulting Physician (Pulmonary Disease) Sarah Guys, MD as Consulting Physician (Ophthalmology) OTHER MD: Sarah Pi MD, Sarah Kenner MD   CHIEF COMPLAINT: Estrogen receptor positive breast cancer  CURRENT TREATMENT: tamoxifen   INTERVAL HISTORY: Sarah Lewis returns today for a follow-up and treatment of her estrogen receptor positive breast cancer.   The patient continues on tamoxifen, which she tolerates well. She notes occasional hot flashes, but she hardly notices them. She denies vaginal wetness.  Recall she is status post bilateral mastectomies, with bilateral reconstructions.  She is pleased with the reconstruction on the left, but not the one on the right.  She has not noted any change over either side however.   REVIEW OF SYSTEMS: Sarah Lewis reports doing well overall. She notes her church is holding outside services, so she is looking to attend this weekend. She and her husband, who is 77 y.o., work together to take care of the house and yard. Since her last visit, she underwent chest CT on 01/23/2019 to evaluate for interstitial lung disease, which was negative.  The patient denies unusual headaches, visual changes, nausea, vomiting, stiff neck, dizziness, or gait imbalance. There has been no cough, phlegm production, or pleurisy, no chest pain or pressure, and no change in bowel or bladder habits. The patient denies fever, rash, bleeding, unexplained fatigue or unexplained weight loss. A detailed review of systems was otherwise entirely negative.   BREAST CANCER  HISTORY: From the original intake note:  Sarah Lewis tells me she found her left breast cancer in 1979 GERD "they were not doing mammograms then". She brought it to medical attention and Sarah Lewis did her left mastectomy and axillary lymph node dissection. She was then seen by Sarah Lewis for medical oncology and received chemotherapy for 18 months (almost certainly this was CMF given every 3 weeks).  More recently she again herself felt a bump, in the underside of her right breast this time. Since she was scheduled for mammography later the same week, she simply mentioned it at the time of mammography, so that on 10/03/2015 she underwent bilateral diagnostic mammography with tomosynthesis at [facility]. The breast density was category D. In the right breast at the 6:00 position there was an irregular nodular density which by ultrasonography the same day was lobulated and measured 6 mm.  On 10/06/2015 she underwent ultrasound-guided biopsy of the right breast mass in question, and this found ([id number] an invasive ductal carcinoma, grade 1 or 2, as well as in situ ductal carcinoma. The invasive tumor was estrogen receptor positive at 100%, progesterone receptor positive at 95%, both with strong staining intensity, with an MIB-1 of 5%, and no HER-2 amplification, the signals ratio being 1.10 and the number per cell 2.15.  The patient's subsequent history is as detailed below   PAST MEDICAL HISTORY: Past Medical History:  Diagnosis Date  . Anemia yrs ago  . Aortic insufficiency   . Breast cancer of lower-outer quadrant of right female breast ([place]) 10/10/2015   left breat also chemo done for first  . Cough    from toporol  . Heart murmur   .  Hypertension   . Osteoporosis   . Spinal headache yrs ago    PAST SURGICAL HISTORY: Past Surgical History:  Procedure Laterality Date  . ABDOMINAL HYSTERECTOMY     total  . APPENDECTOMY    . BREAST RECONSTRUCTION WITH PLACEMENT OF TISSUE  EXPANDER AND FLEX HD (ACELLULAR HYDRATED DERMIS) Right 12/13/2015   Procedure: RIGHT BREAST RECONSTRUCTION WITH PLACEMENT OF silicone gel implant and allograft tissue;  Surgeon: Crissie Reese, MD;  Location: [place];  Service: Plastics;  Laterality: Right;  . BREAST SURGERY    . CESAREAN SECTION     x 2  . COLONOSCOPY WITH PROPOFOL N/A 04/08/2017   Procedure: COLONOSCOPY WITH PROPOFOL;  Surgeon: Garlan Fair, MD;  Location[facility];  Service: Endoscopy;  Laterality: N/A;  . HERNIA REPAIR    . MASTECTOMY Left 1979  . TONSILLECTOMY    . TOTAL MASTECTOMY Right 12/13/2015   Procedure: RIGHT MASTECTOMY ;  Surgeon: Sarah Klein, MD;  Location: [place];  Service: General;  Laterality: Right;    FAMILY HISTORY Family History  Problem Relation Age of Onset  . Other Mother        malignant neoplasm  . Bone cancer Father        malignant neoplasm  . Other Sister        malignant neoplasm   the patient's father had metastatic prostate cancer but died at 49 from heart disease. The patient's mother died from a clot to her lung at the age of 73, 3 months after the patient's father died. Sarah Lewis had no brothers, one sister. There is no other history of breast or ovarian cancer in the family.    GYNECOLOGIC HISTORY:  No LMP recorded. Patient has had a hysterectomy.  menarche age 73, first live birth age 16. The patient is GX P2. She underwent hysterectomy with bilateral salpingo-oophorectomy in 1982. She did not take hormone replacement. She never took oral contraceptives.    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 Her husband Sarah Lewis is a retired Medical illustrator. Daughter Sarah Lewis lives in [place] where she works as a Print production planner. Son Sarah Lewis lives in [place] where he works for an IT consultant. The patient is a Tourist information centre manager.    ADVANCED DIRECTIVES: In place    HEALTH MAINTENANCE: Social History   Tobacco Use  . Smoking status:  Never Smoker  . Smokeless tobacco: Never Used  Substance Use Topics  . Alcohol use: No  . Drug use: No     Colonoscopy: 03/2017, Dr. Wynetta Emery  PAP:  Bone density: osteoporosis   Lipid panel:  No Known Allergies  Current Outpatient Medications  Medication Sig Dispense Refill  . benzonatate (TESSALON) 200 MG capsule Take 1 capsule (200 mg total) by mouth 3 (three) times daily as needed for cough. (Patient not taking: Reported on 02/06/2019) 30 capsule 1  . Calcium Carb-Cholecalciferol (CALCIUM-VITAMIN D) 500-400 MG-UNIT TABS Take 1 tablet by mouth 2 (two) times daily.    . chlorpheniramine-HYDROcodone (TUSSIONEX PENNKINETIC ER) 10-8 MG/5ML SUER Take 5 mLs by mouth daily as needed for cough (1/4-1/2 tsp daily PRN cough). 140 mL   . metoprolol succinate (TOPROL-XL) 25 MG 24 hr tablet Take 25 mg by mouth daily.    . metoprolol succinate (TOPROL-XL) 50 MG 24 hr tablet Take 50 mg by mouth daily.   0  . OLMESARTAN MEDOXOMIL PO Take 40 mg by mouth every other day.     . tamoxifen (NOLVADEX) 20  MG tablet TAKE 1 TABLET BY MOUTH EVERY DAY 90 tablet 12   No current facility-administered medications for this visit.     OBJECTIVE: older white woman in no acute distress  Vitals:   05/21/19 1519  BP: (!) 147/59  Pulse: 64  Resp: 18  Temp: 98.1 F (36.7 C)  SpO2: 95%     Body mass index is 19.91 kg/m.    ECOG FS:0 - Asymptomatic  Sclerae unicteric, pupils round and equal Wearing a mask No cervical or supraclavicular adenopathy Lungs no rales or rhonchi Heart regular rate and rhythm Abd soft, nontender, positive bowel sounds MSK no focal spinal tenderness, no upper extremity lymphedema Neuro: nonfocal, well oriented, appropriate affect Breasts: Status post bilateral mastectomies.  On the right the implant is slightly displaced laterally.  It is otherwise unremarkable.  On the left it is better positioned.  There is no evidence of chest wall recurrence.  Both axillae are benign.  LAB  RESULTS:  CMP     Component Value Date/Time   NA 134 (L) 05/21/2019 1445   NA 136 09/19/2018 1025   NA 139 05/01/2017 1341   K 3.8 05/21/2019 1445   K 4.3 05/01/2017 1341   CL 96 (L) 05/21/2019 1445   CO2 30 05/21/2019 1445   CO2 30 (H) 05/01/2017 1341   GLUCOSE 88 05/21/2019 1445   GLUCOSE 101 05/01/2017 1341   BUN 16 05/21/2019 1445   BUN 14 09/19/2018 1025   BUN 15.6 05/01/2017 1341   CREATININE 0.96 05/21/2019 1445   CREATININE 1.0 05/01/2017 1341   CALCIUM 9.9 05/21/2019 1445   CALCIUM 9.3 05/01/2017 1341   PROT 6.3 (L) 05/21/2019 1445   PROT 6.1 (L) 05/01/2017 1341   ALBUMIN 3.7 05/21/2019 1445   ALBUMIN 3.7 05/01/2017 1341   AST 20 05/21/2019 1445   AST 17 05/01/2017 1341   ALT 12 05/21/2019 1445   ALT 13 05/01/2017 1341   ALKPHOS 45 05/21/2019 1445   ALKPHOS 46 05/01/2017 1341   BILITOT 0.5 05/21/2019 1445   BILITOT 0.36 05/01/2017 1341   GFRNONAA 56 (L) 05/21/2019 1445   GFRAA >60 05/21/2019 1445    INo results found for: SPEP, UPEP  Lab Results  Component Value Date   WBC 7.8 05/21/2019   NEUTROABS 5.2 05/21/2019   HGB 12.6 05/21/2019   HCT 37.0 05/21/2019   MCV 91.4 05/21/2019   PLT 250 05/21/2019      Chemistry      Component Value Date/Time   NA 134 (L) 05/21/2019 1445   NA 136 09/19/2018 1025   NA 139 05/01/2017 1341   K 3.8 05/21/2019 1445   K 4.3 05/01/2017 1341   CL 96 (L) 05/21/2019 1445   CO2 30 05/21/2019 1445   CO2 30 (H) 05/01/2017 1341   BUN 16 05/21/2019 1445   BUN 14 09/19/2018 1025   BUN 15.6 05/01/2017 1341   CREATININE 0.96 05/21/2019 1445   CREATININE 1.0 05/01/2017 1341      Component Value Date/Time   CALCIUM 9.9 05/21/2019 1445   CALCIUM 9.3 05/01/2017 1341   ALKPHOS 45 05/21/2019 1445   ALKPHOS 46 05/01/2017 1341   AST 20 05/21/2019 1445   AST 17 05/01/2017 1341   ALT 12 05/21/2019 1445   ALT 13 05/01/2017 1341   BILITOT 0.5 05/21/2019 1445   BILITOT 0.36 05/01/2017 1341       No results found for:  LABCA2  No components found for: [id number]  No results for input(s):  INR in the last 168 hours.  Urinalysis No results found for: COLORURINE, APPEARANCEUR, LABSPEC, PHURINE, GLUCOSEU, HGBUR, BILIRUBINUR, KETONESUR, PROTEINUR, UROBILINOGEN, NITRITE, LEUKOCYTESUR  STUDIES: No results found.  ASSESSMENT: 83 y.o. Sarah Lewis woman  (1) status post left mastectomy and axillary lymph node dissection with silicone implant reconstruction in 1979 for lymph node positive breast cancer treated adjuvantly with chemotherapy (likely CMF) for 18 months  (2) status post right breast biopsy 10/06/2015 for a clinical T1b N0, stage IA invasive ductal carcinoma, grade 1 or 2, with mucinous features; estrogen and progesterone receptor positive  (3) status post right mastectomy 12/13/2015 for a Right invasive ductal carcinoma, grade 1, repeat HER-2 again negative. Margins were ample  (4) adjuvant radiation not indicated  (5) tamoxifen started in early January 2017   PLAN: Genola is now 3-1/2 years out from definitive surgery for her breast cancer with no evidence of disease recurrence.  This is very favorable.  She is still considering whether she would like a little bit of realignment on the right reconstructed breast but at this point she does not want a referral  When she sees me again next year she will be close enough to the 5-year mark on tamoxifen that she can "graduate" at that time.  She is pleased with that prospect  She knows to call for any other issue that may develop before the next visit  Maximillian Habibi, Sarah Dad, MD  05/21/19 4:50 PM Medical Oncology and Hematology [facility] [place], [place] [address] Tel. [phone]    Fax. [phone]   I, Wilburn Mylar, am acting as scribe for Dr. Virgie Lewis. Cass Vandermeulen.  I, Lurline Del MD, have reviewed the above documentation for accuracy and completeness, and I agree with the above.

## 2019-05-22 ENCOUNTER — Telehealth: Payer: Self-pay | Admitting: Oncology

## 2019-05-22 NOTE — Telephone Encounter (Signed)
I left a message regarding schedule  

## 2019-07-23 ENCOUNTER — Ambulatory Visit: Payer: Medicare Other | Admitting: Cardiology

## 2019-08-13 ENCOUNTER — Other Ambulatory Visit: Payer: Self-pay | Admitting: Cardiology

## 2019-09-24 ENCOUNTER — Other Ambulatory Visit: Payer: Self-pay | Admitting: Oncology

## 2019-10-15 ENCOUNTER — Encounter

## 2019-10-16 ENCOUNTER — Ambulatory Visit: Payer: Medicare Other | Admitting: Cardiology

## 2019-12-24 NOTE — Progress Notes (Signed)
HPI: FU hypertension.  Echocardiogram October 2019 showed normal LV systolic function, grade 1 diastolic dysfunction, mild aortic insufficiency, moderate left atrial enlargement, mild right ventricular enlargement, moderate tricuspid regurgitation.  Chest CT February 2020 showed coronary calcification.  Since last seen there is no dyspnea, chest pain, palpitations or syncope.  She did feel somewhat fatigued on ARV's and diuretics previously and those were discontinued.  She brought records today and her blood pressure is running mildly high.  Current Outpatient Medications  Medication Sig Dispense Refill  . Calcium Carb-Cholecalciferol (CALCIUM-VITAMIN D) 500-400 MG-UNIT TABS Take 1 tablet by mouth 2 (two) times daily.    . Cholecalciferol (VITAMIN D-3) 25 MCG (1000 UT) CAPS Take 1 capsule by mouth daily.    . metoprolol succinate (TOPROL-XL) 100 MG 24 hr tablet Take 100 mg by mouth daily. Take with or immediately following a meal.    . tamoxifen (NOLVADEX) 20 MG tablet TAKE 1 TABLET BY MOUTH EVERY DAY 90 tablet 1   No current facility-administered medications for this visit.     Past Medical History:  Diagnosis Date  . Anemia yrs ago  . Aortic insufficiency   . Breast cancer of lower-outer quadrant of right female breast ([place]) 10/10/2015   left breat also chemo done for first  . Cough    from toporol  . Heart murmur   . Hypertension   . Osteoporosis   . Spinal headache yrs ago    Past Surgical History:  Procedure Laterality Date  . ABDOMINAL HYSTERECTOMY     total  . APPENDECTOMY    . BREAST RECONSTRUCTION WITH PLACEMENT OF TISSUE EXPANDER AND FLEX HD (ACELLULAR HYDRATED DERMIS) Right 12/13/2015   Procedure: RIGHT BREAST RECONSTRUCTION WITH PLACEMENT OF silicone gel implant and allograft tissue;  Surgeon: Crissie Reese, MD;  Location: [facility];  Service: Plastics;  Laterality: Right;  . BREAST SURGERY    . CESAREAN SECTION     x 2  . COLONOSCOPY WITH PROPOFOL N/A 04/08/2017    Procedure: COLONOSCOPY WITH PROPOFOL;  Surgeon: Garlan Fair, MD;  [facility];  Service: Endoscopy;  Laterality: N/A;  . HERNIA REPAIR    . MASTECTOMY Left 1979  . TONSILLECTOMY    . TOTAL MASTECTOMY Right 12/13/2015   Procedure: RIGHT MASTECTOMY ;  Surgeon: Stark Klein, MD;  Location: [facility];  Service: General;  Laterality: Right;    Social History   Socioeconomic History  . Marital status: Married    Spouse name: Not on file  . Number of children: 2  . Years of education: Not on file  . Highest education level: Not on file  Occupational History  . Not on file  Tobacco Use  . Smoking status: Never Smoker  . Smokeless tobacco: Never Used  Substance and Sexual Activity  . Alcohol use: No  . Drug use: No  . Sexual activity: Not on file  Other Topics Concern  . Not on file  Social History Narrative  . Not on file   Social Determinants of Health   Financial Resource Strain:   . Difficulty of Paying Living Expenses: Not on file  Food Insecurity:   . Worried About Charity fundraiser in the Last Year: Not on file  . Ran Out of Food in the Last Year: Not on file  Transportation Needs:   . Lack of Transportation (Medical): Not on file  . Lack of Transportation (Non-Medical): Not on file  Physical Activity:   . Days  of Exercise per Week: Not on file  . Minutes of Exercise per Session: Not on file  Stress:   . Feeling of Stress : Not on file  Social Connections:   . Frequency of Communication with Friends and Family: Not on file  . Frequency of Social Gatherings with Friends and Family: Not on file  . Attends Religious Services: Not on file  . Active Member of Clubs or Organizations: Not on file  . Attends Archivist Meetings: Not on file  . Marital Status: Not on file  Intimate Partner Violence:   . Fear of Current or Ex-Partner: Not on file  . Emotionally Abused: Not on file  . Physically Abused: Not on file  . Sexually Abused: Not on file     Family History  Problem Relation Age of Onset  . Other Mother        malignant neoplasm  . Bone cancer Father        malignant neoplasm  . Other Sister        malignant neoplasm    ROS: no fevers or chills, productive cough, hemoptysis, dysphasia, odynophagia, melena, hematochezia, dysuria, hematuria, rash, seizure activity, orthopnea, PND, pedal edema, claudication. Remaining systems are negative.  Physical Exam: Well-developed well-nourished in no acute distress.  Skin is warm and dry.  HEENT is normal.  Neck is supple.  Chest is clear to auscultation with normal expansion.  Cardiovascular exam is regular rate and rhythm.  2/6 diastolic murmur left sternal border. Abdominal exam nontender or distended. No masses palpated. Extremities show no edema. neuro grossly intact  ECG-sinus rhythm at a rate of 62, left anterior fascicular block, no ST changes.  Personally reviewed  A/P  1 hypertension-patient's blood pressure is elevated; ARBs and hydrochlorothiazide caused some fatigue previously.  We will try low-dose amlodipine at 2.5 mg daily.  Increase as needed.  2 aortic insufficiency-mild on most recent echocardiogram.  Kirk Ruths, MD

## 2019-12-28 ENCOUNTER — Other Ambulatory Visit: Payer: Self-pay

## 2019-12-28 ENCOUNTER — Encounter (INDEPENDENT_AMBULATORY_CARE_PROVIDER_SITE_OTHER): Payer: Self-pay

## 2019-12-28 ENCOUNTER — Ambulatory Visit (INDEPENDENT_AMBULATORY_CARE_PROVIDER_SITE_OTHER): Payer: Medicare PPO | Admitting: Cardiology

## 2019-12-28 ENCOUNTER — Encounter: Payer: Self-pay | Admitting: Cardiology

## 2019-12-28 VITALS — BP 152/80 | HR 62 | Ht 60.0 in | Wt 90.0 lb

## 2019-12-28 DIAGNOSIS — I351 Nonrheumatic aortic (valve) insufficiency: Secondary | ICD-10-CM

## 2019-12-28 DIAGNOSIS — I10 Essential (primary) hypertension: Secondary | ICD-10-CM

## 2019-12-28 MED ORDER — AMLODIPINE BESYLATE 2.5 MG PO TABS: 2.5000 mg | ORAL_TABLET | Freq: Every day | ORAL | 3 refills | Status: DC

## 2019-12-28 NOTE — Patient Instructions (Signed)
Medication Instructions:  START AMLODIPINE 2.5 MG ONCE DAILY  *If you need a refill on your cardiac medications before your next appointment, please call your pharmacy*  Lab Work: If you have labs (blood work) drawn today and your tests are completely normal, you will receive your results only by: [name] Kitchen MyChart Message (if you have MyChart) OR . A paper copy in the mail If you have any lab test that is abnormal or we need to change your treatment, we will call you to review the results.  Follow-Up: At [facility], you and your health needs are our priority.  As part of our continuing mission to provide you with exceptional heart care, we have created designated Provider Care Teams.  These Care Teams include your primary Cardiologist (physician) and Advanced Practice Providers (APPs -  Physician Assistants and Nurse Practitioners) who all work together to provide you with the care you need, when you need it.  Your next appointment:   6 month(s)  The format for your next appointment:   Either In Person or Virtual  Provider:   You may see Kirk Ruths MD or one of the following Advanced Practice Providers on your designated Care Team:    Kerin Ransom, PA-C  [place], [place]  Coletta Memos, [place]

## 2020-01-12 ENCOUNTER — Ambulatory Visit: Payer: Medicare PPO

## 2020-01-21 ENCOUNTER — Ambulatory Visit: Payer: Medicare PPO

## 2020-01-21 ENCOUNTER — Ambulatory Visit: Payer: Medicare PPO | Attending: Internal Medicine

## 2020-01-21 DIAGNOSIS — Z23 Encounter for immunization: Secondary | ICD-10-CM | POA: Insufficient documentation

## 2020-01-21 NOTE — Progress Notes (Signed)
   Covid-19 Vaccination Clinic  Name:  DEMPSEY FEIDER    MRN: [id number] DOB: Nov 22, 1936  01/21/2020  Ms. Rodriquez was observed post Covid-19 immunization for 15 minutes without incidence. She was provided with Vaccine Information Sheet and instruction to access the V-Safe system.   Ms. Wallman was instructed to call 911 with any severe reactions post vaccine: [name] Kitchen Difficulty breathing  . Swelling of your face and throat  . A fast heartbeat  . A bad rash all over your body  . Dizziness and weakness    Immunizations Administered    Name Date Dose VIS Date Route   Pfizer COVID-19 Vaccine 01/21/2020  1:52 PM 0.3 mL 11/27/2019 Intramuscular   Manufacturer: [place]   Lot: [id number]   [place]: [id number]

## 2020-01-29 ENCOUNTER — Ambulatory Visit: Payer: Medicare PPO

## 2020-02-15 ENCOUNTER — Ambulatory Visit: Payer: Medicare PPO | Attending: Internal Medicine

## 2020-02-15 DIAGNOSIS — Z23 Encounter for immunization: Secondary | ICD-10-CM

## 2020-02-15 NOTE — Progress Notes (Signed)
   Covid-19 Vaccination Clinic  Name:  Sarah Lewis    MRN: [id number] DOB: May 02, 1936  02/15/2020  Sarah Lewis was observed post Covid-19 immunization for 15 minutes without incidence. She was provided with Vaccine Information Sheet and instruction to access the V-Safe system.   Sarah Lewis was instructed to call 911 with any severe reactions post vaccine: [name] Kitchen Difficulty breathing  . Swelling of your face and throat  . A fast heartbeat  . A bad rash all over your body  . Dizziness and weakness    Immunizations Administered    Name Date Dose VIS Date Route   Pfizer COVID-19 Vaccine 02/15/2020 12:48 PM 0.3 mL 11/27/2019 Intramuscular   Manufacturer: [place]   Lot: [id number]   [place]: [id number]

## 2020-03-25 ENCOUNTER — Other Ambulatory Visit: Payer: Self-pay | Admitting: Oncology

## 2020-05-12 DIAGNOSIS — M810 Age-related osteoporosis without current pathological fracture: Secondary | ICD-10-CM | POA: Diagnosis not present

## 2020-07-19 ENCOUNTER — Other Ambulatory Visit: Payer: Self-pay

## 2020-07-19 DIAGNOSIS — C50511 Malignant neoplasm of lower-outer quadrant of right female breast: Secondary | ICD-10-CM

## 2020-07-19 NOTE — Progress Notes (Signed)
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   The patient continues on tamoxifen, which she tolerates well. She notes occasional hot flashes, but she hardly notices them. She denies vaginal wetness.  She just ran out of medications and is hoping that she does not need to restart   REVIEW OF SYSTEMS: Sarah Lewis protected herself well during the pandemic year and has received both Pfizer vaccine doses.  She is now a little bit more open, eating out with friends and spending time with the family at the beach.  She exercises regularly by doing housework cooking insulin but also going to the gym at least once a week.  Detailed review of systems today was otherwise stable   BREAST CANCER HISTORY: From the original intake note:  Sarah [place] tells me she found her left breast cancer in 1979 GERD "they were not doing mammograms then". She brought it to medical attention and Dr. Loralee Pacas did her left mastectomy and axillary lymph node dissection. She was then seen by Dr. Nicholes Stairs for medical oncology and received chemotherapy for 18 months (almost certainly this was CMF given every 3 weeks).  More recently she again herself felt a bump, in the  underside of her right breast this time. Since she was scheduled for mammography later the same week, she simply mentioned it at the time of mammography, so that on 10/03/2015 she underwent bilateral diagnostic mammography with tomosynthesis at [facility]. The breast density was category D. In the right breast at the 6:00 position there was an irregular nodular density which by ultrasonography the same day was lobulated and measured 6 mm.  On 10/06/2015 she underwent ultrasound-guided biopsy of the right breast mass in question, and this found ([id number]) an invasive ductal carcinoma, grade 1 or 2, as well as in situ ductal carcinoma. The invasive tumor was estrogen receptor positive at 100%, progesterone receptor positive at 95%, both with strong staining intensity, with an MIB-1 of 5%, and no HER-2 amplification, the signals ratio being 1.10 and the number per cell 2.15.  The patient's subsequent history is as detailed below   PAST MEDICAL HISTORY: Past Medical History:  Diagnosis Date  . Anemia yrs ago  . Aortic insufficiency   . Breast cancer of lower-outer quadrant of right female breast ([place]) 10/10/2015   left breat also chemo done for first  . Cough    from toporol  . Heart murmur   . Hypertension   . Osteoporosis   . Spinal headache yrs ago    PAST SURGICAL HISTORY: Past Surgical History:  Procedure Laterality Date  . ABDOMINAL HYSTERECTOMY     total  . APPENDECTOMY    . BREAST RECONSTRUCTION WITH PLACEMENT OF TISSUE EXPANDER AND FLEX HD (ACELLULAR HYDRATED DERMIS) Right 12/13/2015   Procedure: RIGHT BREAST RECONSTRUCTION WITH PLACEMENT OF silicone gel implant and allograft tissue;  Surgeon: Crissie Reese, MD;  Location: [facility];  Service: Plastics;  Laterality: Right;  . BREAST SURGERY    . CESAREAN SECTION     x 2  . COLONOSCOPY WITH PROPOFOL N/A 04/08/2017   Procedure: COLONOSCOPY WITH PROPOFOL;  Surgeon: Garlan Fair, MD;  Location: [facility];  Service: Endoscopy;   Laterality: N/A;  . HERNIA REPAIR    . MASTECTOMY Left 1979  . TONSILLECTOMY    . TOTAL MASTECTOMY Right 12/13/2015   Procedure: RIGHT MASTECTOMY ;  Surgeon: Stark Klein, MD;  Location: [facility];  Service: General;  Laterality: Right;    FAMILY HISTORY Family History  Problem Relation Age of Onset  . Other Mother        malignant neoplasm  . Bone cancer Father        malignant neoplasm  . Other Sister        malignant neoplasm   the patient's father had metastatic prostate cancer but died at 66 from heart disease. The patient's mother died from a clot to her lung at the age of 77, 3 months after the patient's father died. Sarah [place] had no brothers, one sister. There is no other history of breast or ovarian cancer in the family.    GYNECOLOGIC HISTORY:  No LMP recorded. Patient has had a hysterectomy.  menarche age 62, first live birth age 44. The patient is GX P2. She underwent hysterectomy with bilateral salpingo-oophorectomy in 1982. She did not take hormone replacement. She never took oral contraceptives.    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 Her husband Sarah [place] is a retired Medical illustrator. Daughter Sarah [place] lives in [place] where she works as a Print production planner. Son Sarah Lewis lives in [place] where he works for an IT consultant. The patient is a Tourist information centre manager.    ADVANCED DIRECTIVES: In place    HEALTH MAINTENANCE: Social History   Tobacco Use  . Smoking status: Never Smoker  . Smokeless tobacco: Never Used  Vaping Use  . Vaping Use: Never used  Substance Use Topics  . Alcohol use: No  . Drug use: No     Colonoscopy: 03/2017, Dr. Wynetta Emery  PAP:  Bone density: osteoporosis   Lipid panel:  No Known Allergies  Current Outpatient Medications  Medication Sig Dispense Refill  . amLODipine (NORVASC) 2.5 MG tablet Take 1 tablet (2.5 mg total) by mouth daily. 90 tablet 3  . Calcium Carb-Cholecalciferol (CALCIUM-VITAMIN  D) 500-400 MG-UNIT TABS Take 1 tablet by mouth 2 (two) times daily.    . Cholecalciferol (VITAMIN D-3) 25 MCG (1000 UT) CAPS Take 1 capsule by mouth daily.    . metoprolol succinate (TOPROL-XL) 100 MG 24 hr tablet Take 100 mg by mouth daily. Take with or immediately following a meal.    . tamoxifen (NOLVADEX) 20 MG tablet TAKE 1 TABLET BY MOUTH EVERY DAY 90 tablet 1   No current facility-administered medications for this visit.    OBJECTIVE:  white woman who appears well  Vitals:   07/20/20 0841  BP: (!) 167/73  Pulse: (!) 55  Resp: 18  Temp: 98.3 F (36.8 C)  SpO2: 95%     Body mass index is 16.55 kg/m.    ECOG FS:0 - Asymptomatic  Blood pressure was slightly elevated today as the patient was "excited" by her account; but she tells me at home her blood pressure has been very well controlled on her current medications.  Sclerae unicteric, EOMs intact Wearing  a mask No cervical or supraclavicular adenopathy Lungs no rales or rhonchi Heart regular rate and rhythm Abd soft, nontender, positive bowel sounds MSK no focal spinal tenderness, no upper extremity lymphedema Neuro: nonfocal, well oriented, appropriate affect Breasts: Status post bilateral mastectomy with bilateral implant reconstructions.  There is no evidence of local recurrence.  Both axillae are benign.   LAB RESULTS:  CMP     Component Value Date/Time   NA 134 (L) 05/21/2019 1445   NA 136 09/19/2018 1025   NA 139 05/01/2017 1341   K 3.8 05/21/2019 1445   K 4.3 05/01/2017 1341   CL 96 (L) 05/21/2019 1445   CO2 30 05/21/2019 1445   CO2 30 (H) 05/01/2017 1341   GLUCOSE 88 05/21/2019 1445   GLUCOSE 101 05/01/2017 1341   BUN 16 05/21/2019 1445   BUN 14 09/19/2018 1025   BUN 15.6 05/01/2017 1341   CREATININE 0.96 05/21/2019 1445   CREATININE 1.0 05/01/2017 1341   CALCIUM 9.9 05/21/2019 1445   CALCIUM 9.3 05/01/2017 1341   PROT 6.3 (L) 05/21/2019 1445   PROT 6.1 (L) 05/01/2017 1341   ALBUMIN 3.7 05/21/2019  1445   ALBUMIN 3.7 05/01/2017 1341   AST 20 05/21/2019 1445   AST 17 05/01/2017 1341   ALT 12 05/21/2019 1445   ALT 13 05/01/2017 1341   ALKPHOS 45 05/21/2019 1445   ALKPHOS 46 05/01/2017 1341   BILITOT 0.5 05/21/2019 1445   BILITOT 0.36 05/01/2017 1341   GFRNONAA 56 (L) 05/21/2019 1445   GFRAA >60 05/21/2019 1445    INo results found for: SPEP, UPEP  Lab Results  Component Value Date   WBC 8.2 07/20/2020   NEUTROABS 6.0 07/20/2020   HGB 14.5 07/20/2020   HCT 43.3 07/20/2020   MCV 94.1 07/20/2020   PLT 285 07/20/2020      Chemistry      Component Value Date/Time   NA 134 (L) 05/21/2019 1445   NA 136 09/19/2018 1025   NA 139 05/01/2017 1341   K 3.8 05/21/2019 1445   K 4.3 05/01/2017 1341   CL 96 (L) 05/21/2019 1445   CO2 30 05/21/2019 1445   CO2 30 (H) 05/01/2017 1341   BUN 16 05/21/2019 1445   BUN 14 09/19/2018 1025   BUN 15.6 05/01/2017 1341   CREATININE 0.96 05/21/2019 1445   CREATININE 1.0 05/01/2017 1341      Component Value Date/Time   CALCIUM 9.9 05/21/2019 1445   CALCIUM 9.3 05/01/2017 1341   ALKPHOS 45 05/21/2019 1445   ALKPHOS 46 05/01/2017 1341   AST 20 05/21/2019 1445   AST 17 05/01/2017 1341   ALT 12 05/21/2019 1445   ALT 13 05/01/2017 1341   BILITOT 0.5 05/21/2019 1445   BILITOT 0.36 05/01/2017 1341       No results found for: LABCA2  No components found for: LABCA125  No results for input(s): INR in the last 168 hours.  Urinalysis No results found for: COLORURINE, APPEARANCEUR, LABSPEC, PHURINE, GLUCOSEU, HGBUR, BILIRUBINUR, KETONESUR, PROTEINUR, UROBILINOGEN, NITRITE, LEUKOCYTESUR   STUDIES: No results found.   ASSESSMENT: 84 y.o.  woman  (1) status post left mastectomy and axillary lymph node dissection with silicone implant reconstruction in 1979 for lymph node positive breast cancer treated adjuvantly with chemotherapy (likely CMF) for 18 months  (2) status post right breast biopsy 10/06/2015 for a clinical T1b  N0, stage IA invasive ductal carcinoma, grade 1 or 2, with mucinous features; estrogen and progesterone receptor positive  (3) status  post right mastectomy 12/13/2015 for a Right invasive ductal carcinoma, grade 1, repeat HER-2 again negative. Margins were ample  (4) adjuvant radiation not indicated  (5) tamoxifen started in early January 2017, completing treatment august 2021   PLAN: Sarah [place] is a progressing 5 years out from her definitive surgery for breast cancer with no evidence of disease recurrence.  This is very favorable.  She has completed just about 5 years of tamoxifen and I am comfortable with her "graduating" at this point.  She has had no significant side effects from that medication so I do not expect any symptomatic changes as she goes off treatment today.  At this point I am comfortable releasing her to her primary care physician's care.  All she will need is a yearly chest wall exam.  I have not made a return appointment for Sarah [place] but I will be glad to see her any point in the future if and when the need arises.  Total encounter time 20 minutes.*  Tobin Witucki, Virgie Dad, MD  07/20/20 9:06 AM Medical Oncology and Hematology [facility] [place], [place] [address] Tel. [phone]    Fax. [phone]   I, Wilburn Mylar, am acting as scribe for Dr. Virgie Dad. Edelmira Gallogly.  I, Lurline Del MD, have reviewed the above documentation for accuracy and completeness, and I agree with the above.   *Total Encounter Time as defined by the Centers for Medicare and Medicaid Services includes, in addition to the face-to-face time of a patient visit (documented in the note above) non-face-to-face time: obtaining and reviewing outside history, ordering and reviewing medications, tests or procedures, care coordination (communications with other health care professionals or caregivers) and documentation in the medical record.

## 2020-07-20 ENCOUNTER — Encounter: Payer: Self-pay | Admitting: Oncology

## 2020-07-20 ENCOUNTER — Inpatient Hospital Stay: Payer: Medicare PPO

## 2020-07-20 ENCOUNTER — Inpatient Hospital Stay: Payer: Medicare PPO | Attending: Oncology | Admitting: Oncology

## 2020-07-20 ENCOUNTER — Other Ambulatory Visit: Payer: Self-pay

## 2020-07-20 VITALS — BP 167/73 | HR 55 | Temp 98.3°F | Resp 18 | Ht 61.0 in | Wt 87.6 lb

## 2020-07-20 DIAGNOSIS — C50511 Malignant neoplasm of lower-outer quadrant of right female breast: Secondary | ICD-10-CM

## 2020-07-20 DIAGNOSIS — M818 Other osteoporosis without current pathological fracture: Secondary | ICD-10-CM | POA: Insufficient documentation

## 2020-07-20 DIAGNOSIS — Z79810 Long term (current) use of selective estrogen receptor modulators (SERMs): Secondary | ICD-10-CM | POA: Insufficient documentation

## 2020-07-20 DIAGNOSIS — Z9221 Personal history of antineoplastic chemotherapy: Secondary | ICD-10-CM | POA: Diagnosis not present

## 2020-07-20 DIAGNOSIS — I10 Essential (primary) hypertension: Secondary | ICD-10-CM | POA: Diagnosis not present

## 2020-07-20 DIAGNOSIS — C50512 Malignant neoplasm of lower-outer quadrant of left female breast: Secondary | ICD-10-CM | POA: Diagnosis not present

## 2020-07-20 DIAGNOSIS — Z170 Estrogen receptor positive status [ER+]: Secondary | ICD-10-CM

## 2020-07-20 DIAGNOSIS — Z9013 Acquired absence of bilateral breasts and nipples: Secondary | ICD-10-CM | POA: Insufficient documentation

## 2020-07-20 LAB — CBC WITH DIFFERENTIAL (CANCER CENTER ONLY)
Abs Immature Granulocytes: 0.02 10*3/uL (ref 0.00–0.07)
Basophils Absolute: 0.1 10*3/uL (ref 0.0–0.1)
Basophils Relative: 1 %
Eosinophils Absolute: 0.2 10*3/uL (ref 0.0–0.5)
Eosinophils Relative: 2 %
HCT: 43.3 % (ref 36.0–46.0)
Hemoglobin: 14.5 g/dL (ref 12.0–15.0)
Immature Granulocytes: 0 %
Lymphocytes Relative: 17 %
Lymphs Abs: 1.4 10*3/uL (ref 0.7–4.0)
MCH: 31.5 pg (ref 26.0–34.0)
MCHC: 33.5 g/dL (ref 30.0–36.0)
MCV: 94.1 fL (ref 80.0–100.0)
Monocytes Absolute: 0.6 10*3/uL (ref 0.1–1.0)
Monocytes Relative: 7 %
Neutro Abs: 6 10*3/uL (ref 1.7–7.7)
Neutrophils Relative %: 73 %
Platelet Count: 285 10*3/uL (ref 150–400)
RBC: 4.6 MIL/uL (ref 3.87–5.11)
RDW: 12.9 % (ref 11.5–15.5)
WBC Count: 8.2 10*3/uL (ref 4.0–10.5)
nRBC: 0 % (ref 0.0–0.2)

## 2020-07-20 LAB — CMP (CANCER CENTER ONLY)
ALT: 27 U/L (ref 0–44)
AST: 28 U/L (ref 15–41)
Albumin: 3.8 g/dL (ref 3.5–5.0)
Alkaline Phosphatase: 57 U/L (ref 38–126)
Anion gap: 8 (ref 5–15)
BUN: 12 mg/dL (ref 8–23)
CO2: 28 mmol/L (ref 22–32)
Calcium: 9.8 mg/dL (ref 8.9–10.3)
Chloride: 103 mmol/L (ref 98–111)
Creatinine: 0.92 mg/dL (ref 0.44–1.00)
GFR, Est AFR Am: >60 mL/min (ref >60)
GFR, Estimated: 58 mL/min — ABNORMAL LOW (ref >60)
Glucose, Bld: 99 mg/dL (ref 70–99)
Potassium: 4 mmol/L (ref 3.5–5.1)
Sodium: 139 mmol/L (ref 135–145)
Total Bilirubin: 0.8 mg/dL (ref 0.3–1.2)
Total Protein: 6.7 g/dL (ref 6.5–8.1)

## 2020-07-21 ENCOUNTER — Telehealth: Payer: Self-pay | Admitting: Oncology

## 2020-07-21 NOTE — Telephone Encounter (Signed)
No 8/4 los, no changes made to pt schedule  

## 2020-08-01 DIAGNOSIS — R05 Cough: Secondary | ICD-10-CM | POA: Diagnosis not present

## 2020-08-01 DIAGNOSIS — R0602 Shortness of breath: Secondary | ICD-10-CM | POA: Diagnosis not present

## 2020-08-01 DIAGNOSIS — M810 Age-related osteoporosis without current pathological fracture: Secondary | ICD-10-CM | POA: Diagnosis not present

## 2020-08-01 DIAGNOSIS — I10 Essential (primary) hypertension: Secondary | ICD-10-CM | POA: Diagnosis not present

## 2020-08-01 DIAGNOSIS — R5383 Other fatigue: Secondary | ICD-10-CM | POA: Diagnosis not present

## 2020-08-08 ENCOUNTER — Telehealth: Payer: Self-pay | Admitting: Cardiology

## 2020-08-08 NOTE — Telephone Encounter (Signed)
Per pt feels fine but was told by PCP to f/u with cardiology due to elevated BNP of 213 Per pt all other testing was normal and pt feels okay has some SOB but not any different than any other time Will forward to Dr Stanford Breed for review next available is 11/2 may pt wait til then to be seen .Adonis Housekeeper

## 2020-08-08 NOTE — Telephone Encounter (Signed)
Pt c/o Shortness Of Breath: STAT if SOB developed within the last 24 hours or pt is noticeably SOB on the phone  1. Are you currently SOB (can you hear that pt is SOB on the phone)? no  2. How long have you been experiencing SOB? about a year  3. Are you SOB when sitting or when up moving around? Up and moving around  4. Are you currently experiencing any other symptoms? No  Patient says she gets SOB when she walks as little as halfway up the stairs or other short distances. She has to stop frequently when doing housework. She went to see her PCP today and her BNP was elevated. Her PCP wanted the patient to see her Cardiologist for evaluation. The patient only wants to see Dr. Stanford Breed and is reluctant to wait until 10/18/20. Please let the patient know what the office decides

## 2020-08-09 NOTE — Telephone Encounter (Signed)
Spoke with pt, Aware of dr Jacalyn Lefevre recommendations. Follow up scheduled per recall.

## 2020-08-09 NOTE — Telephone Encounter (Signed)
BNP elevation is mild; if no symptoms, would not change meds; fu as scheduled Kirk Ruths

## 2020-09-09 DIAGNOSIS — Z8249 Family history of ischemic heart disease and other diseases of the circulatory system: Secondary | ICD-10-CM | POA: Diagnosis not present

## 2020-09-09 DIAGNOSIS — M810 Age-related osteoporosis without current pathological fracture: Secondary | ICD-10-CM | POA: Diagnosis not present

## 2020-09-09 DIAGNOSIS — Z853 Personal history of malignant neoplasm of breast: Secondary | ICD-10-CM | POA: Diagnosis not present

## 2020-09-09 DIAGNOSIS — Z8589 Personal history of malignant neoplasm of other organs and systems: Secondary | ICD-10-CM | POA: Diagnosis not present

## 2020-09-09 DIAGNOSIS — Z79899 Other long term (current) drug therapy: Secondary | ICD-10-CM | POA: Diagnosis not present

## 2020-09-09 DIAGNOSIS — I10 Essential (primary) hypertension: Secondary | ICD-10-CM | POA: Diagnosis not present

## 2020-09-09 DIAGNOSIS — H269 Unspecified cataract: Secondary | ICD-10-CM | POA: Diagnosis not present

## 2020-09-09 DIAGNOSIS — Z803 Family history of malignant neoplasm of breast: Secondary | ICD-10-CM | POA: Diagnosis not present

## 2020-09-09 DIAGNOSIS — I739 Peripheral vascular disease, unspecified: Secondary | ICD-10-CM | POA: Diagnosis not present

## 2020-09-19 ENCOUNTER — Other Ambulatory Visit: Payer: Self-pay

## 2020-09-19 ENCOUNTER — Encounter: Payer: Self-pay | Admitting: Pulmonary Disease

## 2020-09-19 ENCOUNTER — Ambulatory Visit (INDEPENDENT_AMBULATORY_CARE_PROVIDER_SITE_OTHER): Payer: Medicare PPO | Admitting: Pulmonary Disease

## 2020-09-19 VITALS — BP 126/64 | HR 60 | Temp 97.3°F | Ht 61.0 in | Wt 89.2 lb

## 2020-09-19 DIAGNOSIS — J479 Bronchiectasis, uncomplicated: Secondary | ICD-10-CM | POA: Diagnosis not present

## 2020-09-19 DIAGNOSIS — R062 Wheezing: Secondary | ICD-10-CM

## 2020-09-19 DIAGNOSIS — R0600 Dyspnea, unspecified: Secondary | ICD-10-CM | POA: Diagnosis not present

## 2020-09-19 DIAGNOSIS — R0609 Other forms of dyspnea: Secondary | ICD-10-CM

## 2020-09-19 DIAGNOSIS — R059 Cough, unspecified: Secondary | ICD-10-CM | POA: Diagnosis not present

## 2020-09-19 MED ORDER — ALBUTEROL SULFATE (2.5 MG/3ML) 0.083% IN NEBU: 2.5000 mg | INHALATION_SOLUTION | Freq: Four times a day (QID) | RESPIRATORY_TRACT | 12 refills | Status: DC | PRN

## 2020-09-19 NOTE — Patient Instructions (Signed)
Thank you for visiting Dr. Valeta Harms at [facility]. Today we recommend the following:  Orders Placed This Encounter  Procedures  . Ambulatory Referral for DME   Meds ordered this encounter  Medications  . albuterol (PROVENTIL) (2.5 MG/3ML) 0.083% nebulizer solution    Sig: Take 3 mLs (2.5 mg total) by nebulization every 6 (six) hours as needed for wheezing or shortness of breath.    Dispense:  75 mL    Refill:  12   Return in about 1 year (around 09/19/2021), or if symptoms worsen or fail to improve.    Please do your part to reduce the spread of COVID-19.

## 2020-09-19 NOTE — Progress Notes (Signed)
Synopsis: Referred in Jan 2020 for SOB by Carol Ada, MD  Subjective:   PATIENT ID: Sarah Lewis GENDER: female DOB: [birth year], MRN: [id number]  Chief Complaint  Patient presents with  . Follow-up    shortness of breath with exertion    PMH of HTN, on medications, h/o breast cancer s/p BL mastectomy. SOB for the past 5-6 months, most notice when walking up steps or up the hill. She feels like she has to stop. Nothing has made it better or worse. She feels all right if she stops to catch her breath. Lives here in [place], [place]. 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 She does have a cough. Nothing comes up with it. Dry non-productive cough. She really had a bad cough when she was on lisinopril. Much better after coming off this. Life long non-smoker. No alcohol use. Married, 2 children, 5 grandkids.   OV 09/19/2020: Here today for follow-up.  Still having ongoing dyspnea on exertion on occasion.  Does have dry nonproductive cough.  She does have mild areas of scarring within the lung on her recent CT scan in 2020.  She had work-up to include pulmonary function test which did really show areas of restriction with impaired spirometry although be it mild.  She does have small areas of air trapping.  Review of the images myself do show some areas of bronchiectasis the tree-in-bud nodules which may in fact reflect MAI.  We discussed this today in the office.  Her productive cough is still nonproductive and her weight has been stable.  She is a little concerned about having blood clots.  But she has no risk factors for these no recent surgeries no trauma no swelling in the legs no chest pain.  She was worried because she had a friend recently diagnosed with these and is wondering if this could still be her problem.   Past Medical History:  Diagnosis Date  . Anemia yrs ago  . Aortic insufficiency   . Breast cancer of lower-outer quadrant of right female breast  ([place]) 10/10/2015   left breat also chemo done for first  . Cough    from toporol  . Heart murmur   . Hypertension   . Osteoporosis   . Spinal headache yrs ago     Family History  Problem Relation Age of Onset  . Other Mother        malignant neoplasm  . Bone cancer Father        malignant neoplasm  . Other Sister        malignant neoplasm     Past Surgical History:  Procedure Laterality Date  . ABDOMINAL HYSTERECTOMY     total  . APPENDECTOMY    . BREAST RECONSTRUCTION WITH PLACEMENT OF TISSUE EXPANDER AND FLEX HD (ACELLULAR HYDRATED DERMIS) Right 12/13/2015   Procedure: RIGHT BREAST RECONSTRUCTION WITH PLACEMENT OF silicone gel implant and allograft tissue;  Surgeon: Crissie Reese, MD;  Location: [facility];  Service: Plastics;  Laterality: Right;  . BREAST SURGERY    . CESAREAN SECTION     x 2  . COLONOSCOPY WITH PROPOFOL N/A 04/08/2017   Procedure: COLONOSCOPY WITH PROPOFOL;  Surgeon: Garlan Fair, MD;  Location: [facility];  Service: Endoscopy;  Laterality: N/A;  . HERNIA REPAIR    . MASTECTOMY Left 1979  . TONSILLECTOMY    . TOTAL MASTECTOMY Right 12/13/2015   Procedure: RIGHT MASTECTOMY ;  Surgeon: Stark Klein, MD;  Location: [facility];  Service: General;  Laterality: Right;    Social History   Socioeconomic History  . Marital status: Married    Spouse name: Not on file  . Number of children: 2  . Years of education: Not on file  . Highest education level: Not on file  Occupational History  . Not on file  Tobacco Use  . Smoking status: Never Smoker  . Smokeless tobacco: Never Used  Vaping Use  . Vaping Use: Never used  Substance and Sexual Activity  . Alcohol use: No  . Drug use: No  . Sexual activity: Not on file  Other Topics Concern  . Not on file  Social History Narrative  . Not on file   Social Determinants of Health   Financial Resource Strain:   . Difficulty of Paying Living Expenses: Not on file  Food Insecurity:   . Worried About Paediatric nurse in the Last Year: Not on file  . Ran Out of Food in the Last Year: Not on file  Transportation Needs:   . Lack of Transportation (Medical): Not on file  . Lack of Transportation (Non-Medical): Not on file  Physical Activity:   . Days of Exercise per Week: Not on file  . Minutes of Exercise per Session: Not on file  Stress:   . Feeling of Stress : Not on file  Social Connections:   . Frequency of Communication with Friends and Family: Not on file  . Frequency of Social Gatherings with Friends and Family: Not on file  . Attends Religious Services: Not on file  . Active Member of Clubs or Organizations: Not on file  . Attends Archivist Meetings: Not on file  . Marital Status: Not on file  Intimate Partner Violence:   . Fear of Current or Ex-Partner: Not on file  . Emotionally Abused: Not on file  . Physically Abused: Not on file  . Sexually Abused: Not on file     No Known Allergies   Outpatient Medications Prior to Visit  Medication Sig Dispense Refill  . amLODipine (NORVASC) 2.5 MG tablet Take 1 tablet (2.5 mg total) by mouth daily. 90 tablet 3  . Calcium Carb-Cholecalciferol (CALCIUM-VITAMIN D) 500-400 MG-UNIT TABS Take 1 tablet by mouth 2 (two) times daily.    . Cholecalciferol (VITAMIN D-3) 25 MCG (1000 UT) CAPS Take 1 capsule by mouth daily.    . metoprolol succinate (TOPROL-XL) 100 MG 24 hr tablet Take 100 mg by mouth daily. Take with or immediately following a meal.    . tamoxifen (NOLVADEX) 20 MG tablet TAKE 1 TABLET BY MOUTH EVERY DAY (Patient not taking: Reported on 09/19/2020) 90 tablet 1   No facility-administered medications prior to visit.    Review of Systems  Constitutional: Negative for chills, fever, malaise/fatigue and weight loss.  HENT: Negative for hearing loss, sore throat and tinnitus.   Eyes: Negative for blurred vision and double vision.  Respiratory: Positive for cough, shortness of breath and wheezing. Negative for hemoptysis,  sputum production and stridor.   Cardiovascular: Negative for chest pain, palpitations, orthopnea, leg swelling and PND.  Gastrointestinal: Negative for abdominal pain, constipation, diarrhea, heartburn, nausea and vomiting.  Genitourinary: Negative for dysuria, hematuria and urgency.  Musculoskeletal: Negative for joint pain and myalgias.  Skin: Negative for itching and rash.  Neurological: Negative for dizziness, tingling, weakness and headaches.  Endo/Heme/Allergies: Negative for environmental allergies. Does not bruise/bleed easily.  Psychiatric/Behavioral: Negative for depression. The patient is not nervous/anxious and does  not have insomnia.   All other systems reviewed and are negative.    Objective:  Physical Exam Vitals reviewed.  Constitutional:      General: She is not in acute distress.    Appearance: She is well-developed.  HENT:     Head: Normocephalic and atraumatic.  Eyes:     General: No scleral icterus.    Conjunctiva/sclera: Conjunctivae normal.     Pupils: Pupils are equal, round, and reactive to light.  Neck:     Vascular: No JVD.     Trachea: No tracheal deviation.  Cardiovascular:     Rate and Rhythm: Normal rate and regular rhythm.     Heart sounds: Normal heart sounds. No murmur heard.   Pulmonary:     Effort: Pulmonary effort is normal. No tachypnea, accessory muscle usage or respiratory distress.     Breath sounds: No stridor. Wheezing present. No rhonchi or rales.  Abdominal:     General: Bowel sounds are normal. There is no distension.     Palpations: Abdomen is soft.     Tenderness: There is no abdominal tenderness.  Musculoskeletal:        General: No tenderness.     Cervical back: Neck supple.     Comments: Thin muscle wasting  Lymphadenopathy:     Cervical: No cervical adenopathy.  Skin:    General: Skin is warm and dry.     Capillary Refill: Capillary refill takes less than 2 seconds.     Findings: No rash.  Neurological:     Mental  Status: She is alert and oriented to person, place, and time.  Psychiatric:        Behavior: Behavior normal.      Vitals:   09/19/20 1158  BP: 126/64  Pulse: 60  Temp: (!) 97.3 F (36.3 C)  TempSrc: Other (Comment)  SpO2: 95%  Weight: 89 lb 3.2 oz (40.5 kg)  Height: 5\' 1"  (1.549 m)   95% on RA BMI Readings from Last 3 Encounters:  09/19/20 16.85 kg/m  07/20/20 16.55 kg/m  12/28/19 17.58 kg/m   Wt Readings from Last 3 Encounters:  09/19/20 89 lb 3.2 oz (40.5 kg)  07/20/20 87 lb 9.6 oz (39.7 kg)  12/28/19 90 lb (40.8 kg)     CBC    Component Value Date/Time   WBC 8.2 07/20/2020 0843   WBC 6.8 05/01/2017 1341   WBC 7.3 12/07/2015 1052   RBC 4.60 07/20/2020 0843   HGB 14.5 07/20/2020 0843   HGB 12.5 05/01/2017 1341   HCT 43.3 07/20/2020 0843   HCT 37.0 05/01/2017 1341   PLT 285 07/20/2020 0843   PLT 242 05/01/2017 1341   MCV 94.1 07/20/2020 0843   MCV 91.9 05/01/2017 1341   MCH 31.5 07/20/2020 0843   MCHC 33.5 07/20/2020 0843   RDW 12.9 07/20/2020 0843   RDW 12.8 05/01/2017 1341   LYMPHSABS 1.4 07/20/2020 0843   LYMPHSABS 1.8 05/01/2017 1341   MONOABS 0.6 07/20/2020 0843   MONOABS 0.5 05/01/2017 1341   EOSABS 0.2 07/20/2020 0843   EOSABS 0.1 05/01/2017 1341   BASOSABS 0.1 07/20/2020 0843   BASOSABS 0.1 05/01/2017 1341    Chest Imaging: CXR - 2016  - no active process The patient's images have been independently reviewed by me.    01/23/2019 high-resolution CT chest: No fibrotic disease.  Small areas of clustered nodules within the right middle lobe right upper lobe.  Small areas of air trapping. The patient's images have been  independently reviewed by me.    Pulmonary Functions Testing Results: PFT Results Latest Ref Rng & Units 01/08/2019  FVC-Pre L 1.08  FVC-Predicted Pre % 53  FVC-Post L 1.10  FVC-Predicted Post % 54  Pre FEV1/FVC % % 76  Post FEV1/FCV % % 80  FEV1-Pre L 0.82  FEV1-Predicted Pre % 55  FEV1-Post L 0.88  TLC L 3.65  TLC %  Predicted % 84  RV % Predicted % 120    FeNO: None   Pathology: None   Echocardiogram:  09/2018 Study Conclusions  - Left ventricle: The cavity size was normal. Systolic function was   normal. The estimated ejection fraction was in the range of 60%   to 65%. Wall motion was normal; there were no regional wall   motion abnormalities. Doppler parameters are consistent with   abnormal left ventricular relaxation (grade 1 diastolic   dysfunction). Doppler parameters are consistent with high   ventricular filling pressure. - Aortic valve: Transvalvular velocity was within the normal range.   There was no stenosis. There was mild regurgitation. - Mitral valve: Transvalvular velocity was within the normal range.   There was no evidence for stenosis. There was no regurgitation. - Left atrium: The atrium was moderately dilated. - Right ventricle: The cavity size was mildly dilated. Wall   thickness was normal. Systolic function was normal. - Tricuspid valve: There was moderate regurgitation. - Pulmonary arteries: Systolic pressure was mildly increased. PA   peak pressure: 48 mm Hg (S). - Global longitudinal strain -19.4% (normal).  Heart Catheterization: None     Assessment & Plan:   Bronchiectasis without complication ([place]) - Plan: Ambulatory Referral for DME, albuterol (PROVENTIL) (2.5 MG/3ML) 0.083% nebulizer solution  Wheezing - Plan: Ambulatory Referral for DME, albuterol (PROVENTIL) (2.5 MG/3ML) 0.083% nebulizer solution  Cough  DOE (dyspnea on exertion)  Discussion:  This is a 84 year old female longstanding history of cough, initially thought to be related to ACE inhibitor but once stopping it never recovered from getting rid of her symptoms.  No significant GERD or postnasal drip or allergies.  She had pulmonary function test with did show impaired spirometry, likely mild restriction.  Some evidence of air trapping with an increased residual volume.  She did have mildly  elevated pulmonary artery pressures on previous echocardiogram which also could lead to her dyspnea on exertion.  On exam today she does have wheezing.  She has evidence of mild bronchiectasis associated with the scarring in the lower lobes.  This could be the cause of her wheezing or she may in fact have small airway disease.  No previous diagnosis of asthma.  Would recommend patient have a trial of albuterol.  She does not like using inhalers so we discussed the potential of using a nebulizer.  During the office visit today we spent direct time reviewing her pulmonary function tests that were completed as well as review of her high-resolution CT scan of the chest which were all completed face-to-face.  Started patient on as needed albuterol solution via nebulizer.  Patient to return to clinic as needed or within the next year.  She is can call us and let us know if her symptoms worsen.   Current Outpatient Medications:  .  amLODipine (NORVASC) 2.5 MG tablet, Take 1 tablet (2.5 mg total) by mouth daily., Disp: 90 tablet, Rfl: 3 .  Calcium Carb-Cholecalciferol (CALCIUM-VITAMIN D) 500-400 MG-UNIT TABS, Take 1 tablet by mouth 2 (two) times daily., Disp: , Rfl:  .  Cholecalciferol (  VITAMIN D-3) 25 MCG (1000 UT) CAPS, Take 1 capsule by mouth daily., Disp: , Rfl:  .  metoprolol succinate (TOPROL-XL) 100 MG 24 hr tablet, Take 100 mg by mouth daily. Take with or immediately following a meal., Disp: , Rfl:  .  albuterol (PROVENTIL) (2.5 MG/3ML) 0.083% nebulizer solution, Take 3 mLs (2.5 mg total) by nebulization every 6 (six) hours as needed for wheezing or shortness of breath., Disp: 75 mL, Rfl: 12  I spent 31 minutes dedicated to the care of this patient on the date of this encounter to include pre-visit review of records, face-to-face time with the patient discussing conditions above, post visit ordering of testing, clinical documentation with the electronic health record, making appropriate referrals  as documented, and communicating necessary findings to members of the patients care team.    Garner Nash, DO [facility] 09/19/2020 12:31 PM

## 2020-09-21 DIAGNOSIS — Z23 Encounter for immunization: Secondary | ICD-10-CM | POA: Diagnosis not present

## 2020-09-22 DIAGNOSIS — J479 Bronchiectasis, uncomplicated: Secondary | ICD-10-CM | POA: Diagnosis not present

## 2020-09-22 DIAGNOSIS — E559 Vitamin D deficiency, unspecified: Secondary | ICD-10-CM | POA: Diagnosis not present

## 2020-09-22 DIAGNOSIS — R0602 Shortness of breath: Secondary | ICD-10-CM | POA: Diagnosis not present

## 2020-09-22 DIAGNOSIS — I10 Essential (primary) hypertension: Secondary | ICD-10-CM | POA: Diagnosis not present

## 2020-09-22 DIAGNOSIS — M810 Age-related osteoporosis without current pathological fracture: Secondary | ICD-10-CM | POA: Diagnosis not present

## 2020-09-30 DIAGNOSIS — M810 Age-related osteoporosis without current pathological fracture: Secondary | ICD-10-CM | POA: Diagnosis not present

## 2020-10-12 NOTE — Progress Notes (Addendum)
HPI: FU hypertension. Echocardiogram October 2019 showed normal LV systolic function, grade 1 diastolic dysfunction, mild aortic insufficiency, moderate left atrial enlargement, mild right ventricular enlargement, moderate tricuspid regurgitation. Chest CT February 2020 showed coronary calcification. Patient felt mildly fatigued on ARB's and diuretics previously and they were discontinued.  Seen recently by primary care with increased dyspnea.  Laboratories August 2021 showed creatinine 0.88, BUN 11, potassium 4.5, BNP 213, hemoglobin 14.7.  Since last seen  she has had some dyspnea on exertion and cough.  She was recently seen by pulmonary and started on albuterol which has caused her heart to race at times.  However it has improved her dyspnea and cough.  She otherwise denies orthopnea, PND, pedal edema, exertional chest pain or syncope.  Current Outpatient Medications  Medication Sig Dispense Refill  . albuterol (PROVENTIL) (2.5 MG/3ML) 0.083% nebulizer solution Take 3 mLs (2.5 mg total) by nebulization every 6 (six) hours as needed for wheezing or shortness of breath. 75 mL 12  . Calcium Carb-Cholecalciferol (CALCIUM-VITAMIN D) 500-400 MG-UNIT TABS Take 1 tablet by mouth 2 (two) times daily.    . Cholecalciferol (VITAMIN D-3) 25 MCG (1000 UT) CAPS Take 1 capsule by mouth daily.    . metoprolol succinate (TOPROL-XL) 100 MG 24 hr tablet Take 100 mg by mouth daily. Take with or immediately following a meal.    . amLODipine (NORVASC) 2.5 MG tablet Take 1 tablet (2.5 mg total) by mouth daily. 90 tablet 3   No current facility-administered medications for this visit.     Past Medical History:  Diagnosis Date  . Anemia yrs ago  . Aortic insufficiency   . Breast cancer of lower-outer quadrant of right female breast ([place]) 10/10/2015   left breat also chemo done for first  . Cough    from toporol  . Heart murmur   . Hypertension   . Osteoporosis   . Spinal headache yrs ago    Past  Surgical History:  Procedure Laterality Date  . ABDOMINAL HYSTERECTOMY     total  . APPENDECTOMY    . BREAST RECONSTRUCTION WITH PLACEMENT OF TISSUE EXPANDER AND FLEX HD (ACELLULAR HYDRATED DERMIS) Right 12/13/2015   Procedure: RIGHT BREAST RECONSTRUCTION WITH PLACEMENT OF silicone gel implant and allograft tissue;  Surgeon: Crissie Reese, MD;  Location: [facility];  Service: Plastics;  Laterality: Right;  . BREAST SURGERY    . CESAREAN SECTION     x 2  . COLONOSCOPY WITH PROPOFOL N/A 04/08/2017   Procedure: COLONOSCOPY WITH PROPOFOL;  Surgeon: Garlan Fair, MD;  [facility];  Service: Endoscopy;  Laterality: N/A;  . HERNIA REPAIR    . MASTECTOMY Left 1979  . TONSILLECTOMY    . TOTAL MASTECTOMY Right 12/13/2015   Procedure: RIGHT MASTECTOMY ;  Surgeon: Stark Klein, MD;  Location: [facility];  Service: General;  Laterality: Right;    Social History   Socioeconomic History  . Marital status: Married    Spouse name: Not on file  . Number of children: 2  . Years of education: Not on file  . Highest education level: Not on file  Occupational History  . Not on file  Tobacco Use  . Smoking status: Never Smoker  . Smokeless tobacco: Never Used  Vaping Use  . Vaping Use: Never used  Substance and Sexual Activity  . Alcohol use: No  . Drug use: No  . Sexual activity: Not on file  Other Topics Concern  . Not on  file  Social History Narrative  . Not on file   Social Determinants of Health   Financial Resource Strain:   . Difficulty of Paying Living Expenses: Not on file  Food Insecurity:   . Worried About Charity fundraiser in the Last Year: Not on file  . Ran Out of Food in the Last Year: Not on file  Transportation Needs:   . Lack of Transportation (Medical): Not on file  . Lack of Transportation (Non-Medical): Not on file  Physical Activity:   . Days of Exercise per Week: Not on file  . Minutes of Exercise per Session: Not on file  Stress:   . Feeling of Stress :  Not on file  Social Connections:   . Frequency of Communication with Friends and Family: Not on file  . Frequency of Social Gatherings with Friends and Family: Not on file  . Attends Religious Services: Not on file  . Active Member of Clubs or Organizations: Not on file  . Attends Archivist Meetings: Not on file  . Marital Status: Not on file  Intimate Partner Violence:   . Fear of Current or Ex-Partner: Not on file  . Emotionally Abused: Not on file  . Physically Abused: Not on file  . Sexually Abused: Not on file    Family History  Problem Relation Age of Onset  . Other Mother        malignant neoplasm  . Bone cancer Father        malignant neoplasm  . Other Sister        malignant neoplasm    ROS: no fevers or chills, productive cough, hemoptysis, dysphasia, odynophagia, melena, hematochezia, dysuria, hematuria, rash, seizure activity, orthopnea, PND, pedal edema, claudication. Remaining systems are negative.  Physical Exam: Well-developed well-nourished in no acute distress.  Skin is warm and dry.  HEENT is normal.  Neck is supple. Bruits noted. Chest is clear to auscultation with normal expansion.  Cardiovascular exam is regular rate and rhythm.  2/6 diastolic murmur left sternal border. Abdominal exam nontender or distended. No masses palpated. Extremities show no edema. neuro grossly intact  ECG-sinus rhythm at a rate of 61, cannot rule out septal infarct.  Personally reviewed  A/P  1 hypertension-blood pressure controlled.  Continue present medications and follow.  2 aortic insufficiency-mild on most recent echocardiogram.  However diastolic murmur sounds louder on examination.  Repeat echocardiogram.  3 carotid bruits-schedule ultrasound to further assess.  4 dyspnea/cough-patient was prescribed albuterol which improved her symptoms but has caused palpitations/heart racing.  If this persists I have asked her to contact pulmonary as she may need a  different bronchodilator.  Can consider a monitor in the future if she has symptoms despite discontinuing albuterol.  Kirk Ruths, MD

## 2020-10-18 ENCOUNTER — Encounter: Payer: Self-pay | Admitting: Cardiology

## 2020-10-18 ENCOUNTER — Ambulatory Visit: Payer: Medicare PPO | Admitting: Cardiology

## 2020-10-18 ENCOUNTER — Other Ambulatory Visit: Payer: Self-pay

## 2020-10-18 VITALS — BP 140/65 | HR 63 | Temp 97.9°F | Ht 61.0 in | Wt 90.8 lb

## 2020-10-18 DIAGNOSIS — R0609 Other forms of dyspnea: Secondary | ICD-10-CM

## 2020-10-18 DIAGNOSIS — I10 Essential (primary) hypertension: Secondary | ICD-10-CM | POA: Diagnosis not present

## 2020-10-18 DIAGNOSIS — I351 Nonrheumatic aortic (valve) insufficiency: Secondary | ICD-10-CM | POA: Diagnosis not present

## 2020-10-18 DIAGNOSIS — R0600 Dyspnea, unspecified: Secondary | ICD-10-CM | POA: Diagnosis not present

## 2020-10-18 DIAGNOSIS — R0989 Other specified symptoms and signs involving the circulatory and respiratory systems: Secondary | ICD-10-CM

## 2020-10-18 NOTE — Patient Instructions (Signed)
  Testing/Procedures:  Your physician has requested that you have an echocardiogram. Echocardiography is a painless test that uses sound waves to create images of your heart. It provides your doctor with information about the size and shape of your heart and how well your heart's chambers and valves are working. This procedure takes approximately one hour. There are no restrictions for this procedure.[address]  Your physician has requested that you have a carotid duplex. This test is an ultrasound of the carotid arteries in your neck. It looks at blood flow through these arteries that supply the brain with blood. Allow one hour for this exam. There are no restrictions or special instructions.[facility]    Follow-Up: At [facility], you and your health needs are our priority.  As part of our continuing mission to provide you with exceptional heart care, we have created designated Provider Care Teams.  These Care Teams include your primary Cardiologist (physician) and Advanced Practice Providers (APPs -  Physician Assistants and Nurse Practitioners) who all work together to provide you with the care you need, when you need it.  We recommend signing up for the patient portal called "MyChart".  Sign up information is provided on this After Visit Summary.  MyChart is used to connect with patients for Virtual Visits (Telemedicine).  Patients are able to view lab/test results, encounter notes, upcoming appointments, etc.  Non-urgent messages can be sent to your provider as well.   To learn more about what you can do with MyChart, go to [email or web address].    Your next appointment:   12 month(s)  The format for your next appointment:   In Person  Provider:   Brian Crenshaw, MD    

## 2020-10-19 ENCOUNTER — Ambulatory Visit (HOSPITAL_COMMUNITY)
Admission: RE | Admit: 2020-10-19 | Discharge: 2020-10-19 | Disposition: A | Discharge status: Home / Self Care | Payer: Medicare PPO | Source: Ambulatory Visit | Attending: Cardiology | Admitting: Cardiology

## 2020-10-19 DIAGNOSIS — R0989 Other specified symptoms and signs involving the circulatory and respiratory systems: Secondary | ICD-10-CM | POA: Diagnosis not present

## 2020-10-20 ENCOUNTER — Telehealth: Payer: Self-pay | Admitting: Pulmonary Disease

## 2020-10-20 NOTE — Telephone Encounter (Signed)
Yes unfortunately it is common for her to see an increase in her heart rate.  We can switch her to ipratropium nebs alone and see if this helps.  Thanks, every 6 hours prn   Otherwise she can try using the albuterol if the HR doesn't bother her too bad    Garner Nash, DO [facility] 10/20/2020 6:33 PM

## 2020-10-20 NOTE — Telephone Encounter (Signed)
Called and spoke with pt who stated she has experienced racing heartbeat twice since being on the albuterol neb sol. Pt said that it has given her great relief but since she has had this happen she wanted to call to see if there was something else that might be able to be prescribed in its place as she was talking with her cardiologist and they told her that the racing heartbeat was a side effect of albuterol.  Pt said at first when she received the solution, she was using it once every day and was having great relief from it. She then said she started using it every other day doing it just once and she said she was still having great relief from it.  Dr. Valeta Harms, please advise if there is a different neb sol that could be prescribed for pt in place of the albuterol due to her having side effect of tachycardia.

## 2020-10-21 MED ORDER — IPRATROPIUM BROMIDE 0.02 % IN SOLN: 0.5000 mg | Freq: Four times a day (QID) | RESPIRATORY_TRACT | 12 refills | Status: DC | PRN

## 2020-10-21 NOTE — Telephone Encounter (Signed)
Called and spoke with pt letting her know the recommendations per Dr. Valeta Harms and she verbalized understanding. Pt stated that she would like to have ipratropium sol sent to pharmacy for her. This has been called into the preferred pharmacy for pt. Nothing further needed.

## 2020-11-14 ENCOUNTER — Other Ambulatory Visit (HOSPITAL_COMMUNITY): Payer: Medicare PPO

## 2020-11-15 DIAGNOSIS — M810 Age-related osteoporosis without current pathological fracture: Secondary | ICD-10-CM | POA: Diagnosis not present

## 2020-11-23 ENCOUNTER — Other Ambulatory Visit: Payer: Self-pay

## 2020-11-23 ENCOUNTER — Ambulatory Visit (HOSPITAL_COMMUNITY): Payer: Medicare PPO | Attending: Internal Medicine

## 2020-11-23 DIAGNOSIS — I351 Nonrheumatic aortic (valve) insufficiency: Secondary | ICD-10-CM | POA: Insufficient documentation

## 2020-11-23 LAB — ECHOCARDIOGRAM COMPLETE
Area-P 1/2: 3.23 cm2
P 1/2 time: 505 msec
S' Lateral: 2.4 cm

## 2020-12-21 ENCOUNTER — Other Ambulatory Visit: Payer: Self-pay | Admitting: Cardiology

## 2021-02-07 DIAGNOSIS — I491 Atrial premature depolarization: Secondary | ICD-10-CM | POA: Diagnosis not present

## 2021-02-07 DIAGNOSIS — Z1389 Encounter for screening for other disorder: Secondary | ICD-10-CM | POA: Diagnosis not present

## 2021-02-07 DIAGNOSIS — I10 Essential (primary) hypertension: Secondary | ICD-10-CM | POA: Diagnosis not present

## 2021-02-07 DIAGNOSIS — Z853 Personal history of malignant neoplasm of breast: Secondary | ICD-10-CM | POA: Diagnosis not present

## 2021-02-07 DIAGNOSIS — Z0000 Encounter for general adult medical examination without abnormal findings: Secondary | ICD-10-CM | POA: Diagnosis not present

## 2021-03-09 DIAGNOSIS — H524 Presbyopia: Secondary | ICD-10-CM | POA: Diagnosis not present

## 2021-03-09 DIAGNOSIS — H5203 Hypermetropia, bilateral: Secondary | ICD-10-CM | POA: Diagnosis not present

## 2021-03-09 DIAGNOSIS — H52203 Unspecified astigmatism, bilateral: Secondary | ICD-10-CM | POA: Diagnosis not present

## 2021-03-09 DIAGNOSIS — H25813 Combined forms of age-related cataract, bilateral: Secondary | ICD-10-CM | POA: Diagnosis not present

## 2021-04-12 DIAGNOSIS — H25011 Cortical age-related cataract, right eye: Secondary | ICD-10-CM | POA: Diagnosis not present

## 2021-04-12 DIAGNOSIS — H25812 Combined forms of age-related cataract, left eye: Secondary | ICD-10-CM | POA: Diagnosis not present

## 2021-04-12 DIAGNOSIS — H2511 Age-related nuclear cataract, right eye: Secondary | ICD-10-CM | POA: Diagnosis not present

## 2021-04-19 DIAGNOSIS — H2512 Age-related nuclear cataract, left eye: Secondary | ICD-10-CM | POA: Diagnosis not present

## 2021-04-19 DIAGNOSIS — H25012 Cortical age-related cataract, left eye: Secondary | ICD-10-CM | POA: Diagnosis not present

## 2021-05-18 DIAGNOSIS — M810 Age-related osteoporosis without current pathological fracture: Secondary | ICD-10-CM | POA: Diagnosis not present

## 2021-07-17 DIAGNOSIS — I10 Essential (primary) hypertension: Secondary | ICD-10-CM | POA: Diagnosis not present

## 2021-07-17 DIAGNOSIS — M810 Age-related osteoporosis without current pathological fracture: Secondary | ICD-10-CM | POA: Diagnosis not present

## 2021-08-28 DIAGNOSIS — Z803 Family history of malignant neoplasm of breast: Secondary | ICD-10-CM | POA: Diagnosis not present

## 2021-08-28 DIAGNOSIS — J40 Bronchitis, not specified as acute or chronic: Secondary | ICD-10-CM | POA: Diagnosis not present

## 2021-08-28 DIAGNOSIS — Z681 Body mass index (BMI) 19 or less, adult: Secondary | ICD-10-CM | POA: Diagnosis not present

## 2021-08-28 DIAGNOSIS — I10 Essential (primary) hypertension: Secondary | ICD-10-CM | POA: Diagnosis not present

## 2021-08-28 DIAGNOSIS — R636 Underweight: Secondary | ICD-10-CM | POA: Diagnosis not present

## 2021-08-28 DIAGNOSIS — Z8249 Family history of ischemic heart disease and other diseases of the circulatory system: Secondary | ICD-10-CM | POA: Diagnosis not present

## 2021-08-28 DIAGNOSIS — Z79899 Other long term (current) drug therapy: Secondary | ICD-10-CM | POA: Diagnosis not present

## 2021-08-28 DIAGNOSIS — M810 Age-related osteoporosis without current pathological fracture: Secondary | ICD-10-CM | POA: Diagnosis not present

## 2021-08-28 DIAGNOSIS — Z853 Personal history of malignant neoplasm of breast: Secondary | ICD-10-CM | POA: Diagnosis not present

## 2021-08-30 DIAGNOSIS — Z23 Encounter for immunization: Secondary | ICD-10-CM | POA: Diagnosis not present

## 2021-09-07 DIAGNOSIS — Z961 Presence of intraocular lens: Secondary | ICD-10-CM | POA: Diagnosis not present

## 2021-09-18 DIAGNOSIS — M810 Age-related osteoporosis without current pathological fracture: Secondary | ICD-10-CM | POA: Diagnosis not present

## 2021-09-18 DIAGNOSIS — E559 Vitamin D deficiency, unspecified: Secondary | ICD-10-CM | POA: Diagnosis not present

## 2021-09-18 DIAGNOSIS — I10 Essential (primary) hypertension: Secondary | ICD-10-CM | POA: Diagnosis not present

## 2021-10-09 ENCOUNTER — Other Ambulatory Visit: Payer: Self-pay | Admitting: Cardiology

## 2021-10-09 ENCOUNTER — Telehealth: Payer: Self-pay | Admitting: Pulmonary Disease

## 2021-10-09 ENCOUNTER — Other Ambulatory Visit: Payer: Self-pay

## 2021-10-09 MED ORDER — IPRATROPIUM BROMIDE 0.02 % IN SOLN: 0.5000 mg | Freq: Four times a day (QID) | RESPIRATORY_TRACT | 12 refills | Status: DC | PRN

## 2021-10-09 NOTE — Telephone Encounter (Signed)
Tried to call pt again but still no answer and unable to leave VM.

## 2021-10-09 NOTE — Telephone Encounter (Signed)
ATC no VM available. Refill sent in to [address].

## 2021-10-10 NOTE — Telephone Encounter (Signed)
Closing per protocol 

## 2021-10-30 ENCOUNTER — Other Ambulatory Visit: Payer: Self-pay

## 2021-10-30 ENCOUNTER — Encounter: Payer: Self-pay | Admitting: Pulmonary Disease

## 2021-10-30 ENCOUNTER — Ambulatory Visit: Payer: Medicare PPO | Admitting: Pulmonary Disease

## 2021-10-30 DIAGNOSIS — J479 Bronchiectasis, uncomplicated: Secondary | ICD-10-CM | POA: Diagnosis not present

## 2021-10-30 DIAGNOSIS — R062 Wheezing: Secondary | ICD-10-CM | POA: Diagnosis not present

## 2021-10-30 MED ORDER — IPRATROPIUM BROMIDE 0.02 % IN SOLN: 0.5000 mg | Freq: Four times a day (QID) | RESPIRATORY_TRACT | 12 refills | Status: DC | PRN

## 2021-10-30 NOTE — Progress Notes (Signed)
Synopsis: Referred in Jan 2020 for SOB by Carol Ada, MD  Subjective:   PATIENT ID: Sarah Lewis GENDER: female DOB: 1936-05-08, MRN: [id number]  Chief Complaint  Patient presents with   Follow-up    PMH of HTN, on medications, h/o breast cancer s/p BL mastectomy. SOB for the past 5-6 months, most notice when walking up steps or up the hill. She feels like she has to stop. Nothing has made it better or worse. She feels all right if she stops to catch her breath. Lives here in [place], [place]. 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 She does have a cough. Nothing comes up with it. Dry non-productive cough. She really had a bad cough when she was on lisinopril. Much better after coming off this. Life long non-smoker. No alcohol use. Married, 2 children, 5 grandkids.   OV 09/19/2020: Here today for follow-up.  Still having ongoing dyspnea on exertion on occasion.  Does have dry nonproductive cough.  She does have mild areas of scarring within the lung on her recent CT scan in 2020.  She had work-up to include pulmonary function test which did really show areas of restriction with impaired spirometry although be it mild.  She does have small areas of air trapping.  Review of the images myself do show some areas of bronchiectasis the tree-in-bud nodules which may in fact reflect MAI.  We discussed this today in the office.  Her productive cough is still nonproductive and her weight has been stable.  She is a little concerned about having blood clots.  But she has no risk factors for these no recent surgeries no trauma no swelling in the legs no chest pain.  She was worried because she had a friend recently diagnosed with these and is wondering if this could still be her problem.  OV 10/30/2021: Here for follow-up.  Was initially seen for ongoing dyspnea dry nonproductive cough.  She has some areas of mild bronchiectasis.  She was started on a trial of nebulizers.   She is only been using the ipratropium nasal nebulizer portion and has been doing really well with this.  She feels like her symptoms have totally been erased.  No other significant shortness of breath issues.  She uses it first thing in the morning.  Her coughing spells have almost completely dissipated.  She would like to continue and stay on this if at all possible.   Past Medical History:  Diagnosis Date   Anemia yrs ago   Aortic insufficiency    Breast cancer of lower-outer quadrant of right female breast ([place]) 10/10/2015   left breat also chemo done for first   Cough    from toporol   Heart murmur    Hypertension    Osteoporosis    Spinal headache yrs ago     Family History  Problem Relation Age of Onset   Other Mother        malignant neoplasm   Bone cancer Father        malignant neoplasm   Other Sister        malignant neoplasm     Past Surgical History:  Procedure Laterality Date   ABDOMINAL HYSTERECTOMY     total   APPENDECTOMY     BREAST RECONSTRUCTION WITH PLACEMENT OF TISSUE EXPANDER AND FLEX HD (ACELLULAR HYDRATED DERMIS) Right 12/13/2015   Procedure: RIGHT BREAST RECONSTRUCTION WITH PLACEMENT OF silicone gel implant and allograft tissue;  Surgeon: Crissie Reese, MD;  Location: [facility];  Service: Plastics;  Laterality: Right;   BREAST SURGERY     CESAREAN SECTION     x 2   COLONOSCOPY WITH PROPOFOL N/A 04/08/2017   Procedure: COLONOSCOPY WITH PROPOFOL;  Surgeon: Garlan Fair, MD;  Location: [facility];  Service: Endoscopy;  Laterality: N/A;   HERNIA REPAIR     MASTECTOMY Left 1979   TONSILLECTOMY     TOTAL MASTECTOMY Right 12/13/2015   Procedure: RIGHT MASTECTOMY ;  Surgeon: Stark Klein, MD;  Location: [facility];  Service: General;  Laterality: Right;    Social History   Socioeconomic History   Marital status: Married    Spouse name: Not on file   Number of children: 2   Years of education: Not on file   Highest education level: Not on file   Occupational History   Not on file  Tobacco Use   Smoking status: Never   Smokeless tobacco: Never  Vaping Use   Vaping Use: Never used  Substance and Sexual Activity   Alcohol use: No   Drug use: No   Sexual activity: Not on file  Other Topics Concern   Not on file  Social History Narrative   Not on file   Social Determinants of Health   Financial Resource Strain: Not on file  Food Insecurity: Not on file  Transportation Needs: Not on file  Physical Activity: Not on file  Stress: Not on file  Social Connections: Not on file  Intimate Partner Violence: Not on file     No Known Allergies   Outpatient Medications Prior to Visit  Medication Sig Dispense Refill   amLODipine (NORVASC) 2.5 MG tablet Take 1 tablet (2.5 mg total) by mouth daily. PATIENT NEEDS TO SCHEDULE AN APPOINTMENT TO RECEIVE FUTURE REFILLS. 90 tablet 3   Calcium Carb-Cholecalciferol (CALCIUM-VITAMIN D) 500-400 MG-UNIT TABS Take 1 tablet by mouth 2 (two) times daily.     Cholecalciferol (VITAMIN D-3) 25 MCG (1000 UT) CAPS Take 1 capsule by mouth daily.     ipratropium (ATROVENT) 0.02 % nebulizer solution Take 2.5 mLs (0.5 mg total) by nebulization every 6 (six) hours as needed for wheezing or shortness of breath. (Patient taking differently: Take 0.5 mg by nebulization every 6 (six) hours as needed for wheezing or shortness of breath. Take once a day) 75 mL 12   metoprolol succinate (TOPROL-XL) 100 MG 24 hr tablet Take 100 mg by mouth daily. Take with or immediately following a meal.     albuterol (PROVENTIL) (2.5 MG/3ML) 0.083% nebulizer solution Take 3 mLs (2.5 mg total) by nebulization every 6 (six) hours as needed for wheezing or shortness of breath. (Patient not taking: Reported on 10/30/2021) 75 mL 12   No facility-administered medications prior to visit.    Review of Systems  Constitutional:  Negative for chills, fever, malaise/fatigue and weight loss.  HENT:  Negative for hearing loss, sore throat and  tinnitus.   Eyes:  Negative for blurred vision and double vision.  Respiratory:  Positive for cough. Negative for hemoptysis, sputum production, shortness of breath, wheezing and stridor.   Cardiovascular:  Negative for chest pain, palpitations, orthopnea, leg swelling and PND.  Gastrointestinal:  Negative for abdominal pain, constipation, diarrhea, heartburn, nausea and vomiting.  Genitourinary:  Negative for dysuria, hematuria and urgency.  Musculoskeletal:  Negative for joint pain and myalgias.  Skin:  Negative for itching and rash.  Neurological:  Negative for dizziness, tingling, weakness and headaches.  Endo/Heme/Allergies:  Negative for environmental  allergies. Does not bruise/bleed easily.  Psychiatric/Behavioral:  Negative for depression. The patient is not nervous/anxious and does not have insomnia.   All other systems reviewed and are negative.   Objective:  Physical Exam Vitals reviewed.  Constitutional:      General: She is not in acute distress.    Appearance: She is well-developed.     Comments: Thin, low muscle mass  HENT:     Head: Normocephalic and atraumatic.  Eyes:     General: No scleral icterus.    Conjunctiva/sclera: Conjunctivae normal.     Pupils: Pupils are equal, round, and reactive to light.  Neck:     Vascular: No JVD.     Trachea: No tracheal deviation.  Cardiovascular:     Rate and Rhythm: Normal rate and regular rhythm.     Heart sounds: Normal heart sounds. No murmur heard. Pulmonary:     Effort: Pulmonary effort is normal. No tachypnea, accessory muscle usage or respiratory distress.     Breath sounds: Normal breath sounds. No stridor. No wheezing, rhonchi or rales.  Abdominal:     Tenderness: There is no abdominal tenderness.  Musculoskeletal:        General: No tenderness.     Cervical back: Neck supple.  Lymphadenopathy:     Cervical: No cervical adenopathy.  Skin:    General: Skin is warm and dry.     Capillary Refill: Capillary  refill takes less than 2 seconds.     Findings: No rash.  Neurological:     Mental Status: She is alert and oriented to person, place, and time.  Psychiatric:        Behavior: Behavior normal.     Vitals:   10/30/21 1541  BP: 118/64  Pulse: 61  Temp: 98.1 F (36.7 C)  TempSrc: Oral  SpO2: 95%  Weight: 92 lb 6.4 oz (41.9 kg)  Height: 5' (1.524 m)   95% on RA BMI Readings from Last 3 Encounters:  10/30/21 18.05 kg/m  10/18/20 17.16 kg/m  09/19/20 16.85 kg/m   Wt Readings from Last 3 Encounters:  10/30/21 92 lb 6.4 oz (41.9 kg)  10/18/20 90 lb 12.8 oz (41.2 kg)  09/19/20 89 lb 3.2 oz (40.5 kg)     CBC    Component Value Date/Time   WBC 8.2 07/20/2020 0843   WBC 6.8 05/01/2017 1341   WBC 7.3 12/07/2015 1052   RBC 4.60 07/20/2020 0843   HGB 14.5 07/20/2020 0843   HGB 12.5 05/01/2017 1341   HCT 43.3 07/20/2020 0843   HCT 37.0 05/01/2017 1341   PLT 285 07/20/2020 0843   PLT 242 05/01/2017 1341   MCV 94.1 07/20/2020 0843   MCV 91.9 05/01/2017 1341   MCH 31.5 07/20/2020 0843   MCHC 33.5 07/20/2020 0843   RDW 12.9 07/20/2020 0843   RDW 12.8 05/01/2017 1341   LYMPHSABS 1.4 07/20/2020 0843   LYMPHSABS 1.8 05/01/2017 1341   MONOABS 0.6 07/20/2020 0843   MONOABS 0.5 05/01/2017 1341   EOSABS 0.2 07/20/2020 0843   EOSABS 0.1 05/01/2017 1341   BASOSABS 0.1 07/20/2020 0843   BASOSABS 0.1 05/01/2017 1341    Chest Imaging: CXR - 2016  - no active process The patient's images have been independently reviewed by me.    01/23/2019 high-resolution CT chest: No fibrotic disease.  Small areas of clustered nodules within the right middle lobe right upper lobe.  Small areas of air trapping. The patient's images have been independently reviewed by me.  Pulmonary Functions Testing Results: PFT Results Latest Ref Rng & Units 01/08/2019  FVC-Pre L 1.08  FVC-Predicted Pre % 53  FVC-Post L 1.10  FVC-Predicted Post % 54  Pre FEV1/FVC % % 76  Post FEV1/FCV % % 80   FEV1-Pre L 0.82  FEV1-Predicted Pre % 55  FEV1-Post L 0.88  TLC L 3.65  TLC % Predicted % 84  RV % Predicted % 120    FeNO: None   Pathology: None   Echocardiogram:  09/2018 Study Conclusions   - Left ventricle: The cavity size was normal. Systolic function was   normal. The estimated ejection fraction was in the range of 60%   to 65%. Wall motion was normal; there were no regional wall   motion abnormalities. Doppler parameters are consistent with   abnormal left ventricular relaxation (grade 1 diastolic   dysfunction). Doppler parameters are consistent with high   ventricular filling pressure. - Aortic valve: Transvalvular velocity was within the normal range.   There was no stenosis. There was mild regurgitation. - Mitral valve: Transvalvular velocity was within the normal range.   There was no evidence for stenosis. There was no regurgitation. - Left atrium: The atrium was moderately dilated. - Right ventricle: The cavity size was mildly dilated. Wall   thickness was normal. Systolic function was normal. - Tricuspid valve: There was moderate regurgitation. - Pulmonary arteries: Systolic pressure was mildly increased. PA   peak pressure: 48 mm Hg (S). - Global longitudinal strain -19.4% (normal).  Heart Catheterization: None     Assessment & Plan:   Bronchiectasis without complication ([place])  Wheezing  Discussion:  This is an 85 year old female, longstanding history of cough, initially was thought to be related to ACE inhibitor which was stopped but never recovered after this was discontinued still had ongoing symptoms.  No significant GERD or postnasal drip.  She did have a spirometry and pulmonary function which showed impaired spirometry with mild restriction there was some evidence of air trapping.  She had mildly elevated PA pressures on her echocardiogram which also could be consistent with dyspnea.  Ultimately had an HRCT scan of the chest which had evidence of  mild bronchiectasis.  On occasion she did have wheezing.  Plan: She had a trial of albuterol and ipratropium. Ultimately has been using solely ipratropium in the morning. She had a significant improvement in her symptoms. She is no longer having her cough anymore. She is able to complete her activities of daily living without reserve. She would like to continue on this nebulizer regimen at this time. Refills given for albuterol.  Return to clinic to see [place] in 1 year or as needed.   Current Outpatient Medications:    amLODipine (NORVASC) 2.5 MG tablet, Take 1 tablet (2.5 mg total) by mouth daily. PATIENT NEEDS TO SCHEDULE AN APPOINTMENT TO RECEIVE FUTURE REFILLS., Disp: 90 tablet, Rfl: 3   Calcium Carb-Cholecalciferol (CALCIUM-VITAMIN D) 500-400 MG-UNIT TABS, Take 1 tablet by mouth 2 (two) times daily., Disp: , Rfl:    Cholecalciferol (VITAMIN D-3) 25 MCG (1000 UT) CAPS, Take 1 capsule by mouth daily., Disp: , Rfl:    ipratropium (ATROVENT) 0.02 % nebulizer solution, Take 2.5 mLs (0.5 mg total) by nebulization every 6 (six) hours as needed for wheezing or shortness of breath. (Patient taking differently: Take 0.5 mg by nebulization every 6 (six) hours as needed for wheezing or shortness of breath. Take once a day), Disp: 75 mL, Rfl: 12   metoprolol succinate (TOPROL-XL) 100  MG 24 hr tablet, Take 100 mg by mouth daily. Take with or immediately following a meal., Disp: , Rfl:    albuterol (PROVENTIL) (2.5 MG/3ML) 0.083% nebulizer solution, Take 3 mLs (2.5 mg total) by nebulization every 6 (six) hours as needed for wheezing or shortness of breath. (Patient not taking: Reported on 10/30/2021), Disp: 75 mL, Rfl: 12   Garner Nash, DO [facility] 10/30/2021 3:48 PM

## 2021-10-30 NOTE — Patient Instructions (Signed)
Thank you for visiting Dr. Valeta Harms at [facility]. Today we recommend the following:  Meds ordered this encounter  Medications   ipratropium (ATROVENT) 0.02 % nebulizer solution    Sig: Take 2.5 mLs (0.5 mg total) by nebulization every 6 (six) hours as needed for wheezing or shortness of breath.    Dispense:  75 mL    Refill:  12    Dx: J47.9, R06.2   Return in about 1 year (around 10/30/2022), or if symptoms worsen or fail to improve, for w/ Dr. Valeta Harms .    Please do your part to reduce the spread of COVID-19.

## 2021-11-14 DIAGNOSIS — K130 Diseases of lips: Secondary | ICD-10-CM | POA: Diagnosis not present

## 2021-11-14 DIAGNOSIS — B078 Other viral warts: Secondary | ICD-10-CM | POA: Diagnosis not present

## 2021-11-23 DIAGNOSIS — M810 Age-related osteoporosis without current pathological fracture: Secondary | ICD-10-CM | POA: Diagnosis not present

## 2022-01-19 DIAGNOSIS — L308 Other specified dermatitis: Secondary | ICD-10-CM | POA: Diagnosis not present

## 2022-01-19 DIAGNOSIS — L570 Actinic keratosis: Secondary | ICD-10-CM | POA: Diagnosis not present

## 2022-01-19 DIAGNOSIS — X32XXXD Exposure to sunlight, subsequent encounter: Secondary | ICD-10-CM | POA: Diagnosis not present

## 2022-02-06 DIAGNOSIS — H269 Unspecified cataract: Secondary | ICD-10-CM | POA: Diagnosis not present

## 2022-02-06 DIAGNOSIS — I739 Peripheral vascular disease, unspecified: Secondary | ICD-10-CM | POA: Diagnosis not present

## 2022-02-06 DIAGNOSIS — I10 Essential (primary) hypertension: Secondary | ICD-10-CM | POA: Diagnosis not present

## 2022-02-06 DIAGNOSIS — J479 Bronchiectasis, uncomplicated: Secondary | ICD-10-CM | POA: Diagnosis not present

## 2022-02-06 DIAGNOSIS — Z79620 Long term (current) use of immunosuppressive biologic: Secondary | ICD-10-CM | POA: Diagnosis not present

## 2022-02-06 DIAGNOSIS — R636 Underweight: Secondary | ICD-10-CM | POA: Diagnosis not present

## 2022-02-06 DIAGNOSIS — M810 Age-related osteoporosis without current pathological fracture: Secondary | ICD-10-CM | POA: Diagnosis not present

## 2022-02-06 DIAGNOSIS — Z803 Family history of malignant neoplasm of breast: Secondary | ICD-10-CM | POA: Diagnosis not present

## 2022-02-06 DIAGNOSIS — Z681 Body mass index (BMI) 19 or less, adult: Secondary | ICD-10-CM | POA: Diagnosis not present

## 2022-02-21 DIAGNOSIS — M810 Age-related osteoporosis without current pathological fracture: Secondary | ICD-10-CM | POA: Diagnosis not present

## 2022-02-21 DIAGNOSIS — Z0000 Encounter for general adult medical examination without abnormal findings: Secondary | ICD-10-CM | POA: Diagnosis not present

## 2022-02-21 DIAGNOSIS — I10 Essential (primary) hypertension: Secondary | ICD-10-CM | POA: Diagnosis not present

## 2022-02-21 DIAGNOSIS — Z853 Personal history of malignant neoplasm of breast: Secondary | ICD-10-CM | POA: Diagnosis not present

## 2022-02-21 DIAGNOSIS — Z1389 Encounter for screening for other disorder: Secondary | ICD-10-CM | POA: Diagnosis not present

## 2022-02-26 DIAGNOSIS — D225 Melanocytic nevi of trunk: Secondary | ICD-10-CM | POA: Diagnosis not present

## 2022-02-26 DIAGNOSIS — Z1283 Encounter for screening for malignant neoplasm of skin: Secondary | ICD-10-CM | POA: Diagnosis not present

## 2022-02-26 DIAGNOSIS — L820 Inflamed seborrheic keratosis: Secondary | ICD-10-CM | POA: Diagnosis not present

## 2022-02-26 DIAGNOSIS — L821 Other seborrheic keratosis: Secondary | ICD-10-CM | POA: Diagnosis not present

## 2022-03-28 DIAGNOSIS — L308 Other specified dermatitis: Secondary | ICD-10-CM | POA: Diagnosis not present

## 2022-04-06 NOTE — Progress Notes (Signed)
? ? ? ? ?HPI: FU hypertension. Chest CT February 2020 showed coronary calcification. Carotid Dopplers November 2021 showed 1 to 39% right and no stenosis on the left.  Echocardiogram December 2021 showed normal LV function, grade 2 diastolic dysfunction, moderate right ventricular enlargement, moderate pulmonary hypertension, mild biatrial enlargement, bileaflet mitral valve prolapse with trace mitral regurgitation, moderate to severe tricuspid regurgitation, mild to moderate aortic insufficiency and small pericardial effusion.  Since last seen she denies dyspnea, chest pain, palpitations or syncope.  No pedal edema. ? ?Current Outpatient Medications  ?Medication Sig Dispense Refill  ? albuterol (PROVENTIL) (2.5 MG/3ML) 0.083% nebulizer solution Take 3 mLs (2.5 mg total) by nebulization every 6 (six) hours as needed for wheezing or shortness of breath. 75 mL 12  ? amLODipine (NORVASC) 2.5 MG tablet Take 1 tablet (2.5 mg total) by mouth daily. PATIENT NEEDS TO SCHEDULE AN APPOINTMENT TO RECEIVE FUTURE REFILLS. 90 tablet 3  ? Calcium Carb-Cholecalciferol (CALCIUM-VITAMIN D) 500-400 MG-UNIT TABS Take 1 tablet by mouth 2 (two) times daily.    ? Cholecalciferol (VITAMIN D-3) 25 MCG (1000 UT) CAPS Take 1 capsule by mouth daily.    ? ipratropium (ATROVENT) 0.02 % nebulizer solution Take 2.5 mLs (0.5 mg total) by nebulization every 6 (six) hours as needed for wheezing or shortness of breath. 75 mL 12  ? metoprolol succinate (TOPROL-XL) 100 MG 24 hr tablet Take 100 mg by mouth daily. Take with or immediately following a meal.    ? ?No current facility-administered medications for this visit.  ? ? ? ?Past Medical History:  ?Diagnosis Date  ? Anemia yrs ago  ? Aortic insufficiency   ? Breast cancer of lower-outer quadrant of right female breast ([place]) 10/10/2015  ? left breat also chemo done for first  ? Cough   ? from toporol  ? Heart murmur   ? Hypertension   ? Osteoporosis   ? Spinal headache yrs ago  ? ? ?Past Surgical  History:  ?Procedure Laterality Date  ? ABDOMINAL HYSTERECTOMY    ? total  ? APPENDECTOMY    ? BREAST RECONSTRUCTION WITH PLACEMENT OF TISSUE EXPANDER AND FLEX HD (ACELLULAR HYDRATED DERMIS) Right 12/13/2015  ? Procedure: RIGHT BREAST RECONSTRUCTION WITH PLACEMENT OF silicone gel implant and allograft tissue;  Surgeon: Crissie Reese, MD;  Location: [place];  Service: Plastics;  Laterality: Right;  ? BREAST SURGERY    ? CESAREAN SECTION    ? x 2  ? COLONOSCOPY WITH PROPOFOL N/A 04/08/2017  ? Procedure: COLONOSCOPY WITH PROPOFOL;  Surgeon: Garlan Fair, MD;  Location[facility];  Service: Endoscopy;  Laterality: N/A;  ? HERNIA REPAIR    ? MASTECTOMY Left 1979  ? TONSILLECTOMY    ? TOTAL MASTECTOMY Right 12/13/2015  ? Procedure: RIGHT MASTECTOMY ;  Surgeon: Stark Klein, MD;  Location: [facility];  Service: General;  Laterality: Right;  ? ? ?Social History  ? ?Socioeconomic History  ? Marital status: Married  ?  Spouse name: Not on file  ? Number of children: 2  ? Years of education: Not on file  ? Highest education level: Not on file  ?Occupational History  ? Not on file  ?Tobacco Use  ? Smoking status: Never  ? Smokeless tobacco: Never  ?Vaping Use  ? Vaping Use: Never used  ?Substance and Sexual Activity  ? Alcohol use: No  ? Drug use: No  ? Sexual activity: Not on file  ?Other Topics Concern  ? Not on file  ?Social History Narrative  ?  Not on file  ? ?Social Determinants of Health  ? ?Financial Resource Strain: Not on file  ?Food Insecurity: Not on file  ?Transportation Needs: Not on file  ?Physical Activity: Not on file  ?Stress: Not on file  ?Social Connections: Not on file  ?Intimate Partner Violence: Not on file  ? ? ?Family History  ?Problem Relation Age of Onset  ? Other Mother   ?     malignant neoplasm  ? Bone cancer Father   ?     malignant neoplasm  ? Other Sister   ?     malignant neoplasm  ? ? ?ROS: no fevers or chills, productive cough, hemoptysis, dysphasia, odynophagia, melena, hematochezia, dysuria,  hematuria, rash, seizure activity, orthopnea, PND, pedal edema, claudication. Remaining systems are negative. ? ?Physical Exam: ?Well-developed well-nourished in no acute distress.  ?Skin is warm and dry.  ?HEENT is normal.  ?Neck is supple.  ?Chest is clear to auscultation with normal expansion.  ?Cardiovascular exam is regular rate and rhythm.  2/6 diastolic murmur left sternal border. ?Abdominal exam nontender or distended. No masses palpated. ?Extremities show no edema. ?neuro grossly intact ? ?ECG-normal sinus rhythm at a rate of 69, left anterior fascicular block, no ST changes.  Personally reviewed ? ?A/P ? ?1 hypertension-patient's blood pressure is controlled.  Continue present medications. ? ?2 valvular heart disease-last echocardiogram showed mild to moderate aortic insufficiency and moderate to severe tricuspid regurgitation.  We will repeat study. ? ?3 Coronary calcification-we will obtain most recent lipids from primary care.  If LDL not at goal we will add statin therapy. ? ?Kirk Ruths, MD ? ? ? ?

## 2022-04-13 ENCOUNTER — Ambulatory Visit: Payer: Medicare PPO | Admitting: Cardiology

## 2022-04-13 ENCOUNTER — Encounter: Payer: Self-pay | Admitting: Cardiology

## 2022-04-13 VITALS — BP 126/78 | HR 69 | Ht 60.0 in | Wt 92.0 lb

## 2022-04-13 DIAGNOSIS — I2510 Atherosclerotic heart disease of native coronary artery without angina pectoris: Secondary | ICD-10-CM | POA: Diagnosis not present

## 2022-04-13 DIAGNOSIS — I10 Essential (primary) hypertension: Secondary | ICD-10-CM | POA: Diagnosis not present

## 2022-04-13 DIAGNOSIS — I351 Nonrheumatic aortic (valve) insufficiency: Secondary | ICD-10-CM | POA: Diagnosis not present

## 2022-04-13 NOTE — Patient Instructions (Signed)
?  Testing/Procedures: ? ?Your physician has requested that you have an echocardiogram. Echocardiography is a painless test that uses sound waves to create images of your heart. It provides your doctor with information about the size and shape of your heart and how well your heart?s chambers and valves are working. This procedure takes approximately one hour. There are no restrictions for this procedure. [place]Up: ?At [facility], you and your health needs are our priority.  As part of our continuing mission to provide you with exceptional heart care, we have created designated Provider Care Teams.  These Care Teams include your primary Cardiologist (physician) and Advanced Practice Providers (APPs -  Physician Assistants and Nurse Practitioners) who all work together to provide you with the care you need, when you need it. ? ?We recommend signing up for the patient portal called "MyChart".  Sign up information is provided on this After Visit Summary.  MyChart is used to connect with patients for Virtual Visits (Telemedicine).  Patients are able to view lab/test results, encounter notes, upcoming appointments, etc.  Non-urgent messages can be sent to your provider as well.   ?To learn more about what you can do with MyChart, go to [email or web address].   ? ?Your next appointment:   ?12 month(s) ? ?The format for your next appointment:   ?In Person ? ?Provider:   ?Kirk Ruths MD  ? ? ? ? ?Important Information About Sugar ? ? ? ? ?  ?

## 2022-04-30 ENCOUNTER — Ambulatory Visit (HOSPITAL_COMMUNITY): Payer: Medicare PPO | Attending: Cardiovascular Disease

## 2022-04-30 DIAGNOSIS — I351 Nonrheumatic aortic (valve) insufficiency: Secondary | ICD-10-CM | POA: Diagnosis not present

## 2022-04-30 LAB — ECHOCARDIOGRAM COMPLETE
Area-P 1/2: 3.77 cm2
P 1/2 time: 446 msec
S' Lateral: 2.4 cm

## 2022-05-01 ENCOUNTER — Other Ambulatory Visit: Payer: Self-pay | Admitting: *Deleted

## 2022-05-01 DIAGNOSIS — I351 Nonrheumatic aortic (valve) insufficiency: Secondary | ICD-10-CM

## 2022-05-31 DIAGNOSIS — M810 Age-related osteoporosis without current pathological fracture: Secondary | ICD-10-CM | POA: Diagnosis not present

## 2022-08-30 DIAGNOSIS — Z23 Encounter for immunization: Secondary | ICD-10-CM | POA: Diagnosis not present

## 2022-08-30 DIAGNOSIS — I10 Essential (primary) hypertension: Secondary | ICD-10-CM | POA: Diagnosis not present

## 2022-08-30 DIAGNOSIS — M810 Age-related osteoporosis without current pathological fracture: Secondary | ICD-10-CM | POA: Diagnosis not present

## 2022-08-30 DIAGNOSIS — Z853 Personal history of malignant neoplasm of breast: Secondary | ICD-10-CM | POA: Diagnosis not present

## 2022-09-13 DIAGNOSIS — Z780 Asymptomatic menopausal state: Secondary | ICD-10-CM | POA: Diagnosis not present

## 2022-09-13 DIAGNOSIS — M810 Age-related osteoporosis without current pathological fracture: Secondary | ICD-10-CM | POA: Diagnosis not present

## 2022-09-27 DIAGNOSIS — Z961 Presence of intraocular lens: Secondary | ICD-10-CM | POA: Diagnosis not present

## 2022-10-16 DIAGNOSIS — M13841 Other specified arthritis, right hand: Secondary | ICD-10-CM | POA: Diagnosis not present

## 2022-11-13 DIAGNOSIS — M13841 Other specified arthritis, right hand: Secondary | ICD-10-CM | POA: Diagnosis not present

## 2022-11-28 DIAGNOSIS — I10 Essential (primary) hypertension: Secondary | ICD-10-CM | POA: Diagnosis not present

## 2022-11-28 DIAGNOSIS — M810 Age-related osteoporosis without current pathological fracture: Secondary | ICD-10-CM | POA: Diagnosis not present

## 2022-11-28 DIAGNOSIS — E559 Vitamin D deficiency, unspecified: Secondary | ICD-10-CM | POA: Diagnosis not present

## 2022-11-29 ENCOUNTER — Other Ambulatory Visit: Payer: Self-pay | Admitting: Pulmonary Disease

## 2022-12-03 DIAGNOSIS — M810 Age-related osteoporosis without current pathological fracture: Secondary | ICD-10-CM | POA: Diagnosis not present

## 2022-12-05 ENCOUNTER — Other Ambulatory Visit: Payer: Self-pay | Admitting: Cardiology

## 2022-12-25 DIAGNOSIS — M25541 Pain in joints of right hand: Secondary | ICD-10-CM | POA: Diagnosis not present

## 2022-12-25 DIAGNOSIS — M79644 Pain in right finger(s): Secondary | ICD-10-CM | POA: Diagnosis not present

## 2022-12-25 DIAGNOSIS — M2550 Pain in unspecified joint: Secondary | ICD-10-CM | POA: Diagnosis not present

## 2023-01-03 DIAGNOSIS — M79641 Pain in right hand: Secondary | ICD-10-CM | POA: Diagnosis not present

## 2023-01-03 DIAGNOSIS — M810 Age-related osteoporosis without current pathological fracture: Secondary | ICD-10-CM | POA: Diagnosis not present

## 2023-01-03 DIAGNOSIS — M1990 Unspecified osteoarthritis, unspecified site: Secondary | ICD-10-CM | POA: Diagnosis not present

## 2023-01-03 DIAGNOSIS — M79643 Pain in unspecified hand: Secondary | ICD-10-CM | POA: Diagnosis not present

## 2023-01-03 DIAGNOSIS — M0579 Rheumatoid arthritis with rheumatoid factor of multiple sites without organ or systems involvement: Secondary | ICD-10-CM | POA: Diagnosis not present

## 2023-01-03 DIAGNOSIS — M79642 Pain in left hand: Secondary | ICD-10-CM | POA: Diagnosis not present

## 2023-03-02 ENCOUNTER — Emergency Department (HOSPITAL_BASED_OUTPATIENT_CLINIC_OR_DEPARTMENT_OTHER): Payer: Medicare PPO

## 2023-03-02 ENCOUNTER — Inpatient Hospital Stay (HOSPITAL_BASED_OUTPATIENT_CLINIC_OR_DEPARTMENT_OTHER)
Admission: EM | Admit: 2023-03-02 | Discharge: 2023-03-06 | DRG: 291 | Disposition: A | Discharge status: Home / Self Care | Payer: Medicare PPO | Attending: Student | Admitting: Student

## 2023-03-02 ENCOUNTER — Other Ambulatory Visit: Payer: Self-pay

## 2023-03-02 DIAGNOSIS — Z9882 Breast implant status: Secondary | ICD-10-CM

## 2023-03-02 DIAGNOSIS — E876 Hypokalemia: Secondary | ICD-10-CM | POA: Diagnosis not present

## 2023-03-02 DIAGNOSIS — Z853 Personal history of malignant neoplasm of breast: Secondary | ICD-10-CM | POA: Diagnosis not present

## 2023-03-02 DIAGNOSIS — R636 Underweight: Secondary | ICD-10-CM | POA: Diagnosis present

## 2023-03-02 DIAGNOSIS — Z90710 Acquired absence of both cervix and uterus: Secondary | ICD-10-CM

## 2023-03-02 DIAGNOSIS — D649 Anemia, unspecified: Secondary | ICD-10-CM | POA: Diagnosis not present

## 2023-03-02 DIAGNOSIS — R0602 Shortness of breath: Secondary | ICD-10-CM | POA: Diagnosis not present

## 2023-03-02 DIAGNOSIS — M069 Rheumatoid arthritis, unspecified: Secondary | ICD-10-CM | POA: Diagnosis present

## 2023-03-02 DIAGNOSIS — I5031 Acute diastolic (congestive) heart failure: Secondary | ICD-10-CM | POA: Diagnosis not present

## 2023-03-02 DIAGNOSIS — R000 Tachycardia, unspecified: Secondary | ICD-10-CM | POA: Diagnosis not present

## 2023-03-02 DIAGNOSIS — M810 Age-related osteoporosis without current pathological fracture: Secondary | ICD-10-CM | POA: Diagnosis present

## 2023-03-02 DIAGNOSIS — R059 Cough, unspecified: Secondary | ICD-10-CM | POA: Diagnosis not present

## 2023-03-02 DIAGNOSIS — I351 Nonrheumatic aortic (valve) insufficiency: Secondary | ICD-10-CM | POA: Diagnosis present

## 2023-03-02 DIAGNOSIS — I5033 Acute on chronic diastolic (congestive) heart failure: Secondary | ICD-10-CM | POA: Diagnosis present

## 2023-03-02 DIAGNOSIS — Z9221 Personal history of antineoplastic chemotherapy: Secondary | ICD-10-CM

## 2023-03-02 DIAGNOSIS — Z9013 Acquired absence of bilateral breasts and nipples: Secondary | ICD-10-CM | POA: Diagnosis not present

## 2023-03-02 DIAGNOSIS — C50511 Malignant neoplasm of lower-outer quadrant of right female breast: Secondary | ICD-10-CM | POA: Diagnosis not present

## 2023-03-02 DIAGNOSIS — Z1501 Genetic susceptibility to malignant neoplasm of breast: Secondary | ICD-10-CM

## 2023-03-02 DIAGNOSIS — J449 Chronic obstructive pulmonary disease, unspecified: Secondary | ICD-10-CM | POA: Diagnosis not present

## 2023-03-02 DIAGNOSIS — I110 Hypertensive heart disease with heart failure: Principal | ICD-10-CM | POA: Diagnosis present

## 2023-03-02 DIAGNOSIS — J849 Interstitial pulmonary disease, unspecified: Secondary | ICD-10-CM | POA: Diagnosis not present

## 2023-03-02 DIAGNOSIS — J9811 Atelectasis: Secondary | ICD-10-CM | POA: Diagnosis present

## 2023-03-02 DIAGNOSIS — I4891 Unspecified atrial fibrillation: Secondary | ICD-10-CM | POA: Diagnosis not present

## 2023-03-02 DIAGNOSIS — M1990 Unspecified osteoarthritis, unspecified site: Secondary | ICD-10-CM | POA: Diagnosis not present

## 2023-03-02 DIAGNOSIS — I272 Pulmonary hypertension, unspecified: Secondary | ICD-10-CM | POA: Diagnosis present

## 2023-03-02 DIAGNOSIS — Z1152 Encounter for screening for COVID-19: Secondary | ICD-10-CM

## 2023-03-02 DIAGNOSIS — I361 Nonrheumatic tricuspid (valve) insufficiency: Secondary | ICD-10-CM | POA: Diagnosis not present

## 2023-03-02 DIAGNOSIS — Z681 Body mass index (BMI) 19 or less, adult: Secondary | ICD-10-CM

## 2023-03-02 DIAGNOSIS — Z79899 Other long term (current) drug therapy: Secondary | ICD-10-CM | POA: Diagnosis not present

## 2023-03-02 DIAGNOSIS — I509 Heart failure, unspecified: Secondary | ICD-10-CM

## 2023-03-02 DIAGNOSIS — J9601 Acute respiratory failure with hypoxia: Secondary | ICD-10-CM | POA: Diagnosis not present

## 2023-03-02 DIAGNOSIS — J479 Bronchiectasis, uncomplicated: Secondary | ICD-10-CM | POA: Diagnosis present

## 2023-03-02 DIAGNOSIS — I083 Combined rheumatic disorders of mitral, aortic and tricuspid valves: Secondary | ICD-10-CM | POA: Diagnosis not present

## 2023-03-02 DIAGNOSIS — I340 Nonrheumatic mitral (valve) insufficiency: Secondary | ICD-10-CM | POA: Diagnosis not present

## 2023-03-02 DIAGNOSIS — J90 Pleural effusion, not elsewhere classified: Secondary | ICD-10-CM | POA: Diagnosis not present

## 2023-03-02 DIAGNOSIS — I38 Endocarditis, valve unspecified: Secondary | ICD-10-CM | POA: Diagnosis not present

## 2023-03-02 DIAGNOSIS — I10 Essential (primary) hypertension: Secondary | ICD-10-CM | POA: Diagnosis not present

## 2023-03-02 DIAGNOSIS — I480 Paroxysmal atrial fibrillation: Secondary | ICD-10-CM | POA: Diagnosis present

## 2023-03-02 LAB — BASIC METABOLIC PANEL
Anion gap: 11 (ref 5–15)
BUN: 24 mg/dL — ABNORMAL HIGH (ref 8–23)
CO2: 24 mmol/L (ref 22–32)
Calcium: 10.4 mg/dL — ABNORMAL HIGH (ref 8.9–10.3)
Chloride: 101 mmol/L (ref 98–111)
Creatinine, Ser: 0.94 mg/dL (ref 0.44–1.00)
GFR, Estimated: 60 mL/min — ABNORMAL LOW (ref >60)
Glucose, Bld: 179 mg/dL — ABNORMAL HIGH (ref 70–99)
Potassium: 4.2 mmol/L (ref 3.5–5.1)
Sodium: 136 mmol/L (ref 135–145)

## 2023-03-02 LAB — CBC WITH DIFFERENTIAL/PLATELET
Abs Immature Granulocytes: 0.03 10*3/uL (ref 0.00–0.07)
Basophils Absolute: 0.1 10*3/uL (ref 0.0–0.1)
Basophils Relative: 1 %
Eosinophils Absolute: 0.1 10*3/uL (ref 0.0–0.5)
Eosinophils Relative: 1 %
HCT: 40.8 % (ref 36.0–46.0)
Hemoglobin: 14 g/dL (ref 12.0–15.0)
Immature Granulocytes: 0 %
Lymphocytes Relative: 14 %
Lymphs Abs: 1.3 10*3/uL (ref 0.7–4.0)
MCH: 31.3 pg (ref 26.0–34.0)
MCHC: 34.3 g/dL (ref 30.0–36.0)
MCV: 91.1 fL (ref 80.0–100.0)
Monocytes Absolute: 0.6 10*3/uL (ref 0.1–1.0)
Monocytes Relative: 7 %
Neutro Abs: 7.1 10*3/uL (ref 1.7–7.7)
Neutrophils Relative %: 77 %
Platelets: 226 10*3/uL (ref 150–400)
RBC: 4.48 MIL/uL (ref 3.87–5.11)
RDW: 13.5 % (ref 11.5–15.5)
WBC: 9.2 10*3/uL (ref 4.0–10.5)
nRBC: 0 % (ref 0.0–0.2)

## 2023-03-02 LAB — RESP PANEL BY RT-PCR (RSV, FLU A&B, COVID)  RVPGX2
Influenza A by PCR: NEGATIVE
Influenza B by PCR: NEGATIVE
Resp Syncytial Virus by PCR: NEGATIVE
SARS Coronavirus 2 by RT PCR: NEGATIVE

## 2023-03-02 LAB — TROPONIN I (HIGH SENSITIVITY): Troponin I (High Sensitivity): 8 ng/L (ref <18)

## 2023-03-02 LAB — D-DIMER, QUANTITATIVE: D-Dimer, Quant: 1.6 ug/mL-FEU — ABNORMAL HIGH (ref 0.00–0.50)

## 2023-03-02 LAB — BRAIN NATRIURETIC PEPTIDE: B Natriuretic Peptide: 894 pg/mL — ABNORMAL HIGH (ref 0.0–100.0)

## 2023-03-02 MED ORDER — APIXABAN 2.5 MG PO TABS
2.5000 mg | ORAL_TABLET | Freq: Two times a day (BID) | ORAL | Status: DC
  Administered 2023-03-02 – 2023-03-06 (×8): 2.5 mg via ORAL
  Filled 2023-03-02 (×8): qty 1

## 2023-03-02 MED ORDER — DILTIAZEM LOAD VIA INFUSION
10.0000 mg | Freq: Once | INTRAVENOUS | Status: AC
  Administered 2023-03-02: 10 mg via INTRAVENOUS
  Filled 2023-03-02: qty 10

## 2023-03-02 MED ORDER — DILTIAZEM HCL-DEXTROSE 125-5 MG/125ML-% IV SOLN (PREMIX)
5.0000 mg/h | INTRAVENOUS | Status: DC
  Administered 2023-03-02 – 2023-03-04 (×3): 10 mg/h via INTRAVENOUS
  Filled 2023-03-02 (×3): qty 125

## 2023-03-02 MED ORDER — FUROSEMIDE 10 MG/ML IJ SOLN
40.0000 mg | Freq: Once | INTRAMUSCULAR | Status: AC
  Administered 2023-03-02: 40 mg via INTRAVENOUS
  Filled 2023-03-02: qty 4

## 2023-03-02 MED ORDER — IOHEXOL 350 MG/ML SOLN
100.0000 mL | Freq: Once | INTRAVENOUS | Status: AC | PRN
  Administered 2023-03-02: 80 mL via INTRAVENOUS

## 2023-03-02 NOTE — ED Notes (Signed)
Verbal consent for transfer to [facility] for admission from patient.

## 2023-03-02 NOTE — Progress Notes (Addendum)
Plan of Care Note for accepted transfer   Patient: Sarah Lewis MRN: [id number]   DOA: 03/02/2023  Facility requesting transfer: [facility]  Requesting Provider: Lennice Sites, DO  Reason for transfer: New onset atrial fibrillation with RVR and acute CHF. Facility course:  [name] is a 87 y.o. female with history of hypertension and RA, coming with shortness of breath for the last 2 days or so.  History of interstitial lung disease, breast cancer history, arctic insufficiency.  She has been pretty short of breath the last 2 days.  Denies any chest pain but now having some pain to the right shoulder.  No recent surgery or travel.  Nothing makes it worse or better.  She denies any fevers or chills.  When she came to the ER, BP was 122/91 with heart rate of 148 and respiratory rate of 24 with pulse currently of 89% on room air and up to 96% on O2 by nasal cannula.  BMP was remarkable for a blood glucose of 179 and a BUN of 24, calcium 10.4.  CBC was normal.  BNP was elevated at 894 and high sensitive troponin I was normal at 8.  D-dimer was elevated at 1.6.  Portable chest x-ray showed right lower lobe airspace opacity with small right pleural effusion that could reflect pneumonia.  Chest CTA showed evidence for PE but did show moderate right and small left pleural effusions with associated atelectasis, cardiomegaly with findings suggestive of pulmonary hypertension.  EKG showed A-fib with RVR of 152.  The patient was given 10 mg of IV Cardizem bolus followed by drip, 2.5 mg of p.o. Eliquis and 40 mg of IV Lasix.  Plan of care: The patient is accepted for admission to Progressive unit, at [facility]   The patient will be under the care and responsibility of the ED physician until arrival to [facility].  Author: Christel Mormon, MD 03/02/2023  Check [email or web address] for on-call coverage.  Nursing staff, Please call [place] number on Amion as soon  as patient's arrival, so appropriate admitting provider can evaluate the pt.

## 2023-03-02 NOTE — ED Notes (Signed)
RN escorted patient to CT on cardiac monitor.

## 2023-03-02 NOTE — ED Provider Notes (Signed)
[facility] Provider Note   CSN: [id number] Arrival date & time: 03/02/23  1936     History  Chief Complaint  Patient presents with   Shortness of Breath    Sarah Lewis is a 87 y.o. female.  Patient here with shortness of breath for the last 2 days or so.  History of interstitial lung disease, breast cancer history, arctic insufficiency.  She has been pretty short of breath the last 2 days.  Denies any chest pain but now having some pain to the right shoulder.  No recent surgery or travel.  Nothing makes it worse or better.  She denies any fevers or chills.  The history is provided by the patient.       Home Medications Prior to Admission medications   Medication Sig Start Date End Date Taking? Authorizing Provider  albuterol (PROVENTIL) (2.5 MG/3ML) 0.083% nebulizer solution Take 3 mLs (2.5 mg total) by nebulization every 6 (six) hours as needed for wheezing or shortness of breath. 09/19/20   Icard, Leory Plowman L, DO  amLODipine (NORVASC) 2.5 MG tablet TAKE 1 TABLET DAILY. PATIENT NEEDS TO SCHEDULE AN APPOINTMENT TO RECEIVE FUTURE REFILLS. 12/05/22   Lelon Perla, MD  Calcium Carb-Cholecalciferol (CALCIUM-VITAMIN D) 500-400 MG-UNIT TABS Take 1 tablet by mouth 2 (two) times daily.    [provider]  Cholecalciferol (VITAMIN D-3) 25 MCG (1000 UT) CAPS Take 1 capsule by mouth daily.    [provider]  ipratropium (ATROVENT) 0.02 % nebulizer solution TAKE 2.5 MLS BY NEBULIZATION EVERY 6 (SIX) HOURS AS NEEDED FOR WHEEZING OR SHORTNESS OF BREATH. 11/29/22   Icard, Leory Plowman L, DO  metoprolol succinate (TOPROL-XL) 100 MG 24 hr tablet Take 100 mg by mouth daily. Take with or immediately following a meal.    [provider]      Allergies    Patient has no known allergies.    Review of Systems   Review of Systems  Physical Exam Updated Vital Signs BP 124/60   Pulse (!) 104   Temp 98.7 F (37.1 C)   Resp  (!) 31   SpO2 92%  Physical Exam Vitals and nursing note reviewed.  Constitutional:      General: She is in acute distress.     Appearance: She is well-developed. She is not ill-appearing.  HENT:     Head: Normocephalic and atraumatic.  Eyes:     Extraocular Movements: Extraocular movements intact.     Conjunctiva/sclera: Conjunctivae normal.     Pupils: Pupils are equal, round, and reactive to light.  Cardiovascular:     Rate and Rhythm: Tachycardia present. Rhythm irregular.     Pulses: Normal pulses.     Heart sounds: No murmur heard. Pulmonary:     Effort: Pulmonary effort is normal. Tachypnea present. No respiratory distress.     Breath sounds: Decreased breath sounds present.  Abdominal:     Palpations: Abdomen is soft.     Tenderness: There is no abdominal tenderness.  Musculoskeletal:        General: No swelling. Normal range of motion.     Cervical back: Normal range of motion and neck supple.     Right lower leg: No edema.     Left lower leg: No edema.  Skin:    General: Skin is warm and dry.     Capillary Refill: Capillary refill takes less than 2 seconds.  Neurological:     Mental Status: She is alert.  Psychiatric:        Mood and Affect: Mood normal.     ED Results / Procedures / Treatments   Labs (all labs ordered are listed, but only abnormal results are displayed) Labs Reviewed  BASIC METABOLIC PANEL - Abnormal; Notable for the following components:      Result Value   Glucose, Bld 179 (*)    BUN 24 (*)    Calcium 10.4 (*)    GFR, Estimated 60 (*)    All other components within normal limits  BRAIN NATRIURETIC PEPTIDE - Abnormal; Notable for the following components:   B Natriuretic Peptide 894.0 (*)    All other components within normal limits  D-DIMER, QUANTITATIVE - Abnormal; Notable for the following components:   D-Dimer, Quant 1.60 (*)    All other components within normal limits  RESP PANEL BY RT-PCR (RSV, FLU A&B, COVID)  [id number]  CBC  WITH DIFFERENTIAL/PLATELET  TROPONIN I (HIGH SENSITIVITY)    EKG None  Radiology DG Chest Portable 1 View  Result Date: 03/02/2023 CLINICAL DATA:  Cough, shortness of breath EXAM: PORTABLE CHEST 1 VIEW COMPARISON:  10/12/2015 FINDINGS: There is hyperinflation of the lungs compatible with COPD. Cardiomegaly, aortic atherosclerosis. Airspace opacity in the right lung base with small right pleural effusion. No confluent opacity on the left. Biapical scarring. No acute bony abnormality. IMPRESSION: COPD/chronic changes. Cardiomegaly. Right lower lobe airspace opacity with small right effusion. This could reflect pneumonia. Electronically Signed   By: Rolm Baptise M.D.   On: 03/02/2023 20:10    Procedures .Critical Care  Performed by: Lennice Sites, DO Authorized by: Lennice Sites, DO   Critical care provider statement:    Critical care time (minutes):  45   Critical care was necessary to treat or prevent imminent or life-threatening deterioration of the following conditions: atrial fibrillation with rvr.   Critical care was time spent personally by me on the following activities:  Blood draw for specimens, development of treatment plan with patient or surrogate, discussions with consultants, examination of patient, evaluation of patient's response to treatment, obtaining history from patient or surrogate, ordering and performing treatments and interventions, ordering and review of radiographic studies, ordering and review of laboratory studies, pulse oximetry, re-evaluation of patient's condition and review of old charts   I assumed direction of critical care for this patient from another provider in my specialty: no       Medications Ordered in ED Medications  diltiazem (CARDIZEM) 1 mg/mL load via infusion 10 mg (10 mg Intravenous Bolus from Bag 03/02/23 2008)    And  diltiazem (CARDIZEM) 125 mg in dextrose 5% 125 mL (1 mg/mL) infusion (5 mg/hr Intravenous Rate/Dose Change 03/02/23 2016)   furosemide (LASIX) injection 40 mg (has no administration in time range)  apixaban (ELIQUIS) tablet 2.5 mg (has no administration in time range)  iohexol (OMNIPAQUE) 350 MG/ML injection 100 mL (80 mLs Intravenous Contrast Given 03/02/23 2112)    ED Course/ Medical Decision Making/ A&P                             Medical Decision Making Amount and/or Complexity of Data Reviewed Labs: ordered. Radiology: ordered.  Risk Prescription drug management. Decision regarding hospitalization.   LIBNI BUTTACAVOLI is here with shortness of breath.  She arrives with heart rate in the 150s, EKG consistent with atrial fibrillation with RVR.  She does not have history of the same.  She has not  noticed palpitations.  She has felt short of breath since yesterday morning.  She has some chronic lung disease she states and follows with pulmonology.  She denies any fevers or chills.  She also arrives hypoxic in the mid 80s.  I do not hear obvious wheezing on exam.  No obvious volume overload on exam.  Breast cancer history in the past.  No other PE risk factors.  Overall differential could be new A-fib secondary to infectious process or PE, seems less likely to be heart failure or ACS.  Will start her on diltiazem bolus and infusion.  Will get CBC, BMP, troponin, BNP, D-dimer, chest x-ray.  Hypoxia is new.  Per my review and interpretation of labs BNP elevated to about 900.  Troponin within normal limits.  Chest x-ray may be with pleural effusion/pneumonia.  D-dimer was elevated therefore CT scan of chest was ordered.  This showed per radiology report no PE but did show atelectasis/pleural effusion on the right.  Suspect that this is driven from demand from A-fib with RVR.  Patient was started on IV diltiazem bolus and infusion.  Heart rate has stabilized in the low 100s after heart rate was in the 150s.  She still short of breath.  Still hypoxic.  Will give her dose IV Lasix and admitted to medicine for further care.   Lab work otherwise unremarkable.  This chart was dictated using voice recognition software.  Despite best efforts to proofread,  errors can occur which can change the documentation meaning.         Final Clinical Impression(s) / ED Diagnoses Final diagnoses:  Atrial fibrillation with RVR (HCC)  Pleural effusion  Acute respiratory failure with hypoxia [facility])    Rx / DC Orders ED Discharge Orders     None         Lennice Sites, DO 03/02/23 2149

## 2023-03-02 NOTE — Progress Notes (Signed)
Called to triage to assess patient for c/o sob. Patient tachypneic and tachycardic. BBS CTA. EKG in process. Room air SpO2 95%. Patient speaking in full sentences.

## 2023-03-03 ENCOUNTER — Inpatient Hospital Stay (HOSPITAL_COMMUNITY): Payer: Medicare PPO

## 2023-03-03 DIAGNOSIS — I4891 Unspecified atrial fibrillation: Secondary | ICD-10-CM

## 2023-03-03 DIAGNOSIS — M810 Age-related osteoporosis without current pathological fracture: Secondary | ICD-10-CM

## 2023-03-03 DIAGNOSIS — I509 Heart failure, unspecified: Secondary | ICD-10-CM | POA: Diagnosis not present

## 2023-03-03 DIAGNOSIS — I5031 Acute diastolic (congestive) heart failure: Secondary | ICD-10-CM

## 2023-03-03 DIAGNOSIS — M069 Rheumatoid arthritis, unspecified: Secondary | ICD-10-CM

## 2023-03-03 DIAGNOSIS — J9601 Acute respiratory failure with hypoxia: Secondary | ICD-10-CM

## 2023-03-03 DIAGNOSIS — C50511 Malignant neoplasm of lower-outer quadrant of right female breast: Secondary | ICD-10-CM

## 2023-03-03 LAB — RENAL FUNCTION PANEL
Albumin: 3.7 g/dL (ref 3.5–5.0)
Anion gap: 10 (ref 5–15)
BUN: 20 mg/dL (ref 8–23)
CO2: 25 mmol/L (ref 22–32)
Calcium: 9.6 mg/dL (ref 8.9–10.3)
Chloride: 101 mmol/L (ref 98–111)
Creatinine, Ser: 0.9 mg/dL (ref 0.44–1.00)
GFR, Estimated: >60 mL/min (ref >60)
Glucose, Bld: 121 mg/dL — ABNORMAL HIGH (ref 70–99)
Phosphorus: 5.1 mg/dL — ABNORMAL HIGH (ref 2.5–4.6)
Potassium: 3.7 mmol/L (ref 3.5–5.1)
Sodium: 136 mmol/L (ref 135–145)

## 2023-03-03 LAB — TSH: TSH: 2.094 u[IU]/mL (ref 0.350–4.500)

## 2023-03-03 LAB — MAGNESIUM: Magnesium: 1.5 mg/dL — ABNORMAL LOW (ref 1.7–2.4)

## 2023-03-03 MED ORDER — MAGNESIUM SULFATE 2 GM/50ML IV SOLN
2.0000 g | Freq: Once | INTRAVENOUS | Status: AC
  Administered 2023-03-03: 2 g via INTRAVENOUS
  Filled 2023-03-03: qty 50

## 2023-03-03 MED ORDER — ACETAMINOPHEN 650 MG RE SUPP: 650.0000 mg | Freq: Four times a day (QID) | RECTAL | Status: DC | PRN

## 2023-03-03 MED ORDER — ACETAMINOPHEN 325 MG PO TABS: 650.0000 mg | ORAL_TABLET | Freq: Four times a day (QID) | ORAL | Status: DC | PRN

## 2023-03-03 MED ORDER — LEVALBUTEROL HCL 0.63 MG/3ML IN NEBU: 0.6300 mg | INHALATION_SOLUTION | Freq: Four times a day (QID) | RESPIRATORY_TRACT | Status: DC | PRN

## 2023-03-03 MED ORDER — FUROSEMIDE 40 MG PO TABS
40.0000 mg | ORAL_TABLET | Freq: Two times a day (BID) | ORAL | Status: DC
  Administered 2023-03-03 – 2023-03-04 (×3): 40 mg via ORAL
  Filled 2023-03-03 (×3): qty 1

## 2023-03-03 MED ORDER — METOPROLOL SUCCINATE ER 100 MG PO TB24
100.0000 mg | ORAL_TABLET | Freq: Every day | ORAL | Status: DC
  Administered 2023-03-03 – 2023-03-06 (×4): 100 mg via ORAL
  Filled 2023-03-03 (×4): qty 1

## 2023-03-03 MED ORDER — ONDANSETRON HCL 4 MG/2ML IJ SOLN: 4.0000 mg | Freq: Four times a day (QID) | INTRAMUSCULAR | Status: DC | PRN

## 2023-03-03 MED ORDER — POTASSIUM CHLORIDE CRYS ER 20 MEQ PO TBCR
40.0000 meq | EXTENDED_RELEASE_TABLET | Freq: Once | ORAL | Status: AC
  Administered 2023-03-03: 40 meq via ORAL
  Filled 2023-03-03: qty 2

## 2023-03-03 MED ORDER — ONDANSETRON HCL 4 MG PO TABS: 4.0000 mg | ORAL_TABLET | Freq: Four times a day (QID) | ORAL | Status: DC | PRN

## 2023-03-03 NOTE — H&P (Signed)
History and Physical    Sarah Lewis [id number] DOB: 18-Jul-1936 DOA: 03/02/2023  PCP: Carol Ada, MD  Patient coming from: [place]  I have personally briefly reviewed patient's old medical records in [place]  Chief Complaint: sob x2 days  HPI: Sarah Lewis is a 87 y.o. female with medical history significant of  hypertension,RA, aortic insufficiency, moderate to severe tricuspid regurg, grade II diastolic dysfunction , BRCA s/p b/l mastectomy ,s/p chemo, hx of mild bronchiectasis/interstitial lung disease, who presents to ED with 2 days of sob that is progressive. She notes symptoms so severe unable to perform her ADLS. Patient notes no chest pain , n/v/ abdominal pain /near syncope/ cough or uri sxs.  Patient currently states she feels improved  s/p treatment in the  ED,    ED Course:  In ED  Per prior MD: BP was 122/91 with heart rate of 148 and respiratory rate of 24 with pulse currently of 89% on room air and up to 96% on O2 by nasal cannula.   EKG noting: afib at 110  labs  Resp pane neg  BMP was remarkable for a blood glucose of 179 and a BUN of 24, calcium 10.4.  CBC was normal.  BNP was elevated at 894 and high sensitive troponin I was normal at 8.  D-dimer was elevated at 1.6.   Portable chest x-ray showed right lower lobe airspace opacity with small right pleural effusion that could reflect pneumonia.     Chest CTPE however showed no evidence for PE but did show moderate right and small left pleural effusions with associated atelectasis, cardiomegaly with findings suggestive of pulmonary hypertension.  Patient admitted with new Onset Afib with new on set heart failure.   Tx cardizem drip,lasix 40 mg , eliquis 2.5 mg   Review of Systems: As per HPI otherwise 10 point review of systems negative.   Past Medical History:  Diagnosis Date   Anemia yrs ago   Aortic insufficiency    Breast cancer of lower-outer quadrant of right female breast ([place])  10/10/2015   left breat also chemo done for first   Cough    from toporol   Heart murmur    Hypertension    Osteoporosis    Spinal headache yrs ago    Past Surgical History:  Procedure Laterality Date   ABDOMINAL HYSTERECTOMY     total   APPENDECTOMY     BREAST RECONSTRUCTION WITH PLACEMENT OF TISSUE EXPANDER AND FLEX HD (ACELLULAR HYDRATED DERMIS) Right 12/13/2015   Procedure: RIGHT BREAST RECONSTRUCTION WITH PLACEMENT OF silicone gel implant and allograft tissue;  Surgeon: Crissie Reese, MD;  Location: [facility];  Service: Plastics;  Laterality: Right;   BREAST SURGERY     CESAREAN SECTION     x 2   COLONOSCOPY WITH PROPOFOL N/A 04/08/2017   Procedure: COLONOSCOPY WITH PROPOFOL;  Surgeon: Garlan Fair, MD;  Location: [facility];  Service: Endoscopy;  Laterality: N/A;   HERNIA REPAIR     MASTECTOMY Left 1979   TONSILLECTOMY     TOTAL MASTECTOMY Right 12/13/2015   Procedure: RIGHT MASTECTOMY ;  Surgeon: Stark Klein, MD;  Location: [facility];  Service: General;  Laterality: Right;     reports that she has never smoked. She has never used smokeless tobacco. She reports that she does not drink alcohol and does not use drugs.  No Known Allergies  Family History  Problem Relation Age of Onset   Other Mother  malignant neoplasm   Bone cancer Father        malignant neoplasm   Other Sister        malignant neoplasm    Prior to Admission medications   Medication Sig Start Date End Date Taking? Authorizing Provider  albuterol (PROVENTIL) (2.5 MG/3ML) 0.083% nebulizer solution Take 3 mLs (2.5 mg total) by nebulization every 6 (six) hours as needed for wheezing or shortness of breath. 09/19/20   Icard, Leory Plowman L, DO  amLODipine (NORVASC) 2.5 MG tablet TAKE 1 TABLET DAILY. PATIENT NEEDS TO SCHEDULE AN APPOINTMENT TO RECEIVE FUTURE REFILLS. 12/05/22   Lelon Perla, MD  Calcium Carb-Cholecalciferol (CALCIUM-VITAMIN D) 500-400 MG-UNIT TABS Take 1 tablet by mouth 2 (two) times  daily.    [provider]  Cholecalciferol (VITAMIN D-3) 25 MCG (1000 UT) CAPS Take 1 capsule by mouth daily.    [provider]  ipratropium (ATROVENT) 0.02 % nebulizer solution TAKE 2.5 MLS BY NEBULIZATION EVERY 6 (SIX) HOURS AS NEEDED FOR WHEEZING OR SHORTNESS OF BREATH. 11/29/22   Icard, Leory Plowman L, DO  metoprolol succinate (TOPROL-XL) 100 MG 24 hr tablet Take 100 mg by mouth daily. Take with or immediately following a meal.    [provider]    Physical Exam: Vitals:   03/02/23 2045 03/02/23 2130 03/02/23 2245 03/02/23 2347  BP: 114/75 124/60 (!) 142/98 (!) 139/94  Pulse: 97 (!) 104 (!) 111 95  Resp: (!) 29 (!) 31 (!) 25 20  Temp:    (!) 97.5 F (36.4 C)  TempSrc:    Oral  SpO2: 93% 92% 92% 92%    Constitutional: NAD, calm, comfortable Vitals:   03/02/23 2045 03/02/23 2130 03/02/23 2245 03/02/23 2347  BP: 114/75 124/60 (!) 142/98 (!) 139/94  Pulse: 97 (!) 104 (!) 111 95  Resp: (!) 29 (!) 31 (!) 25 20  Temp:    (!) 97.5 F (36.4 C)  TempSrc:    Oral  SpO2: 93% 92% 92% 92%   Eyes: PERRL, lids and conjunctivae normal ENMT: Mucous membranes are moist. Posterior pharynx clear of any exudate or lesions.Normal dentition.  Neck: normal, supple, no masses, no thyromegaly Respiratory: decrease at based no crackles or rhonchi . Normal respiratory effort. No accessory muscle use.  Cardiovascular: irregular rhythm , no murmurs / rubs / gallops. No extremity edema. 2+ pedal pulses.  Abdomen: no tenderness, no masses palpated. No hepatosplenomegaly. Bowel sounds positive.  Musculoskeletal: no clubbing / cyanosis. No joint deformity upper and lower extremities. Good ROM, no contractures. Normal muscle tone.  Skin: no rashes, lesions, ulcers. No induration Neurologic: CN 2-12 grossly intact. Sensation intact,  Strength 5/5 in all 4.  Psychiatric: Normal judgment and insight. Alert and oriented x 3. Normal mood.    Labs on Admission: I have personally reviewed  following labs and imaging studies  CBC: Recent Labs  Lab 03/02/23 1957  WBC 9.2  NEUTROABS 7.1  HGB 14.0  HCT 40.8  MCV 91.1  PLT [id number]   Basic Metabolic Panel: Recent Labs  Lab 03/02/23 1957  NA 136  K 4.2  CL 101  CO2 24  GLUCOSE 179*  BUN 24*  CREATININE 0.94  CALCIUM 10.4*   GFR: CrCl cannot be calculated (Unknown ideal weight.). Liver Function Tests: No results for input(s): "AST", "ALT", "ALKPHOS", "BILITOT", "PROT", "ALBUMIN" in the last 168 hours. No results for input(s): "LIPASE", "AMYLASE" in the last 168 hours. No results for input(s): "AMMONIA" in the last 168 hours. Coagulation Profile: No results  for input(s): "INR", "PROTIME" in the last 168 hours. Cardiac Enzymes: No results for input(s): "CKTOTAL", "CKMB", "CKMBINDEX", "TROPONINI" in the last 168 hours. BNP (last 3 results) No results for input(s): "PROBNP" in the last 8760 hours. HbA1C: No results for input(s): "HGBA1C" in the last 72 hours. CBG: No results for input(s): "GLUCAP" in the last 168 hours. Lipid Profile: No results for input(s): "CHOL", "HDL", "LDLCALC", "TRIG", "CHOLHDL", "LDLDIRECT" in the last 72 hours. Thyroid Function Tests: No results for input(s): "TSH", "T4TOTAL", "FREET4", "T3FREE", "THYROIDAB" in the last 72 hours. Anemia Panel: No results for input(s): "VITAMINB12", "FOLATE", "FERRITIN", "TIBC", "IRON", "RETICCTPCT" in the last 72 hours. Urine analysis: No results found for: "COLORURINE", "APPEARANCEUR", "LABSPEC", "PHURINE", "GLUCOSEU", "HGBUR", "BILIRUBINUR", "KETONESUR", "PROTEINUR", "UROBILINOGEN", "NITRITE", "LEUKOCYTESUR"  Radiological Exams on Admission: CT Angio Chest PE W and/or Wo Contrast  Result Date: 03/02/2023 CLINICAL DATA:  Shortness of breath, tachypnea, tachycardia, concern for pulmonary embolism. EXAM: CT ANGIOGRAPHY CHEST WITH CONTRAST TECHNIQUE: Multidetector CT imaging of the chest was performed using the standard protocol during bolus administration  of intravenous contrast. Multiplanar CT image reconstructions and MIPs were obtained to evaluate the vascular anatomy. RADIATION DOSE REDUCTION: This exam was performed according to the departmental dose-optimization program which includes automated exposure control, adjustment of the mA and/or kV according to patient size and/or use of iterative reconstruction technique. CONTRAST:  45mL OMNIPAQUE IOHEXOL 350 MG/ML SOLN COMPARISON:  Same day chest radiograph and CT chest dated 01/23/2019. FINDINGS: Cardiovascular: Satisfactory opacification of the pulmonary arteries to the segmental level. No evidence of pulmonary embolism. The right and left pulmonary arteries are enlarged, measuring 2.6 cm and 2.5 cm respectively, suggestive of pulmonary hypertension. Vascular calcifications are seen in the coronary arteries and aortic arch. The heart is enlarged. No pericardial effusion. Mediastinum/Nodes: No enlarged mediastinal, hilar, or axillary lymph nodes. Thyroid gland, trachea, and esophagus demonstrate no significant findings. Bilateral breast implants are noted. Lungs/Pleura: There is a moderate right and small left pleural effusion with associated atelectasis. There is mild atelectasis/scarring in the right middle lobe and the lingula. There is no pneumothorax. Upper Abdomen: No acute abnormality. Musculoskeletal: No chest wall abnormality. No acute or significant osseous findings. Review of the MIP images confirms the above findings. IMPRESSION: 1. No evidence of pulmonary embolism. 2. Moderate right and small left pleural effusions with associated atelectasis. 3. Cardiomegaly and findings suggestive of pulmonary hypertension. Aortic Atherosclerosis (ICD10-I70.0). Electronically Signed   By: Zerita Boers M.D.   On: 03/02/2023 21:41   DG Chest Portable 1 View  Result Date: 03/02/2023 CLINICAL DATA:  Cough, shortness of breath EXAM: PORTABLE CHEST 1 VIEW COMPARISON:  10/12/2015 FINDINGS: There is hyperinflation of  the lungs compatible with COPD. Cardiomegaly, aortic atherosclerosis. Airspace opacity in the right lung base with small right pleural effusion. No confluent opacity on the left. Biapical scarring. No acute bony abnormality. IMPRESSION: COPD/chronic changes. Cardiomegaly. Right lower lobe airspace opacity with small right effusion. This could reflect pneumonia. Electronically Signed   By: Rolm Baptise M.D.   On: 03/02/2023 20:10    EKG: Independently reviewed.   Assessment/Plan  New onset Atrial fibrillation  -insetting of valvular disease, grade II diastolic dysfunction  -continue on diltiazem drip  -resume metoprolol  -wean drip as able  -continue on Eliquis started in ED -echo pending  - cardiology consult    New onset CHF with hypoxic respiratory failure  -in setting of Afib rvr  -hx of echo with noted grade II diastolic dysfunction  - place on chf  protocol  -wean O2 as able  -imaging noted pleural effusion and pulmonary edema - lasix iv bid  -echo in am  -await cardiology final recs    Hypertension  -resume amlodipine/metoprolol  RA  -no active flare currently   Mild Bronchiectasis  Interstitial lung disease  - followed by pulmonary out patient   Valvular disease Aortic insufficiency, moderate to severe TR regurg - echo pending  -f/u with cardiology final rec   BRCA  -s/p b/l mastectomy /chemo  -in remissio n    DVT prophylaxis: apixiban  Code Status:  full/ as discussed per patient wishes in event of cardiac arrest  Family Communication:  none at bedside Disposition Plan: patient  expected to be admitted greater than 2 midnights  Consults called: cardiology  Admission status: progressive    Clance Boll MD [facility]   If 7PM-7AM, please contact night-coverage [email or web address] Password [facility]  03/03/2023, 12:04 AM

## 2023-03-03 NOTE — Consult Note (Signed)
Cardiology Consultation:   Patient ID: Sarah Lewis MRN: [id number]; DOB: 07/11/1936  Admit date: 03/02/2023 Date of Consult: 03/03/2023  Primary Care Provider: Carol Ada, MD [facility] Cardiologist: [place] Electrophysiologist:  None    Patient Profile:   Sarah Lewis is a 87 y.o. female with PMHx HTN, reported mild ILD, BRCA s/p BiL mastectomy (2016), pulmonary hypertension, and valvular heart disease including moderate-severe TR, moderate aortic regurgitation, bileaflet MVP with trace MR who is being seen today for the evaluation of AFRVR and volume overload at the request of Hospital Medicine.  History of Present Illness:   Sarah Lewis presented to [facility] ED on 3/16 for further evaluation of SOB x2 days. Patient states that her shortness of breath has progressively worsened, now limiting ability to perform ADLs. Denies any accompanying CP, palpitations, LH/dizziness, orthopnea/PND, falls, diaphoresis, cough or URI symptoms. She denies any recent sick contacts. Denies any recent travel. She is active at baseline, walking daily.  On arrival to [facility] ED, patient was afebrile, hemodynamically stable (BP [id number] systolic, HR 123456), tachypneic to 30, satting 89% on room air, which corrected to mid-90s with 2L [place]. Evaluation in ED included the following: - CBC WNL, normal diff - BMP with normal Cr, normal K - mBNP 894 - hsTnT 8 - eRVP negative - EKG with AFRVR, LAFB (baseline), age-indeterminate anterior infarct. - CTPE with no e/p PE. Enlarged PA c/w known moderate pHTN. Moderate R, small L sided effusion.   While in ED patient was started on diltiazem gtt, received 40mg  IV lasix, and was started on reduced dose apixaban. Admitted to [place]. Cardiology consulted for recommendations regarding further management of AFRVR and diastolic dysfunction.    Past Medical History:  Diagnosis Date   Anemia yrs ago   Aortic insufficiency     Breast cancer of lower-outer quadrant of right female breast ([place]) 10/10/2015   left breat also chemo done for first   Cough    from toporol   Heart murmur    Hypertension    Osteoporosis    Spinal headache yrs ago    Past Surgical History:  Procedure Laterality Date   ABDOMINAL HYSTERECTOMY     total   APPENDECTOMY     BREAST RECONSTRUCTION WITH PLACEMENT OF TISSUE EXPANDER AND FLEX HD (ACELLULAR HYDRATED DERMIS) Right 12/13/2015   Procedure: RIGHT BREAST RECONSTRUCTION WITH PLACEMENT OF silicone gel implant and allograft tissue;  Surgeon: Crissie Reese, MD;  Location: [place];  Service: Plastics;  Laterality: Right;   BREAST SURGERY     CESAREAN SECTION     x 2   COLONOSCOPY WITH PROPOFOL N/A 04/08/2017   Procedure: COLONOSCOPY WITH PROPOFOL;  Surgeon: Garlan Fair, MD;  Location: [facility];  Service: Endoscopy;  Laterality: N/A;   HERNIA REPAIR     MASTECTOMY Left 1979   TONSILLECTOMY     TOTAL MASTECTOMY Right 12/13/2015   Procedure: RIGHT MASTECTOMY ;  Surgeon: Stark Klein, MD;  Location: [place];  Service: General;  Laterality: Right;     Home Medications:  Prior to Admission medications   Medication Sig Start Date End Date Taking? Authorizing Provider  albuterol (PROVENTIL) (2.5 MG/3ML) 0.083% nebulizer solution Take 3 mLs (2.5 mg total) by nebulization every 6 (six) hours as needed for wheezing or shortness of breath. 09/19/20   Icard, Leory Plowman L, DO  amLODipine (NORVASC) 2.5 MG tablet TAKE 1 TABLET DAILY. PATIENT NEEDS TO SCHEDULE AN APPOINTMENT TO RECEIVE FUTURE REFILLS. 12/05/22   Crenshaw,  Denice Bors, MD  Calcium Carb-Cholecalciferol (CALCIUM-VITAMIN D) 500-400 MG-UNIT TABS Take 1 tablet by mouth 2 (two) times daily.    [provider]  Cholecalciferol (VITAMIN D-3) 25 MCG (1000 UT) CAPS Take 1 capsule by mouth daily.    [provider]  ipratropium (ATROVENT) 0.02 % nebulizer solution TAKE 2.5 MLS BY NEBULIZATION EVERY 6 (SIX) HOURS AS NEEDED FOR  WHEEZING OR SHORTNESS OF BREATH. 11/29/22   Icard, Leory Plowman L, DO  metoprolol succinate (TOPROL-XL) 100 MG 24 hr tablet Take 100 mg by mouth daily. Take with or immediately following a meal.    [provider]    Inpatient Medications: Scheduled Meds:  apixaban  2.5 mg Oral BID   furosemide  40 mg Oral BID   metoprolol succinate  100 mg Oral Daily   Continuous Infusions:  diltiazem (CARDIZEM) infusion 5 mg/hr (03/02/23 2016)   PRN Meds: acetaminophen **OR** acetaminophen, levalbuterol, ondansetron **OR** ondansetron (ZOFRAN) IV  Allergies:   No Known Allergies  Social History:   Social History   Socioeconomic History   Marital status: Married    Spouse name: Not on file   Number of children: 2   Years of education: Not on file   Highest education level: Not on file  Occupational History   Not on file  Tobacco Use   Smoking status: Never   Smokeless tobacco: Never  Vaping Use   Vaping Use: Never used  Substance and Sexual Activity   Alcohol use: No   Drug use: No   Sexual activity: Not on file  Other Topics Concern   Not on file  Social History Narrative   Not on file   Social Determinants of Health   Financial Resource Strain: Not on file  Food Insecurity: Not on file  Transportation Needs: Not on file  Physical Activity: Not on file  Stress: Not on file  Social Connections: Not on file  Intimate Partner Violence: Not on file    Family History:   Family History  Problem Relation Age of Onset   Other Mother        malignant neoplasm   Bone cancer Father        malignant neoplasm   Other Sister        malignant neoplasm     Physical Exam/Data:   Vitals:   03/03/23 0034 03/03/23 0040 03/03/23 0100 03/03/23 0300  BP: (!) 140/95 (!) 140/95 135/85 125/74  Pulse: (!) 102 91  94  Resp: 20 18  18   Temp: 97.7 F (36.5 C) 97.7 F (36.5 C)  98 F (36.7 C)  TempSrc: Oral   Oral  SpO2: 91% 93%  90%    Intake/Output Summary (Last 24 hours) at  03/03/2023 0512 Last data filed at 03/03/2023 0331 Gross per 24 hour  Intake --  Output 1400 ml  Net -1400 ml      04/13/2022    8:10 AM 10/30/2021    3:41 PM 10/18/2020    4:02 PM  Last 3 Weights  Weight (lbs) 92 lb 92 lb 6.4 oz 90 lb 12.8 oz  Weight (kg) 41.731 kg 41.912 kg 41.187 kg     There is no height or weight on file to calculate BMI.  General:  NAD Lymph: no adenopathy Neck: JVP visualized mid-neck with HOB 40 degrees.  Cardiac:  tachycardic, irregularly irregular.  Lungs:  Bibasilar inspiratory crackles throughout posterior lung fields. Confluent B-lines at lung bases bilaterally. Moderate pleural effusion R lung base.  Abd:  soft, nontender, no hepatomegaly  Ext: no edema, WWP. Musculoskeletal:  No deformities, BUE and BLE strength normal and equal Skin: warm and dry  Neuro:  CNs 2-12 intact, no focal abnormalities noted Psych:  Normal affect   EKG:  The EKG was personally reviewed and demonstrates:  Atrial fibrillation, LAFB, age-indeterminate anterior infarct.   Relevant CV Studies: 04/30/22 TTE: 1. Left ventricular ejection fraction, by estimation, is 55 to 60%. The  left ventricle has normal function. The left ventricle has no regional  wall motion abnormalities. Left ventricular diastolic parameters are  consistent with Grade II diastolic  dysfunction (pseudonormalization).   2. Right ventricular systolic function is normal. The right ventricular  size is moderately enlarged. There is mildly elevated pulmonary artery  systolic pressure. The estimated right ventricular systolic pressure is  123456 mmHg.   3. Left atrial size was mildly dilated.   4. Right atrial size was mildly dilated.   5. The mitral valve is grossly normal. Mild mitral valve regurgitation.  No evidence of mitral stenosis.   6. The tricuspid valve is abnormal. Tricuspid valve regurgitation is  moderate to severe.   7. The aortic valve is calcified. Aortic valve regurgitation is moderate.    8. The inferior vena cava is normal in size with greater than 50%  respiratory variability, suggesting right atrial pressure of 3 mmHg.   Comparison(s): Changes from prior study are noted. EF unchanged. AI is now  moderate. TR still moderate to severe.   Laboratory Data:  High Sensitivity Troponin:   Recent Labs  Lab 03/02/23 1957  TROPONINIHS 8     Chemistry Recent Labs  Lab 03/02/23 1957  NA 136  K 4.2  CL 101  CO2 24  GLUCOSE 179*  BUN 24*  CREATININE 0.94  CALCIUM 10.4*  GFRNONAA 60*  ANIONGAP 11    No results for input(s): "PROT", "ALBUMIN", "AST", "ALT", "ALKPHOS", "BILITOT" in the last 168 hours. Hematology Recent Labs  Lab 03/02/23 1957  WBC 9.2  RBC 4.48  HGB 14.0  HCT 40.8  MCV 91.1  MCH 31.3  MCHC 34.3  RDW 13.5  PLT 226   BNP Recent Labs  Lab 03/02/23 1957  BNP 894.0*    DDimer  Recent Labs  Lab 03/02/23 1957  DDIMER 1.60*    Radiology/Studies:  CT Angio Chest PE W and/or Wo Contrast  Result Date: 03/02/2023 CLINICAL DATA:  Shortness of breath, tachypnea, tachycardia, concern for pulmonary embolism. EXAM: CT ANGIOGRAPHY CHEST WITH CONTRAST TECHNIQUE: Multidetector CT imaging of the chest was performed using the standard protocol during bolus administration of intravenous contrast. Multiplanar CT image reconstructions and MIPs were obtained to evaluate the vascular anatomy. RADIATION DOSE REDUCTION: This exam was performed according to the departmental dose-optimization program which includes automated exposure control, adjustment of the mA and/or kV according to patient size and/or use of iterative reconstruction technique. CONTRAST:  35mL OMNIPAQUE IOHEXOL 350 MG/ML SOLN COMPARISON:  Same day chest radiograph and CT chest dated 01/23/2019. FINDINGS: Cardiovascular: Satisfactory opacification of the pulmonary arteries to the segmental level. No evidence of pulmonary embolism. The right and left pulmonary arteries are enlarged, measuring 2.6 cm  and 2.5 cm respectively, suggestive of pulmonary hypertension. Vascular calcifications are seen in the coronary arteries and aortic arch. The heart is enlarged. No pericardial effusion. Mediastinum/Nodes: No enlarged mediastinal, hilar, or axillary lymph nodes. Thyroid gland, trachea, and esophagus demonstrate no significant findings. Bilateral breast implants are noted. Lungs/Pleura: There is a moderate right and small left pleural  effusion with associated atelectasis. There is mild atelectasis/scarring in the right middle lobe and the lingula. There is no pneumothorax. Upper Abdomen: No acute abnormality. Musculoskeletal: No chest wall abnormality. No acute or significant osseous findings. Review of the MIP images confirms the above findings. IMPRESSION: 1. No evidence of pulmonary embolism. 2. Moderate right and small left pleural effusions with associated atelectasis. 3. Cardiomegaly and findings suggestive of pulmonary hypertension. Aortic Atherosclerosis (ICD10-I70.0). Electronically Signed   By: Zerita Boers M.D.   On: 03/02/2023 21:41   DG Chest Portable 1 View  Result Date: 03/02/2023 CLINICAL DATA:  Cough, shortness of breath EXAM: PORTABLE CHEST 1 VIEW COMPARISON:  10/12/2015 FINDINGS: There is hyperinflation of the lungs compatible with COPD. Cardiomegaly, aortic atherosclerosis. Airspace opacity in the right lung base with small right pleural effusion. No confluent opacity on the left. Biapical scarring. No acute bony abnormality. IMPRESSION: COPD/chronic changes. Cardiomegaly. Right lower lobe airspace opacity with small right effusion. This could reflect pneumonia. Electronically Signed   By: Rolm Baptise M.D.   On: 03/02/2023 20:10   {   Assessment and Plan:   #Atrial Fibrillation with RVR #Volume Overload Patient has no prior history of atrial fibrillation. Suspect current symptoms and volume overload are reflective of new onset AF superimposed on chronic ILD, diastolic dysfunction, and  pulmonary hypertension. This patients CHA2DS2-VASc Score and unadjusted Ischemic Stroke Rate (% per year) is equal to 4.8 % stroke rate/year from a score of 4 (elderly female patient with HTN).  - Continue telemetry monitoring. - Please make NPO for possible TEE/DCCV given new, symptomatic atrial fibrillation. OK to continue diltiazem gtt in the interim. Patient is currently rate controlled with HR 100-110.  - Anticoagulation is indicated (C2V=4). Recommend reduced dose apixaban 2.5mg  BID (age, <60kg). Patient has no history of bleeding.  - Please check Mg, maintain K>4, Mg>2 - Repeat formal TTE. - Diuresis with 40mg  IV lasix. TBB goal -1 to -2L.    For questions or updates, please contact [place] Please consult [email or web address] for contact info under    Signed, Delorse Limber, MD  03/03/2023 5:12 AM

## 2023-03-03 NOTE — Progress Notes (Signed)
PROGRESS NOTE  Sarah Lewis [id number] DOB: 11/09/1936   PCP: Carol Ada, MD  Patient is from: Home.  Independently ambulates at baseline.  DOA: 03/02/2023 LOS: 1  Chief complaints Chief Complaint  Patient presents with   Shortness of Breath     Brief Narrative / Interim history: 87 year old F with PMH of diastolic CHF (G2 DD), mod-sev TVR, aortic insufficiency, BRCA s/p b/l mastectomy and chemo, ILD and bronchiectasis presenting with increased shortness of breath that did not improve with bronchodilators as usual, and admitted with new onset atrial fibrillation with RVR.  HR elevated to 148.  BNP 894.  Negative troponin.  D-dimer 1.6.  CXR concerning for RLL air opacity with small right pleural effusion.  CTA chest negative for PE but moderate right and small left pleural effusion with associated atelectasis, cardiomegaly and finding suggesting PAH.  Patient was started on Cardizem drip, low-dose Eliquis and IV Lasix.  Cardiology consulted    Subjective: Seen and examined earlier this morning.  No major events overnight of this morning.  Feels much better from breathing standpoint.  Denies chest pain, palpitation, GI or UTI symptoms.  Now saturating in low 90s on 2 L by nasal cannula.  Remains in A-fib with mild RVR.  Objective: Vitals:   03/03/23 0700 03/03/23 0848 03/03/23 0900 03/03/23 1000  BP: 118/88 119/69 134/84 114/81  Pulse: 89 98 (!) 102 65  Resp:  18    Temp:  98 F (36.7 C)    TempSrc:  Oral    SpO2: 90% 92% 90% 92%  Weight:        Examination:  GENERAL: Appears frail.  No apparent distress. HEENT: MMM.  Vision and hearing grossly intact.  NECK: Supple.  No apparent JVD.  RESP:  No IWOB.  Diminished aeration bilaterally. CVS: Irregular rhythm.  HR in 110s.  Heart sounds normal.  ABD/GI/GU: BS+. Abd soft, NTND.  MSK/EXT:  Moves extremities. No apparent deformity. No edema.  No calf tenderness. SKIN: no apparent skin lesion or wound NEURO: Awake,  alert and oriented appropriately.  No apparent focal neuro deficit. PSYCH: Calm. Normal affect.   Procedures:  None  Microbiology summarized: [id number], influenza and RSV PCR nonreactive  Assessment and plan: Principal Problem:   Atrial fibrillation with rapid ventricular response (HCC) Active Problems:   Rheumatoid arthritis ([place])   Malignant neoplasm of lower-outer quadrant of right female breast ([place])   Osteoporosis   Acute heart failure with preserved ejection fraction (HCC)   Pleural effusion due to CHF (congestive heart failure) ([place])   New onset atrial fibrillation ([place])  New onset Atrial fibrillation with RVR: Unclear etiology but suspect CHF exacerbation.  She has history of valvular disease with mod to sev TVR and mod AVR on hide echo in 04/2022.  She is not on diuretics at home.  She is also on albuterol which could trigger arrhythmia.  TSH normal.  RVR improved but remains in A-fib. -Continue Cardizem drip, Toprol-XL and low-dose Eliquis -Optimize electrolytes-IV magnesium sulfate 2 g x 1 -Cardiology on board.  TEE cardioversion?[name] Kitchen  Acute HFpEF: TTE in 04/2022 with LVEF of 55 to 60%, G2 DD, mod AVR, mod-sev TVR and RVSP of 41.4 mmHg.  Appears euvolemic on exam but imaging with moderate right and small left pleural effusion.  BNP elevated to 900.  She is not on diuretics at home.  Started on IV Lasix 40 mg twice daily.  Had about 1.4 L UOP overnight. Cr stable. -Continue IV Lasix 40 mg twice  daily -Follow echocardiogram -Strict intake and output, daily weight, renal functions and electrolytes  Acute respiratory failure with hypoxia: Desaturated to 88% on RA and started on 2 L.  Likely due to the above -Management as above -Wean off oxygen as able -Xopenex as needed -Incentive telemetry/OOB/PT/OT  Valvular disease: TVR and AVR as above. -Follow echocardiogram  Essential hypertension: Normotensive. -Hold amlodipine while on Cardizem -Continue metoprolol   RA: Stable.   Does not seem to be on meds.ly    Mild Bronchiectasis/ILD Interstitial lung disease  -Followed by pulmonary out patient    History of BRCA s/p b/l mastectomy /chemo.  In remission.  Hypomagnesemia -Monitor replenish as appropriate   Underweight Body mass index is 18.36 kg/m. Consult dietitian          DVT prophylaxis:  apixaban (ELIQUIS) tablet 2.5 mg Start: 03/02/23 2200 apixaban (ELIQUIS) tablet 2.5 mg  Code Status: Full code Family Communication: None at bedside Level of care: Progressive Status is: Inpatient Remains inpatient appropriate because: A-fib with RVR, acute CHF and acute respiratory failure   Final disposition: Likely home Consultants:  Cardiology  55 minutes with more than 50% spent in reviewing records, counseling patient/family and coordinating care.   Sch Meds:  Scheduled Meds:  apixaban  2.5 mg Oral BID   furosemide  40 mg Oral BID   metoprolol succinate  100 mg Oral Daily   Continuous Infusions:  diltiazem (CARDIZEM) infusion 10 mg/hr (03/03/23 1115)   magnesium sulfate bolus IVPB 2 g (03/03/23 1113)   PRN Meds:.acetaminophen **OR** acetaminophen, levalbuterol, ondansetron **OR** ondansetron (ZOFRAN) IV  Antimicrobials: Anti-infectives (From admission, onward)    None        I have personally reviewed the following labs and images: CBC: Recent Labs  Lab 03/02/23 1957  WBC 9.2  NEUTROABS 7.1  HGB 14.0  HCT 40.8  MCV 91.1  PLT 226   BMP &GFR Recent Labs  Lab 03/02/23 1957 03/03/23 0106  NA 136 136  K 4.2 3.7  CL 101 101  CO2 24 25  GLUCOSE 179* 121*  BUN 24* 20  CREATININE 0.94 0.90  CALCIUM 10.4* 9.6  MG  --  1.5*  PHOS  --  5.1*   CrCl cannot be calculated (Unknown ideal weight.). Liver & Pancreas: Recent Labs  Lab 03/03/23 0106  ALBUMIN 3.7   No results for input(s): "LIPASE", "AMYLASE" in the last 168 hours. No results for input(s): "AMMONIA" in the last 168 hours. Diabetic: No results for input(s):  "HGBA1C" in the last 72 hours. No results for input(s): "GLUCAP" in the last 168 hours. Cardiac Enzymes: No results for input(s): "CKTOTAL", "CKMB", "CKMBINDEX", "TROPONINI" in the last 168 hours. No results for input(s): "PROBNP" in the last 8760 hours. Coagulation Profile: No results for input(s): "INR", "PROTIME" in the last 168 hours. Thyroid Function Tests: Recent Labs    03/03/23 0106  TSH 2.094   Lipid Profile: No results for input(s): "CHOL", "HDL", "LDLCALC", "TRIG", "CHOLHDL", "LDLDIRECT" in the last 72 hours. Anemia Panel: No results for input(s): "VITAMINB12", "FOLATE", "FERRITIN", "TIBC", "IRON", "RETICCTPCT" in the last 72 hours. Urine analysis: No results found for: "COLORURINE", "APPEARANCEUR", "LABSPEC", "PHURINE", "GLUCOSEU", "HGBUR", "BILIRUBINUR", "KETONESUR", "PROTEINUR", "UROBILINOGEN", "NITRITE", "LEUKOCYTESUR" Sepsis Labs: Invalid input(s): "PROCALCITONIN", "LACTICIDVEN"  Microbiology: Recent Results (from the past 240 hour(s))  Resp panel by RT-PCR (RSV, Flu A&B, Covid) Anterior Nasal Swab     Status: None   Collection Time: 03/02/23  7:57 PM   Specimen: Anterior Nasal Swab  Result Value Ref  Range Status   SARS Coronavirus 2 by RT PCR NEGATIVE NEGATIVE Final    Comment: (NOTE) SARS-CoV-2 target nucleic acids are NOT DETECTED.  The SARS-CoV-2 RNA is generally detectable in upper respiratory specimens during the acute phase of infection. The lowest concentration of SARS-CoV-2 viral copies this assay can detect is 138 copies/mL. A negative result does not preclude SARS-Cov-2 infection and should not be used as the sole basis for treatment or other patient management decisions. A negative result may occur with  improper specimen collection/handling, submission of specimen other than nasopharyngeal swab, presence of viral mutation(s) within the areas targeted by this assay, and inadequate number of viral copies(<138 copies/mL). A negative result must be  combined with clinical observations, patient history, and epidemiological information. The expected result is Negative.  Fact Sheet for Patients:  EntrepreneurPulse.com.au  Fact Sheet for Healthcare Providers:  IncredibleEmployment.be  This test is no t yet approved or cleared by the [place] FDA and  has been authorized for detection and/or diagnosis of SARS-CoV-2 by FDA under an Emergency Use Authorization (EUA). This EUA will remain  in effect (meaning this test can be used) for the duration of the COVID-19 declaration under Section 564(b)(1) of the Act, 21 U.S.C.section 360bbb-3(b)(1), unless the authorization is terminated  or revoked sooner.       Influenza A by PCR NEGATIVE NEGATIVE Final   Influenza B by PCR NEGATIVE NEGATIVE Final    Comment: (NOTE) The Xpert Xpress SARS-CoV-2/FLU/RSV plus assay is intended as an aid in the diagnosis of influenza from Nasopharyngeal swab specimens and should not be used as a sole basis for treatment. Nasal washings and aspirates are unacceptable for Xpert Xpress SARS-CoV-2/FLU/RSV testing.  Fact Sheet for Patients: EntrepreneurPulse.com.au  Fact Sheet for Healthcare Providers: IncredibleEmployment.be  This test is not yet approved or cleared by the [place] FDA and has been authorized for detection and/or diagnosis of SARS-CoV-2 by FDA under an Emergency Use Authorization (EUA). This EUA will remain in effect (meaning this test can be used) for the duration of the COVID-19 declaration under Section 564(b)(1) of the Act, 21 U.S.C. section 360bbb-3(b)(1), unless the authorization is terminated or revoked.     Resp Syncytial Virus by PCR NEGATIVE NEGATIVE Final    Comment: (NOTE) Fact Sheet for Patients: EntrepreneurPulse.com.au  Fact Sheet for Healthcare Providers: IncredibleEmployment.be  This test is not yet  approved or cleared by the [place] FDA and has been authorized for detection and/or diagnosis of SARS-CoV-2 by FDA under an Emergency Use Authorization (EUA). This EUA will remain in effect (meaning this test can be used) for the duration of the COVID-19 declaration under Section 564(b)(1) of the Act, 21 U.S.C. section 360bbb-3(b)(1), unless the authorization is terminated or revoked.  Performed at [facility], [address] [place], [place] [address]     Radiology Studies: CT Angio Chest PE W and/or Wo Contrast  Result Date: 03/02/2023 CLINICAL DATA:  Shortness of breath, tachypnea, tachycardia, concern for pulmonary embolism. EXAM: CT ANGIOGRAPHY CHEST WITH CONTRAST TECHNIQUE: Multidetector CT imaging of the chest was performed using the standard protocol during bolus administration of intravenous contrast. Multiplanar CT image reconstructions and MIPs were obtained to evaluate the vascular anatomy. RADIATION DOSE REDUCTION: This exam was performed according to the departmental dose-optimization program which includes automated exposure control, adjustment of the mA and/or kV according to patient size and/or use of iterative reconstruction technique. CONTRAST:  41mL OMNIPAQUE IOHEXOL 350 MG/ML SOLN COMPARISON:  Same day chest radiograph and  CT chest dated 01/23/2019. FINDINGS: Cardiovascular: Satisfactory opacification of the pulmonary arteries to the segmental level. No evidence of pulmonary embolism. The right and left pulmonary arteries are enlarged, measuring 2.6 cm and 2.5 cm respectively, suggestive of pulmonary hypertension. Vascular calcifications are seen in the coronary arteries and aortic arch. The heart is enlarged. No pericardial effusion. Mediastinum/Nodes: No enlarged mediastinal, hilar, or axillary lymph nodes. Thyroid gland, trachea, and esophagus demonstrate no significant findings. Bilateral breast implants are noted. Lungs/Pleura: There is a moderate  right and small left pleural effusion with associated atelectasis. There is mild atelectasis/scarring in the right middle lobe and the lingula. There is no pneumothorax. Upper Abdomen: No acute abnormality. Musculoskeletal: No chest wall abnormality. No acute or significant osseous findings. Review of the MIP images confirms the above findings. IMPRESSION: 1. No evidence of pulmonary embolism. 2. Moderate right and small left pleural effusions with associated atelectasis. 3. Cardiomegaly and findings suggestive of pulmonary hypertension. Aortic Atherosclerosis (ICD10-I70.0). Electronically Signed   By: Zerita Boers M.D.   On: 03/02/2023 21:41   DG Chest Portable 1 View  Result Date: 03/02/2023 CLINICAL DATA:  Cough, shortness of breath EXAM: PORTABLE CHEST 1 VIEW COMPARISON:  10/12/2015 FINDINGS: There is hyperinflation of the lungs compatible with COPD. Cardiomegaly, aortic atherosclerosis. Airspace opacity in the right lung base with small right pleural effusion. No confluent opacity on the left. Biapical scarring. No acute bony abnormality. IMPRESSION: COPD/chronic changes. Cardiomegaly. Right lower lobe airspace opacity with small right effusion. This could reflect pneumonia. Electronically Signed   By: Rolm Baptise M.D.   On: 03/02/2023 20:10      Esli Jernigan T. [place]  If 7PM-7AM, please contact night-coverage [email or web address] 03/03/2023, 11:17 AM

## 2023-03-03 NOTE — Progress Notes (Signed)
Rounding Note    Patient Name: Sarah Lewis Date of Encounter: 03/03/2023  [place] Cardiologist: None Dr. Kirk Ruths  Subjective   She remains SOB. No CP. Amenable to TEE/DCCV, discussed would like to diurese first.   Inpatient Medications    Scheduled Meds:  apixaban  2.5 mg Oral BID   furosemide  40 mg Oral BID   metoprolol succinate  100 mg Oral Daily   Continuous Infusions:  diltiazem (CARDIZEM) infusion 10 mg/hr (03/03/23 1115)   magnesium sulfate bolus IVPB 2 g (03/03/23 1113)   PRN Meds: acetaminophen **OR** acetaminophen, levalbuterol, ondansetron **OR** ondansetron (ZOFRAN) IV   Vital Signs    Vitals:   03/03/23 0700 03/03/23 0848 03/03/23 0900 03/03/23 1000  BP: 118/88 119/69 134/84 114/81  Pulse: 89 98 (!) 102 65  Resp:  18    Temp:  98 F (36.7 C)    TempSrc:  Oral    SpO2: 90% 92% 90% 92%  Weight:        Intake/Output Summary (Last 24 hours) at 03/03/2023 1148 Last data filed at 03/03/2023 1115 Gross per 24 hour  Intake --  Output 2200 ml  Net -2200 ml      03/03/2023    6:42 AM 04/13/2022    8:10 AM 10/30/2021    3:41 PM  Last 3 Weights  Weight (lbs) 94 lb 92 lb 92 lb 6.4 oz  Weight (kg) 42.638 kg 41.731 kg 41.912 kg      Telemetry    Afib rates < 120 - Personally Reviewed  ECG    No new - Personally Reviewed  Physical Exam   Vitals:   03/03/23 0900 03/03/23 1000  BP: 134/84 114/81  Pulse: (!) 102 65  Resp:    Temp:    SpO2: 90% 92%    GEN: No acute distress. Frail  On 2 L O2 Neck: challenging to assess with accessory muscle use Cardiac: IRRR, no murmurs, rubs, or gallops.  Respiratory: use of accessory muscles, decreased BS BL GI: Soft, nontender, non-distended  MS: No edema; No deformity. Neuro:  Nonfocal  Psych: Normal affect   Labs    High Sensitivity Troponin:   Recent Labs  Lab 03/02/23 1957  TROPONINIHS 8     Chemistry Recent Labs  Lab 03/02/23 1957 03/03/23 0106  NA 136 136   K 4.2 3.7  CL 101 101  CO2 24 25  GLUCOSE 179* 121*  BUN 24* 20  CREATININE 0.94 0.90  CALCIUM 10.4* 9.6  MG  --  1.5*  ALBUMIN  --  3.7  GFRNONAA 60* >60  ANIONGAP 11 10    Lipids No results for input(s): "CHOL", "TRIG", "HDL", "LABVLDL", "LDLCALC", "CHOLHDL" in the last 168 hours.  Hematology Recent Labs  Lab 03/02/23 1957  WBC 9.2  RBC 4.48  HGB 14.0  HCT 40.8  MCV 91.1  MCH 31.3  MCHC 34.3  RDW 13.5  PLT 226   Thyroid  Recent Labs  Lab 03/03/23 0106  TSH 2.094    BNP Recent Labs  Lab 03/02/23 1957  BNP 894.0*    DDimer  Recent Labs  Lab 03/02/23 1957  DDIMER 1.60*     Radiology    CT Angio Chest PE W and/or Wo Contrast  Result Date: 03/02/2023 CLINICAL DATA:  Shortness of breath, tachypnea, tachycardia, concern for pulmonary embolism. EXAM: CT ANGIOGRAPHY CHEST WITH CONTRAST TECHNIQUE: Multidetector CT imaging of the chest was performed using the standard protocol during bolus administration  of intravenous contrast. Multiplanar CT image reconstructions and MIPs were obtained to evaluate the vascular anatomy. RADIATION DOSE REDUCTION: This exam was performed according to the departmental dose-optimization program which includes automated exposure control, adjustment of the mA and/or kV according to patient size and/or use of iterative reconstruction technique. CONTRAST:  58mL OMNIPAQUE IOHEXOL 350 MG/ML SOLN COMPARISON:  Same day chest radiograph and CT chest dated 01/23/2019. FINDINGS: Cardiovascular: Satisfactory opacification of the pulmonary arteries to the segmental level. No evidence of pulmonary embolism. The right and left pulmonary arteries are enlarged, measuring 2.6 cm and 2.5 cm respectively, suggestive of pulmonary hypertension. Vascular calcifications are seen in the coronary arteries and aortic arch. The heart is enlarged. No pericardial effusion. Mediastinum/Nodes: No enlarged mediastinal, hilar, or axillary lymph nodes. Thyroid gland, trachea,  and esophagus demonstrate no significant findings. Bilateral breast implants are noted. Lungs/Pleura: There is a moderate right and small left pleural effusion with associated atelectasis. There is mild atelectasis/scarring in the right middle lobe and the lingula. There is no pneumothorax. Upper Abdomen: No acute abnormality. Musculoskeletal: No chest wall abnormality. No acute or significant osseous findings. Review of the MIP images confirms the above findings. IMPRESSION: 1. No evidence of pulmonary embolism. 2. Moderate right and small left pleural effusions with associated atelectasis. 3. Cardiomegaly and findings suggestive of pulmonary hypertension. Aortic Atherosclerosis (ICD10-I70.0). Electronically Signed   By: Zerita Boers M.D.   On: 03/02/2023 21:41   DG Chest Portable 1 View  Result Date: 03/02/2023 CLINICAL DATA:  Cough, shortness of breath EXAM: PORTABLE CHEST 1 VIEW COMPARISON:  10/12/2015 FINDINGS: There is hyperinflation of the lungs compatible with COPD. Cardiomegaly, aortic atherosclerosis. Airspace opacity in the right lung base with small right pleural effusion. No confluent opacity on the left. Biapical scarring. No acute bony abnormality. IMPRESSION: COPD/chronic changes. Cardiomegaly. Right lower lobe airspace opacity with small right effusion. This could reflect pneumonia. Electronically Signed   By: Rolm Baptise M.D.   On: 03/02/2023 20:10    Cardiac Studies  Pending FU TTE  TTE 04/30/2022 1. Left ventricular ejection fraction, by estimation, is 55 to 60%. The  left ventricle has normal function. The left ventricle has no regional  wall motion abnormalities. Left ventricular diastolic parameters are  consistent with Grade II diastolic  dysfunction (pseudonormalization).   2. Right ventricular systolic function is normal. The right ventricular  size is moderately enlarged. There is mildly elevated pulmonary artery  systolic pressure. The estimated right ventricular  systolic pressure is  123456 mmHg.   3. Left atrial size was mildly dilated.   4. Right atrial size was mildly dilated.   5. The mitral valve is grossly normal. Mild mitral valve regurgitation.  No evidence of mitral stenosis.   6. The tricuspid valve is abnormal. Tricuspid valve regurgitation is  moderate to severe.   7. The aortic valve is calcified. Aortic valve regurgitation is moderate.   8. The inferior vena cava is normal in size with greater than 50%  respiratory variability, suggesting right atrial pressure of 3 mmHg.   Comparison(s): Changes from prior study are noted. EF unchanged. AI is now  moderate. TR still moderate to severe.    CHA2DS2-VASc Score = 5   This indicates a 7.2% annual risk of stroke. The patient's score is based upon: CHF History: 1 HTN History: 1 Diabetes History: 0 Stroke History: 0 Vascular Disease History: 0 Age Score: 2 Gender Score: 1      Patient Profile     Sarah Gilles  Lewis is a 87 y.o. female with PMHx HTN, Noted hx of ILD -noted scarring in the R middle lobe and the lingula, BRCA s/p BiL mastectomy (2016), pulmonary hypertension, and valvular heart disease including moderate-severe TR, moderate aortic regurgitation, bileaflet MVP with mild MR who is being seen today for the evaluation of AFRVR and volume overload at the request of Hospital Medicine.   Assessment & Plan     #HFpEF p/w SOB. BNP 894. In the setting of afib. She has Group II, III PHTN. CT PE negative for PE. Had moderate R and small L pleural effusions. C/b hx of ILD - continue lasix 40 mg BID - net negative 1.4L, crt normal - can consider SGLT2 closer to dispo  #Paroxysmal Atrial Fibrillation: New onset - continue dilt gtt - continue metop XL 100 mg daily - continue eliquis 2.5 mg BID - recommend adequate diuresis prior to considering TEE/DCCV  #Moderate AI: sclerotic/calcified valve. Pending repeat study  #Mild MR: degenerative: Pending repeat study   Time Spent  Directly with Patient:  I have spent a total of 35 minutes with the patient reviewing hospital notes, telemetry, EKGs, labs and examining the patient as well as establishing an assessment and plan that was discussed personally with the patient.  > 50% of time was spent in direct patient care.    For questions or updates, please contact [facility] Please consult [email or web address] for contact info under        Signed, Janina Mayo, MD  03/03/2023, 11:48 AM

## 2023-03-03 NOTE — Progress Notes (Signed)
*  PRELIMINARY RESULTS* Echocardiogram 2D Echocardiogram has been performed.  Sarah Lewis 03/03/2023, 4:41 PM

## 2023-03-04 ENCOUNTER — Other Ambulatory Visit (HOSPITAL_COMMUNITY): Payer: Self-pay

## 2023-03-04 DIAGNOSIS — I4891 Unspecified atrial fibrillation: Secondary | ICD-10-CM | POA: Diagnosis not present

## 2023-03-04 DIAGNOSIS — I509 Heart failure, unspecified: Secondary | ICD-10-CM | POA: Diagnosis not present

## 2023-03-04 DIAGNOSIS — J9601 Acute respiratory failure with hypoxia: Secondary | ICD-10-CM | POA: Diagnosis not present

## 2023-03-04 LAB — ECHOCARDIOGRAM COMPLETE
Area-P 1/2: 4.68 cm2
P 1/2 time: 492 msec
S' Lateral: 2.5 cm
Weight: 1504 oz

## 2023-03-04 LAB — RENAL FUNCTION PANEL
Albumin: 3.4 g/dL — ABNORMAL LOW (ref 3.5–5.0)
Anion gap: 13 (ref 5–15)
BUN: 22 mg/dL (ref 8–23)
CO2: 26 mmol/L (ref 22–32)
Calcium: 8.2 mg/dL — ABNORMAL LOW (ref 8.9–10.3)
Chloride: 93 mmol/L — ABNORMAL LOW (ref 98–111)
Creatinine, Ser: 1.17 mg/dL — ABNORMAL HIGH (ref 0.44–1.00)
GFR, Estimated: 46 mL/min — ABNORMAL LOW (ref >60)
Glucose, Bld: 121 mg/dL — ABNORMAL HIGH (ref 70–99)
Phosphorus: 4.3 mg/dL (ref 2.5–4.6)
Potassium: 3.7 mmol/L (ref 3.5–5.1)
Sodium: 132 mmol/L — ABNORMAL LOW (ref 135–145)

## 2023-03-04 LAB — CBC
HCT: 38.2 % (ref 36.0–46.0)
Hemoglobin: 13.5 g/dL (ref 12.0–15.0)
MCH: 31.9 pg (ref 26.0–34.0)
MCHC: 35.3 g/dL (ref 30.0–36.0)
MCV: 90.3 fL (ref 80.0–100.0)
Platelets: 278 10*3/uL (ref 150–400)
RBC: 4.23 MIL/uL (ref 3.87–5.11)
RDW: 13.4 % (ref 11.5–15.5)
WBC: 9.9 10*3/uL (ref 4.0–10.5)
nRBC: 0 % (ref 0.0–0.2)

## 2023-03-04 LAB — HEMOGLOBIN A1C
Hgb A1c MFr Bld: 5.7 % — ABNORMAL HIGH (ref 4.8–5.6)
Mean Plasma Glucose: 117 mg/dL

## 2023-03-04 LAB — PROTIME-INR
INR: 1.3 — ABNORMAL HIGH (ref 0.8–1.2)
Prothrombin Time: 15.8 seconds — ABNORMAL HIGH (ref 11.4–15.2)

## 2023-03-04 LAB — MAGNESIUM: Magnesium: 1.9 mg/dL (ref 1.7–2.4)

## 2023-03-04 MED ORDER — SODIUM CHLORIDE 0.9 % IV SOLN: INTRAVENOUS | Status: DC

## 2023-03-04 MED ORDER — FUROSEMIDE 10 MG/ML IJ SOLN
40.0000 mg | Freq: Two times a day (BID) | INTRAMUSCULAR | Status: DC
  Administered 2023-03-04: 40 mg via INTRAVENOUS
  Filled 2023-03-04 (×2): qty 4

## 2023-03-04 MED ORDER — TRAZODONE HCL 50 MG PO TABS
25.0000 mg | ORAL_TABLET | Freq: Every evening | ORAL | Status: DC | PRN
  Administered 2023-03-04 – 2023-03-05 (×3): 25 mg via ORAL
  Filled 2023-03-04 (×3): qty 1

## 2023-03-04 MED ORDER — DILTIAZEM HCL 30 MG PO TABS
30.0000 mg | ORAL_TABLET | Freq: Four times a day (QID) | ORAL | Status: AC
  Administered 2023-03-04 (×2): 30 mg via ORAL
  Filled 2023-03-04 (×2): qty 1

## 2023-03-04 NOTE — Progress Notes (Signed)
PROGRESS NOTE  Sarah Lewis [id number] DOB: [birth year]   PCP: Carol Ada, MD  Patient is from: Home.  Independently ambulates at baseline.  DOA: 03/02/2023 LOS: 2  Chief complaints Chief Complaint  Patient presents with   Shortness of Breath     Brief Narrative / Interim history: 87 year old F with PMH of diastolic CHF (G2 DD), mod-sev TVR, aortic insufficiency, BRCA s/p b/l mastectomy and chemo, ILD and bronchiectasis presenting with increased shortness of breath that did not improve with bronchodilators as usual, and admitted with new onset atrial fibrillation with RVR.  HR elevated to 148.  BNP 894.  Negative troponin.  D-dimer 1.6.  CXR concerning for RLL air opacity with small right pleural effusion.  CTA chest negative for PE but moderate right and small left pleural effusion with associated atelectasis, cardiomegaly and finding suggesting PAH.  Patient was started on Cardizem drip, low-dose Eliquis and IV Lasix.  Cardiology consulted and following.  Plan for TEE cardioversion once euvolemic.    Subjective: Seen and examined earlier this morning.  No major events overnight of this morning.  No complaints.  She reports improvement in her breathing.  Still in A-fib but rate controlled.  Excellent urine output.  About 1.4 L / 24 hours. Cr slightly up.  Objective: Vitals:   03/04/23 1000 03/04/23 1014 03/04/23 1022 03/04/23 1208  BP:    (!) 97/59  Pulse: 63 85 74 81  Resp:    18  Temp:    98 F (36.7 C)  TempSrc:    Oral  SpO2: 91% (!) 89% 93% 94%  Weight:        Examination:  GENERAL: Alert.  Frail.  No apparent distress. HEENT: MMM.  Vision and hearing grossly intact.  NECK: Supple.  No apparent JVD.  RESP:  No IWOB.  Fair aeration bilaterally. CVS:  Irregular rhythm.  Normal rate. Heart sounds normal.  ABD/GI/GU: BS+. Abd soft, NTND.  MSK/EXT:   No apparent deformity. Moves extremities. No edema.  SKIN: no apparent skin lesion or wound NEURO: Awake and  alert. Oriented appropriately.  No apparent focal neuro deficit. PSYCH: Calm. Normal affect.   Procedures:  None  Microbiology summarized: [id number], influenza and RSV PCR nonreactive  Assessment and plan: Principal Problem:   Atrial fibrillation with rapid ventricular response (HCC) Active Problems:   Rheumatoid arthritis ([place])   Malignant neoplasm of lower-outer quadrant of right female breast ([place])   Osteoporosis   Acute heart failure with preserved ejection fraction (HCC)   Pleural effusion due to CHF (congestive heart failure) ([place])   New onset atrial fibrillation (HCC)   Acute respiratory failure with hypoxia ([place])  New onset Atrial fibrillation with RVR: Unclear etiology but suspect CHF exacerbation.  She has history of valvular disease with mod to sev TVR and mod AVR on hide echo in 04/2022.  She is not on diuretics at home.  She is also on albuterol which could trigger arrhythmia.  TSH normal.  RVR resolved. -Cardiology on board -Off Cardizem drip, on Toprol-XL 100 mg daily, and low-dose Eliquis. -Plan for TEE/DCCV -Optimize electrolytes  Acute HFpEF: TTE with LVEF of 55 to 60%, ?DD, sev LAE and RAE, mod AVR and TVR, and RVSP of 41.2 mmHg.  Appears euvolemic on exam but imaging with moderate right and small left pleural effusion.  BNP elevated to 900.  She is not on diuretics at home.  Started on IV Lasix 40 mg twice daily. Net -2L. Cr slightly up.  -Cardiology on  board-on IV Lasix 40 mg twice daily -Continue IV Lasix 40 mg twice daily -Follow echocardiogram -Strict intake and output, daily weight, renal functions and electrolytes  Acute respiratory failure with hypoxia: Desaturated to 88% on RA and started on 2 L.  Likely due to the above -Management as above -Wean off oxygen as able -Xopenex as needed -Incentive telemetry/OOB/PT/OT  Valvular disease: Moderate TVR and AVR as above.  Stable.  Essential hypertension: Normotensive. -Toprol-XL as above. -Hold  amlodipine.   RA: Stable.  Does not seem to be on meds.ly    Mild Bronchiectasis/ILD Interstitial lung disease  -Followed by pulmonary out patient    History of BRCA s/p b/l mastectomy /chemo.  In remission.  Hypomagnesemia -Monitor replenish as appropriate   Underweight Body mass index is 18.36 kg/m. Consult dietitian          DVT prophylaxis:  apixaban (ELIQUIS) tablet 2.5 mg Start: 03/02/23 2200 apixaban (ELIQUIS) tablet 2.5 mg  Code Status: Full code Family Communication: None at bedside Level of care: Telemetry Cardiac Status is: Inpatient Remains inpatient appropriate because: A-fib with RVR, acute CHF and acute respiratory failure   Final disposition: Likely home Consultants:  Cardiology  55 minutes with more than 50% spent in reviewing records, counseling patient/family and coordinating care.   Sch Meds:  Scheduled Meds:  apixaban  2.5 mg Oral BID   furosemide  40 mg Intravenous BID   metoprolol succinate  100 mg Oral Daily   Continuous Infusions:  diltiazem (CARDIZEM) infusion Stopped (03/04/23 1022)   PRN Meds:.acetaminophen **OR** acetaminophen, levalbuterol, ondansetron **OR** ondansetron (ZOFRAN) IV, traZODone  Antimicrobials: Anti-infectives (From admission, onward)    None        I have personally reviewed the following labs and images: CBC: Recent Labs  Lab 03/02/23 1957 03/04/23 0035  WBC 9.2 9.9  NEUTROABS 7.1  --   HGB 14.0 13.5  HCT 40.8 38.2  MCV 91.1 90.3  PLT 226 278   BMP &GFR Recent Labs  Lab 03/02/23 1957 03/03/23 0106 03/04/23 0035  NA 136 136 132*  K 4.2 3.7 3.7  CL 101 101 93*  CO2 24 25 26   GLUCOSE 179* 121* 121*  BUN 24* 20 22  CREATININE 0.94 0.90 1.17*  CALCIUM 10.4* 9.6 8.2*  MG  --  1.5* 1.9  PHOS  --  5.1* 4.3   CrCl cannot be calculated (Unknown ideal weight.). Liver & Pancreas: Recent Labs  Lab 03/03/23 0106 03/04/23 0035  ALBUMIN 3.7 3.4*   No results for input(s): "LIPASE",  "AMYLASE" in the last 168 hours. No results for input(s): "AMMONIA" in the last 168 hours. Diabetic: Recent Labs    03/03/23 0106  HGBA1C 5.7*   No results for input(s): "GLUCAP" in the last 168 hours. Cardiac Enzymes: No results for input(s): "CKTOTAL", "CKMB", "CKMBINDEX", "TROPONINI" in the last 168 hours. No results for input(s): "PROBNP" in the last 8760 hours. Coagulation Profile: No results for input(s): "INR", "PROTIME" in the last 168 hours. Thyroid Function Tests: Recent Labs    03/03/23 0106  TSH 2.094   Lipid Profile: No results for input(s): "CHOL", "HDL", "LDLCALC", "TRIG", "CHOLHDL", "LDLDIRECT" in the last 72 hours. Anemia Panel: No results for input(s): "VITAMINB12", "FOLATE", "FERRITIN", "TIBC", "IRON", "RETICCTPCT" in the last 72 hours. Urine analysis: No results found for: "COLORURINE", "APPEARANCEUR", "LABSPEC", "PHURINE", "GLUCOSEU", "HGBUR", "BILIRUBINUR", "KETONESUR", "PROTEINUR", "UROBILINOGEN", "NITRITE", "LEUKOCYTESUR" Sepsis Labs: Invalid input(s): "PROCALCITONIN", "LACTICIDVEN"  Microbiology: Recent Results (from the past 240 hour(s))  Resp panel by RT-PCR (RSV,  Flu A&B, Covid) Anterior Nasal Swab     Status: None   Collection Time: 03/02/23  7:57 PM   Specimen: Anterior Nasal Swab  Result Value Ref Range Status   SARS Coronavirus 2 by RT PCR NEGATIVE NEGATIVE Final    Comment: (NOTE) SARS-CoV-2 target nucleic acids are NOT DETECTED.  The SARS-CoV-2 RNA is generally detectable in upper respiratory specimens during the acute phase of infection. The lowest concentration of SARS-CoV-2 viral copies this assay can detect is 138 copies/mL. A negative result does not preclude SARS-Cov-2 infection and should not be used as the sole basis for treatment or other patient management decisions. A negative result may occur with  improper specimen collection/handling, submission of specimen other than nasopharyngeal swab, presence of viral mutation(s)  within the areas targeted by this assay, and inadequate number of viral copies(<138 copies/mL). A negative result must be combined with clinical observations, patient history, and epidemiological information. The expected result is Negative.  Fact Sheet for Patients:  EntrepreneurPulse.com.au  Fact Sheet for Healthcare Providers:  IncredibleEmployment.be  This test is no t yet approved or cleared by the [place] FDA and  has been authorized for detection and/or diagnosis of SARS-CoV-2 by FDA under an Emergency Use Authorization (EUA). This EUA will remain  in effect (meaning this test can be used) for the duration of the COVID-19 declaration under Section 564(b)(1) of the Act, 21 U.S.C.section 360bbb-3(b)(1), unless the authorization is terminated  or revoked sooner.       Influenza A by PCR NEGATIVE NEGATIVE Final   Influenza B by PCR NEGATIVE NEGATIVE Final    Comment: (NOTE) The Xpert Xpress SARS-CoV-2/FLU/RSV plus assay is intended as an aid in the diagnosis of influenza from Nasopharyngeal swab specimens and should not be used as a sole basis for treatment. Nasal washings and aspirates are unacceptable for Xpert Xpress SARS-CoV-2/FLU/RSV testing.  Fact Sheet for Patients: EntrepreneurPulse.com.au  Fact Sheet for Healthcare Providers: IncredibleEmployment.be  This test is not yet approved or cleared by the [place] FDA and has been authorized for detection and/or diagnosis of SARS-CoV-2 by FDA under an Emergency Use Authorization (EUA). This EUA will remain in effect (meaning this test can be used) for the duration of the COVID-19 declaration under Section 564(b)(1) of the Act, 21 U.S.C. section 360bbb-3(b)(1), unless the authorization is terminated or revoked.     Resp Syncytial Virus by PCR NEGATIVE NEGATIVE Final    Comment: (NOTE) Fact Sheet for  Patients: EntrepreneurPulse.com.au  Fact Sheet for Healthcare Providers: IncredibleEmployment.be  This test is not yet approved or cleared by the [place] FDA and has been authorized for detection and/or diagnosis of SARS-CoV-2 by FDA under an Emergency Use Authorization (EUA). This EUA will remain in effect (meaning this test can be used) for the duration of the COVID-19 declaration under Section 564(b)(1) of the Act, 21 U.S.C. section 360bbb-3(b)(1), unless the authorization is terminated or revoked.  Performed at [facility], [address], [place], [place] [address]     Radiology Studies: ECHOCARDIOGRAM COMPLETE  Result Date: 03/04/2023    ECHOCARDIOGRAM REPORT   Patient Name:   Sarah Lewis Date of Exam: 03/03/2023 Medical Rec #:  [id number]        Height:       60.0 in Accession #:    [id number]       Weight:       94.0 lb Date of Birth:  [birth year]       BSA:  1.354 m Patient Age:    52 years         BP:           109/62 mmHg Patient Gender: F                HR:           92 bpm. Exam Location:  Inpatient Procedure: 2D Echo, Cardiac Doppler and Color Doppler Indications:    Atrial Fibrillation I48.91  History:        Patient has prior history of Echocardiogram examinations, most                 recent 04/30/2022. CHF, Aortic Valve Disease, Arrythmias:Atrial                 Fibrillation; Risk Factors:Hypertension. Acute heart failure                 with preserved ejection fraction. Malignant neoplasm of                 lower-outer quadrant of right female breast.  Sonographer:    Alvino Chapel RCS Referring Phys: [id number] [place]  1. Left ventricular ejection fraction, by estimation, is 55 to 60%. The left ventricle has normal function. The left ventricle has no regional wall motion abnormalities. Left ventricular diastolic parameters are indeterminate.  2. Right ventricular systolic function is  normal. The right ventricular size is normal. There is mildly elevated pulmonary artery systolic pressure. The estimated right ventricular systolic pressure is [id number] mmHg.  3. Left atrial size was severely dilated.  4. Right atrial size was severely dilated.  5. Mild mitral valve regurgitation.  6. Tricuspid valve regurgitation is moderate.  7. The aortic valve was not well visualized. There is moderate calcification of the aortic valve. Aortic valve regurgitation is moderate.  8. The inferior vena cava is normal in size with <50% respiratory variability, suggesting right atrial pressure of 8 mmHg. FINDINGS  Left Ventricle: Left ventricular ejection fraction, by estimation, is 55 to 60%. The left ventricle has normal function. The left ventricle has no regional wall motion abnormalities. The left ventricular internal cavity size was normal in size. There is  no left ventricular hypertrophy. Left ventricular diastolic parameters are indeterminate. Right Ventricle: The right ventricular size is normal. Right ventricular systolic function is normal. There is mildly elevated pulmonary artery systolic pressure. The tricuspid regurgitant velocity is 2.88 m/s, and with an assumed right atrial pressure of 8 mmHg, the estimated right ventricular systolic pressure is [id number] mmHg. Left Atrium: Left atrial size was severely dilated. Right Atrium: Right atrial size was severely dilated. Pericardium: There is no evidence of pericardial effusion. Mitral Valve: Mild mitral valve regurgitation. Tricuspid Valve: Tricuspid valve regurgitation is moderate. Aortic Valve: The aortic valve was not well visualized. There is moderate calcification of the aortic valve. Aortic valve regurgitation is moderate. Aortic regurgitation PHT measures 492 msec. Pulmonic Valve: Pulmonic valve regurgitation is not visualized. Aorta: The aortic root and ascending aorta are structurally normal, with no evidence of dilitation. Venous: The inferior vena cava  is normal in size with less than 50% respiratory variability, suggesting right atrial pressure of 8 mmHg. IAS/Shunts: No atrial level shunt detected by color flow Doppler.  LEFT VENTRICLE PLAX 2D LVIDd:         3.50 cm LVIDs:         2.50 cm LV PW:         0.90 cm LV  IVS:        0.90 cm LVOT diam:     1.50 cm LV SV:         32 LV SV Index:   24 LVOT Area:     1.77 cm  RIGHT VENTRICLE TAPSE (M-mode): 1.8 cm LEFT ATRIUM              Index        RIGHT ATRIUM           Index LA diam:        3.80 cm  2.81 cm/m   RA Area:     23.00 cm LA Vol (A2C):   74.5 ml  55.01 ml/m  RA Volume:   73.45 ml  54.24 ml/m LA Vol (A4C):   101.0 ml 74.58 ml/m LA Biplane Vol: 86.8 ml  64.10 ml/m  AORTIC VALVE LVOT Vmax:   89.40 cm/s LVOT Vmean:  60.900 cm/s LVOT VTI:    0.182 m AI PHT:      492 msec  AORTA Ao Root diam: 2.90 cm MITRAL VALVE                TRICUSPID VALVE MV Area (PHT): 4.68 cm     TR Peak grad:   33.2 mmHg MV Decel Time: 162 msec     TR Vmax:        288.00 cm/s MV E velocity: 129.00 cm/s                             SHUNTS                             Systemic VTI:  0.18 m                             Systemic Diam: 1.50 cm Phineas Inches Electronically signed by Phineas Inches Signature Date/Time: 03/04/2023/8:43:14 AM    Final       Doyle Kunath T. [place]  If 7PM-7AM, please contact night-coverage [email or web address] 03/04/2023, 2:20 PM

## 2023-03-04 NOTE — TOC Benefit Eligibility Note (Signed)
Patient Advocate Encounter  Insurance verification completed.    The patient is currently admitted and upon discharge could be taking Eliquis 2.5 mg.  The current 30 day co-pay is $40.00.   The patient is insured through [employer] Part D   Waldine Zenz, CPHT Pharmacy Patient Advocate Specialist [facility] Patient Advocate Team Direct Number: [phone]  Fax: [phone]       

## 2023-03-04 NOTE — TOC Progression Note (Addendum)
Transition of Care [facility]) - Progression Note    Patient Details  Name: Sarah Lewis MRN: [id number] Date of Birth: 1936/05/23  Transition of Care [facility]) CM/SW Contact  Zenon Mayo, RN Phone Number: 03/04/2023, 3:00 PM  Clinical Narrative:     Transition of Care [facility]) Screening Note   Patient Details  Name: Sarah Lewis Date of Birth: Mar 13, 1936   Transition of Care [facility]) CM/SW Contact:    Zenon Mayo, RN Phone Number: 03/04/2023, 3:01 PM    Transition of Care Department [facility]) has reviewed patient and no TOC needs have been identified at this time. We will continue to monitor patient advancement through interdisciplinary progression rounds. If new patient transition needs arise, please place a TOC consult.  From home with wife, indep, afib RVR, weaning off Cardizem drip, may be for poss TEE/Cardiversion. Startin on Eliquis. Copay is 40.00. she is indep and states does not need any HH services.  Has a scale, bp cuff, neb machine.           Expected Discharge Plan and Services                                               Social Determinants of Health (SDOH) Interventions SDOH Screenings   Tobacco Use: Low Risk  (04/13/2022)    Readmission Risk Interventions     No data to display

## 2023-03-04 NOTE — Progress Notes (Addendum)
Rounding Note    Patient Name: Sarah Lewis Date of Encounter: 03/04/2023  [place] Cardiologist: None Dr. Kirk Ruths  Subjective   Net negative 560cc yesterday, -2.0 L on admission.  She has been on p.o. Lasix 40 mg twice daily.  Mild bump in creatinine (0.9 > 1.17). Weight not recorded.  Reports dyspnea improved.  Denies any chest pain  Inpatient Medications    Scheduled Meds:  apixaban  2.5 mg Oral BID   furosemide  40 mg Oral BID   metoprolol succinate  100 mg Oral Daily   Continuous Infusions:  diltiazem (CARDIZEM) infusion 10 mg/hr (03/04/23 0550)   PRN Meds: acetaminophen **OR** acetaminophen, levalbuterol, ondansetron **OR** ondansetron (ZOFRAN) IV, traZODone   Vital Signs    Vitals:   03/04/23 0000 03/04/23 0200 03/04/23 0623 03/04/23 0714  BP: (!) 109/56 114/77 102/63 99/60  Pulse: 70 85 73 80  Resp: 20   18  Temp: (!) 97.4 F (36.3 C)  98.2 F (36.8 C) 98.2 F (36.8 C)  TempSrc:    Oral  SpO2: 91% 92% 90% 98%  Weight:        Intake/Output Summary (Last 24 hours) at 03/04/2023 0836 Last data filed at 03/04/2023 0700 Gross per 24 hour  Intake 837.9 ml  Output 1400 ml  Net -562.1 ml       03/03/2023    6:42 AM 04/13/2022    8:10 AM 10/30/2021    3:41 PM  Last 3 Weights  Weight (lbs) 94 lb 92 lb 92 lb 6.4 oz  Weight (kg) 42.638 kg 41.731 kg 41.912 kg      Telemetry    Afib rates 70-90s - Personally Reviewed  ECG    No new - Personally Reviewed  Physical Exam   Vitals:   03/04/23 0623 03/04/23 0714  BP: 102/63 99/60  Pulse: 73 80  Resp:  18  Temp: 98.2 F (36.8 C) 98.2 F (36.8 C)  SpO2: 90% 98%    GEN: No acute distress. Frail  On 2 L O2 Neck: +JVD Cardiac: IRRR, no murmurs, rubs, or gallops.  Respiratory:crackles, decreased BS BL GI: Soft, nontender, non-distended  MS: No edema; No deformity. Neuro:  Nonfocal  Psych: Normal affect   Labs    High Sensitivity Troponin:   Recent Labs  Lab  03/02/23 1957  TROPONINIHS 8      Chemistry Recent Labs  Lab 03/02/23 1957 03/03/23 0106 03/04/23 0035  NA 136 136 132*  K 4.2 3.7 3.7  CL 101 101 93*  CO2 24 25 26   GLUCOSE 179* 121* 121*  BUN 24* 20 22  CREATININE 0.94 0.90 1.17*  CALCIUM 10.4* 9.6 8.2*  MG  --  1.5* 1.9  ALBUMIN  --  3.7 3.4*  GFRNONAA 60* >60 46*  ANIONGAP 11 10 13      Lipids No results for input(s): "CHOL", "TRIG", "HDL", "LABVLDL", "LDLCALC", "CHOLHDL" in the last 168 hours.  Hematology Recent Labs  Lab 03/02/23 1957 03/04/23 0035  WBC 9.2 9.9  RBC 4.48 4.23  HGB 14.0 13.5  HCT 40.8 38.2  MCV 91.1 90.3  MCH 31.3 31.9  MCHC 34.3 35.3  RDW 13.5 13.4  PLT 226 278    Thyroid  Recent Labs  Lab 03/03/23 0106  TSH 2.094     BNP Recent Labs  Lab 03/02/23 1957  BNP 894.0*     DDimer  Recent Labs  Lab 03/02/23 1957  DDIMER 1.60*      Radiology  CT Angio Chest PE W and/or Wo Contrast  Result Date: 03/02/2023 CLINICAL DATA:  Shortness of breath, tachypnea, tachycardia, concern for pulmonary embolism. EXAM: CT ANGIOGRAPHY CHEST WITH CONTRAST TECHNIQUE: Multidetector CT imaging of the chest was performed using the standard protocol during bolus administration of intravenous contrast. Multiplanar CT image reconstructions and MIPs were obtained to evaluate the vascular anatomy. RADIATION DOSE REDUCTION: This exam was performed according to the departmental dose-optimization program which includes automated exposure control, adjustment of the mA and/or kV according to patient size and/or use of iterative reconstruction technique. CONTRAST:  47mL OMNIPAQUE IOHEXOL 350 MG/ML SOLN COMPARISON:  Same day chest radiograph and CT chest dated 01/23/2019. FINDINGS: Cardiovascular: Satisfactory opacification of the pulmonary arteries to the segmental level. No evidence of pulmonary embolism. The right and left pulmonary arteries are enlarged, measuring 2.6 cm and 2.5 cm respectively, suggestive of  pulmonary hypertension. Vascular calcifications are seen in the coronary arteries and aortic arch. The heart is enlarged. No pericardial effusion. Mediastinum/Nodes: No enlarged mediastinal, hilar, or axillary lymph nodes. Thyroid gland, trachea, and esophagus demonstrate no significant findings. Bilateral breast implants are noted. Lungs/Pleura: There is a moderate right and small left pleural effusion with associated atelectasis. There is mild atelectasis/scarring in the right middle lobe and the lingula. There is no pneumothorax. Upper Abdomen: No acute abnormality. Musculoskeletal: No chest wall abnormality. No acute or significant osseous findings. Review of the MIP images confirms the above findings. IMPRESSION: 1. No evidence of pulmonary embolism. 2. Moderate right and small left pleural effusions with associated atelectasis. 3. Cardiomegaly and findings suggestive of pulmonary hypertension. Aortic Atherosclerosis (ICD10-I70.0). Electronically Signed   By: Zerita Boers M.D.   On: 03/02/2023 21:41   DG Chest Portable 1 View  Result Date: 03/02/2023 CLINICAL DATA:  Cough, shortness of breath EXAM: PORTABLE CHEST 1 VIEW COMPARISON:  10/12/2015 FINDINGS: There is hyperinflation of the lungs compatible with COPD. Cardiomegaly, aortic atherosclerosis. Airspace opacity in the right lung base with small right pleural effusion. No confluent opacity on the left. Biapical scarring. No acute bony abnormality. IMPRESSION: COPD/chronic changes. Cardiomegaly. Right lower lobe airspace opacity with small right effusion. This could reflect pneumonia. Electronically Signed   By: Rolm Baptise M.D.   On: 03/02/2023 20:10    Cardiac Studies  Pending FU TTE  TTE 04/30/2022 1. Left ventricular ejection fraction, by estimation, is 55 to 60%. The  left ventricle has normal function. The left ventricle has no regional  wall motion abnormalities. Left ventricular diastolic parameters are  consistent with Grade II  diastolic  dysfunction (pseudonormalization).   2. Right ventricular systolic function is normal. The right ventricular  size is moderately enlarged. There is mildly elevated pulmonary artery  systolic pressure. The estimated right ventricular systolic pressure is  123456 mmHg.   3. Left atrial size was mildly dilated.   4. Right atrial size was mildly dilated.   5. The mitral valve is grossly normal. Mild mitral valve regurgitation.  No evidence of mitral stenosis.   6. The tricuspid valve is abnormal. Tricuspid valve regurgitation is  moderate to severe.   7. The aortic valve is calcified. Aortic valve regurgitation is moderate.   8. The inferior vena cava is normal in size with greater than 50%  respiratory variability, suggesting right atrial pressure of 3 mmHg.   Comparison(s): Changes from prior study are noted. EF unchanged. AI is now  moderate. TR still moderate to severe.    CHA2DS2-VASc Score = 5   This indicates a  7.2% annual risk of stroke. The patient's score is based upon: CHF History: 1 HTN History: 1 Diabetes History: 0 Stroke History: 0 Vascular Disease History: 0 Age Score: 2 Gender Score: 1      Patient Profile     Sarah Lewis is a 87 y.o. female with PMHx HTN, Noted hx of ILD -noted scarring in the R middle lobe and the lingula, BRCA s/p BiL mastectomy (2016), pulmonary hypertension, and valvular heart disease including moderate-severe TR, moderate aortic regurgitation, bileaflet MVP with mild MR who is being seen for the evaluation of AFRVR and volume overload   Assessment & Plan    Acute on chronic diastolic heart failure: Presented with shortness of breath.  Volume overloaded on exam.  BNP 894.  Echo shows EF 55-60%, normal RV function, severe BAE, moderate TR -She is on p.o. Lasix.  Remains hypervolemic on exam. Will switch to IV Lasix 40 mg twice daily -Strict I's/O's and daily weights  Atrial fibrillation: New diagnosis, presented with A-fib  with RVR.  CHA2DS2-VASc score 4 (age x 2, hypertension, female) -Continue Toprol-XL 100 mg daily.  Rates appear well controlled, would wean off diltiazem gtt -Continue Eliquis 2.5 mg twice daily (reduced dose given age, weight) -Plan TEE/DCCV to restore sinus rhythm given new symptomatic Afib.  May need antiarrythmic given severe BAE on echo.  After careful review of history and examination, the risks and benefits of transesophageal echocardiogram have been explained including risks of esophageal damage, perforation (1:10,000 risk), bleeding, pharyngeal hematoma as well as other potential complications associated with conscious sedation including aspiration, arrhythmia, respiratory failure and death. Alternatives to treatment were discussed, questions were answered. Patient is willing to proceed.   Acute respiratory failure with hypoxia: Likely multifactorial due to acute on chronic diastolic heart failure and ILD   For questions or updates, please contact [place] Please consult [email or web address] for contact info under        Signed, Donato Heinz, MD  03/04/2023, 8:36 AM

## 2023-03-04 NOTE — Discharge Instructions (Signed)

## 2023-03-05 ENCOUNTER — Encounter (HOSPITAL_COMMUNITY)
Admission: EM | Disposition: A | Discharge status: Home / Self Care | Payer: Self-pay | Source: Home / Self Care | Attending: Student

## 2023-03-05 ENCOUNTER — Inpatient Hospital Stay (HOSPITAL_COMMUNITY): Payer: Medicare PPO

## 2023-03-05 ENCOUNTER — Inpatient Hospital Stay (HOSPITAL_COMMUNITY): Payer: Medicare PPO | Admitting: Certified Registered"

## 2023-03-05 DIAGNOSIS — I10 Essential (primary) hypertension: Secondary | ICD-10-CM

## 2023-03-05 DIAGNOSIS — I38 Endocarditis, valve unspecified: Secondary | ICD-10-CM

## 2023-03-05 DIAGNOSIS — R636 Underweight: Secondary | ICD-10-CM

## 2023-03-05 DIAGNOSIS — C50511 Malignant neoplasm of lower-outer quadrant of right female breast: Secondary | ICD-10-CM | POA: Diagnosis not present

## 2023-03-05 DIAGNOSIS — J449 Chronic obstructive pulmonary disease, unspecified: Secondary | ICD-10-CM | POA: Diagnosis not present

## 2023-03-05 DIAGNOSIS — I5031 Acute diastolic (congestive) heart failure: Secondary | ICD-10-CM | POA: Diagnosis not present

## 2023-03-05 DIAGNOSIS — E876 Hypokalemia: Secondary | ICD-10-CM

## 2023-03-05 DIAGNOSIS — I083 Combined rheumatic disorders of mitral, aortic and tricuspid valves: Secondary | ICD-10-CM

## 2023-03-05 DIAGNOSIS — I4891 Unspecified atrial fibrillation: Secondary | ICD-10-CM | POA: Diagnosis not present

## 2023-03-05 DIAGNOSIS — I361 Nonrheumatic tricuspid (valve) insufficiency: Secondary | ICD-10-CM

## 2023-03-05 DIAGNOSIS — J9601 Acute respiratory failure with hypoxia: Secondary | ICD-10-CM | POA: Diagnosis not present

## 2023-03-05 DIAGNOSIS — D649 Anemia, unspecified: Secondary | ICD-10-CM

## 2023-03-05 DIAGNOSIS — I340 Nonrheumatic mitral (valve) insufficiency: Secondary | ICD-10-CM

## 2023-03-05 HISTORY — PX: CARDIOVERSION: SHX1299

## 2023-03-05 HISTORY — PX: TEE WITHOUT CARDIOVERSION: SHX5443

## 2023-03-05 LAB — RENAL FUNCTION PANEL
Albumin: 3.1 g/dL — ABNORMAL LOW (ref 3.5–5.0)
Anion gap: 9 (ref 5–15)
BUN: 26 mg/dL — ABNORMAL HIGH (ref 8–23)
CO2: 26 mmol/L (ref 22–32)
Calcium: 7.6 mg/dL — ABNORMAL LOW (ref 8.9–10.3)
Chloride: 96 mmol/L — ABNORMAL LOW (ref 98–111)
Creatinine, Ser: 1.02 mg/dL — ABNORMAL HIGH (ref 0.44–1.00)
GFR, Estimated: 54 mL/min — ABNORMAL LOW (ref >60)
Glucose, Bld: 121 mg/dL — ABNORMAL HIGH (ref 70–99)
Phosphorus: 3.6 mg/dL (ref 2.5–4.6)
Potassium: 3.2 mmol/L — ABNORMAL LOW (ref 3.5–5.1)
Sodium: 131 mmol/L — ABNORMAL LOW (ref 135–145)

## 2023-03-05 LAB — ECHO TEE

## 2023-03-05 LAB — MAGNESIUM: Magnesium: 1.9 mg/dL (ref 1.7–2.4)

## 2023-03-05 SURGERY — ECHOCARDIOGRAM, TRANSESOPHAGEAL
Anesthesia: Monitor Anesthesia Care

## 2023-03-05 MED ORDER — SODIUM CHLORIDE 0.9 % IV SOLN
INTRAVENOUS | Status: AC | PRN
  Administered 2023-03-05: 500 mL via INTRAMUSCULAR

## 2023-03-05 MED ORDER — LIDOCAINE HCL (CARDIAC) PF 100 MG/5ML IV SOSY
PREFILLED_SYRINGE | INTRAVENOUS | Status: DC | PRN
  Administered 2023-03-05: 40 mg via INTRAVENOUS

## 2023-03-05 MED ORDER — EPHEDRINE SULFATE-NACL 50-0.9 MG/10ML-% IV SOSY
PREFILLED_SYRINGE | INTRAVENOUS | Status: DC | PRN
  Administered 2023-03-05: 5 mg via INTRAVENOUS

## 2023-03-05 MED ORDER — MAGNESIUM OXIDE -MG SUPPLEMENT 400 (240 MG) MG PO TABS
400.0000 mg | ORAL_TABLET | Freq: Once | ORAL | Status: AC
  Administered 2023-03-05: 400 mg via ORAL
  Filled 2023-03-05: qty 1

## 2023-03-05 MED ORDER — PROPOFOL 500 MG/50ML IV EMUL
INTRAVENOUS | Status: DC | PRN
  Administered 2023-03-05: 125 ug/kg/min via INTRAVENOUS

## 2023-03-05 MED ORDER — PHENYLEPHRINE 80 MCG/ML (10ML) SYRINGE FOR IV PUSH (FOR BLOOD PRESSURE SUPPORT)
PREFILLED_SYRINGE | INTRAVENOUS | Status: DC | PRN
  Administered 2023-03-05: 80 ug via INTRAVENOUS
  Administered 2023-03-05 (×2): 160 ug via INTRAVENOUS

## 2023-03-05 MED ORDER — FUROSEMIDE 40 MG PO TABS
40.0000 mg | ORAL_TABLET | Freq: Every day | ORAL | Status: DC
  Administered 2023-03-05 – 2023-03-06 (×2): 40 mg via ORAL
  Filled 2023-03-05 (×2): qty 1

## 2023-03-05 MED ORDER — PROPOFOL 10 MG/ML IV BOLUS
INTRAVENOUS | Status: DC | PRN
  Administered 2023-03-05: 20 mg via INTRAVENOUS

## 2023-03-05 MED ORDER — POTASSIUM CHLORIDE CRYS ER 20 MEQ PO TBCR
40.0000 meq | EXTENDED_RELEASE_TABLET | ORAL | Status: AC
  Administered 2023-03-05: 40 meq via ORAL
  Filled 2023-03-05: qty 2

## 2023-03-05 NOTE — Anesthesia Postprocedure Evaluation (Signed)
Anesthesia Post Note  Patient: Sarah Lewis  Procedure(s) Performed: TRANSESOPHAGEAL ECHOCARDIOGRAM (TEE) CARDIOVERSION     Patient location during evaluation: PACU Anesthesia Type: MAC Level of consciousness: awake and alert Pain management: pain level controlled Vital Signs Assessment: post-procedure vital signs reviewed and stable Respiratory status: spontaneous breathing, nonlabored ventilation and respiratory function stable Cardiovascular status: blood pressure returned to baseline and stable Postop Assessment: no apparent nausea or vomiting Anesthetic complications: no   No notable events documented.  Last Vitals:  Vitals:   03/05/23 1247 03/05/23 1322  BP: 107/65 107/61  Pulse: 77 66  Resp: (!) 26 18  Temp:  36.7 C  SpO2: 95% 92%    Last Pain:  Vitals:   03/05/23 1322  TempSrc: Oral  PainSc: 0-No pain                 Lynda Rainwater

## 2023-03-05 NOTE — Progress Notes (Signed)
PROGRESS NOTE  Sarah Lewis [id number] DOB: [birth year]   PCP: Carol Ada, MD  Patient is from: Home.  Independently ambulates at baseline.  DOA: 03/02/2023 LOS: 3  Chief complaints Chief Complaint  Patient presents with   Shortness of Breath     Brief Narrative / Interim history: 88 year old F with PMH of diastolic CHF (G2 DD), mod-sev TVR, aortic insufficiency, BRCA s/p b/l mastectomy and chemo, ILD and bronchiectasis presenting with increased shortness of breath that did not improve with bronchodilators as usual, and admitted with new onset atrial fibrillation with RVR.  HR elevated to 148.  BNP 894.  Negative troponin.  D-dimer 1.6.  CXR concerning for RLL air opacity with small right pleural effusion.  CTA chest negative for PE but moderate right and small left pleural effusion with associated atelectasis, cardiomegaly and finding suggesting PAH.  Patient was started on Cardizem drip, low-dose Eliquis and IV Lasix.  Cardiology consulted.   Patient was diuresed with IV Lasix.  Weaned off Cardizem drip. Underwent successful TEE/DCCV on 3/19. TEE with severe biatrial enlargement, mild MR, mod TR, mild AI, LVEF of 60 to 65% and no LAA thrombus.  Cardiology following  Subjective: Seen and examined earlier this morning before she went for TEE/cardioversion.  No major events overnight of this morning.  No complaints.  Feels well.  Denies shortness of breath, chest pain, palpitation or dizziness.  In A-fib with RVR to 120s.  Objective: Vitals:   03/05/23 1231 03/05/23 1232 03/05/23 1236 03/05/23 1247  BP: 92/80  103/74 107/65  Pulse: 75 76 74 77  Resp: 19 (!) 27 (!) 28 (!) 26  Temp: (!) 97.1 F (36.2 C)     TempSrc: Tympanic     SpO2: 90% 91% 93% 95%  Weight:        Examination:  GENERAL: Sitting in bedside chair.  No apparent distress. HEENT: MMM.  Vision and hearing grossly intact.  NECK: Supple.  No apparent JVD.  RESP:  No IWOB.  Diminished aeration bilaterally  but improved. CVS: Irregular rhythm.  HR to 120s. ABD/GI/GU: BS+. Abd soft, NTND.  MSK/EXT:   No apparent deformity. Moves extremities. No edema.  SKIN: no apparent skin lesion or wound NEURO: Awake and alert. Oriented appropriately.  No apparent focal neuro deficit. PSYCH: Calm. Normal affect.   Procedures:  3/19-successful TEE/DCCV  Microbiology summarized: [id number], influenza and RSV PCR nonreactive  Assessment and plan: Principal Problem:   Atrial fibrillation with rapid ventricular response (HCC) Active Problems:   Rheumatoid arthritis ([place])   Malignant neoplasm of lower-outer quadrant of right female breast ([place])   Osteoporosis   Acute heart failure with preserved ejection fraction (HCC)   Pleural effusion due to CHF (congestive heart failure) ([place])   New onset atrial fibrillation (HCC)   Acute respiratory failure with hypoxia ([place])  New onset Atrial fibrillation with RVR: Unclear etiology but suspect CHF exacerbation.   She is not on diuretics at home.  She is also on albuterol which could trigger arrhythmia.  TSH normal.   -S/p successful TEE/DCCV as above -Off Cardizem drip, on Toprol-XL 100 mg daily, and low-dose Eliquis. -Optimize electrolytes -Cardiology following  Acute HFpEF: TTE with LVEF of 55 to 60%, ?DD, sev LAE and RAE, mod AVR and TVR, and RVSP of 41.2 mmHg.  Appears euvolemic on exam but imaging with moderate right and small left pleural effusion.  BNP elevated to 900.  She is not on diuretics at home.  Started on IV Lasix 40 mg  twice daily.  I&O incomplete but net -2.3 L. Cr stable. -Cardiology on board-now on p.o. Lasix. -Strict intake and output, daily weight, renal functions and electrolytes  Acute respiratory failure with hypoxia: Desaturated to 88% on RA and started on 2 L.  Likely due to the above -Management as above -Wean off oxygen as able -Xopenex as needed -Incentive telemetry/OOB/PT/OT  Valvular disease: TEE with mild MR and AI and mod TR.   -Per cardiology.  Essential hypertension: Normotensive. -Toprol-XL as above. -Continue holding amlodipine.   Rheumatoid arthritis: Stable.  Does not seem to be on meds.ly    Mild Bronchiectasis/Interstitial lung disease: Stable. -Followed by pulmonary out patient    History of BRCA s/p b/l mastectomy /chemo.  In remission.  Hypomagnesemia/hypokalemia -Monitor replenish as appropriate   Underweight Body mass index is 17.61 kg/m. Consult dietitian          DVT prophylaxis:  apixaban (ELIQUIS) tablet 2.5 mg Start: 03/02/23 2200 apixaban (ELIQUIS) tablet 2.5 mg  Code Status: Full code Family Communication: None at bedside Level of care: Telemetry Cardiac Status is: Inpatient Remains inpatient appropriate because: A-fib with RVR, acute CHF and acute respiratory failure   Final disposition: Likely home Consultants:  Cardiology  55 minutes with more than 50% spent in reviewing records, counseling patient/family and coordinating care.   Sch Meds:  Scheduled Meds:  apixaban  2.5 mg Oral BID   furosemide  40 mg Oral Daily   metoprolol succinate  100 mg Oral Daily   potassium chloride  40 mEq Oral Q4H   Continuous Infusions:   PRN Meds:.acetaminophen **OR** acetaminophen, levalbuterol, ondansetron **OR** ondansetron (ZOFRAN) IV, traZODone  Antimicrobials: Anti-infectives (From admission, onward)    None        I have personally reviewed the following labs and images: CBC: Recent Labs  Lab 03/02/23 1957 03/04/23 0035  WBC 9.2 9.9  NEUTROABS 7.1  --   HGB 14.0 13.5  HCT 40.8 38.2  MCV 91.1 90.3  PLT 226 278   BMP &GFR Recent Labs  Lab 03/02/23 1957 03/03/23 0106 03/04/23 0035 03/05/23 0023  NA 136 136 132* 131*  K 4.2 3.7 3.7 3.2*  CL 101 101 93* 96*  CO2 24 25 26 26   GLUCOSE 179* 121* 121* 121*  BUN 24* 20 22 26*  CREATININE 0.94 0.90 1.17* 1.02*  CALCIUM 10.4* 9.6 8.2* 7.6*  MG  --  1.5* 1.9 1.9  PHOS  --  5.1* 4.3 3.6   CrCl cannot  be calculated (Unknown ideal weight.). Liver & Pancreas: Recent Labs  Lab 03/03/23 0106 03/04/23 0035 03/05/23 0023  ALBUMIN 3.7 3.4* 3.1*   No results for input(s): "LIPASE", "AMYLASE" in the last 168 hours. No results for input(s): "AMMONIA" in the last 168 hours. Diabetic: Recent Labs    03/03/23 0106  HGBA1C 5.7*   No results for input(s): "GLUCAP" in the last 168 hours. Cardiac Enzymes: No results for input(s): "CKTOTAL", "CKMB", "CKMBINDEX", "TROPONINI" in the last 168 hours. No results for input(s): "PROBNP" in the last 8760 hours. Coagulation Profile: Recent Labs  Lab 03/04/23 2218  INR 1.3*   Thyroid Function Tests: Recent Labs    03/03/23 0106  TSH 2.094   Lipid Profile: No results for input(s): "CHOL", "HDL", "LDLCALC", "TRIG", "CHOLHDL", "LDLDIRECT" in the last 72 hours. Anemia Panel: No results for input(s): "VITAMINB12", "FOLATE", "FERRITIN", "TIBC", "IRON", "RETICCTPCT" in the last 72 hours. Urine analysis: No results found for: "COLORURINE", "APPEARANCEUR", "LABSPEC", "PHURINE", "GLUCOSEU", "HGBUR", "BILIRUBINUR", "KETONESUR", "PROTEINUR", "UROBILINOGEN", "  NITRITE", "LEUKOCYTESUR" Sepsis Labs: Invalid input(s): "PROCALCITONIN", "LACTICIDVEN"  Microbiology: Recent Results (from the past 240 hour(s))  Resp panel by RT-PCR (RSV, Flu A&B, Covid) Anterior Nasal Swab     Status: None   Collection Time: 03/02/23  7:57 PM   Specimen: Anterior Nasal Swab  Result Value Ref Range Status   SARS Coronavirus 2 by RT PCR NEGATIVE NEGATIVE Final    Comment: (NOTE) SARS-CoV-2 target nucleic acids are NOT DETECTED.  The SARS-CoV-2 RNA is generally detectable in upper respiratory specimens during the acute phase of infection. The lowest concentration of SARS-CoV-2 viral copies this assay can detect is 138 copies/mL. A negative result does not preclude SARS-Cov-2 infection and should not be used as the sole basis for treatment or other patient management  decisions. A negative result may occur with  improper specimen collection/handling, submission of specimen other than nasopharyngeal swab, presence of viral mutation(s) within the areas targeted by this assay, and inadequate number of viral copies(<138 copies/mL). A negative result must be combined with clinical observations, patient history, and epidemiological information. The expected result is Negative.  Fact Sheet for Patients:  EntrepreneurPulse.com.au  Fact Sheet for Healthcare Providers:  IncredibleEmployment.be  This test is no t yet approved or cleared by the [place] FDA and  has been authorized for detection and/or diagnosis of SARS-CoV-2 by FDA under an Emergency Use Authorization (EUA). This EUA will remain  in effect (meaning this test can be used) for the duration of the COVID-19 declaration under Section 564(b)(1) of the Act, 21 U.S.C.section 360bbb-3(b)(1), unless the authorization is terminated  or revoked sooner.       Influenza A by PCR NEGATIVE NEGATIVE Final   Influenza B by PCR NEGATIVE NEGATIVE Final    Comment: (NOTE) The Xpert Xpress SARS-CoV-2/FLU/RSV plus assay is intended as an aid in the diagnosis of influenza from Nasopharyngeal swab specimens and should not be used as a sole basis for treatment. Nasal washings and aspirates are unacceptable for Xpert Xpress SARS-CoV-2/FLU/RSV testing.  Fact Sheet for Patients: EntrepreneurPulse.com.au  Fact Sheet for Healthcare Providers: IncredibleEmployment.be  This test is not yet approved or cleared by the [place] FDA and has been authorized for detection and/or diagnosis of SARS-CoV-2 by FDA under an Emergency Use Authorization (EUA). This EUA will remain in effect (meaning this test can be used) for the duration of the COVID-19 declaration under Section 564(b)(1) of the Act, 21 U.S.C. section 360bbb-3(b)(1), unless the  authorization is terminated or revoked.     Resp Syncytial Virus by PCR NEGATIVE NEGATIVE Final    Comment: (NOTE) Fact Sheet for Patients: EntrepreneurPulse.com.au  Fact Sheet for Healthcare Providers: IncredibleEmployment.be  This test is not yet approved or cleared by the [place] FDA and has been authorized for detection and/or diagnosis of SARS-CoV-2 by FDA under an Emergency Use Authorization (EUA). This EUA will remain in effect (meaning this test can be used) for the duration of the COVID-19 declaration under Section 564(b)(1) of the Act, 21 U.S.C. section 360bbb-3(b)(1), unless the authorization is terminated or revoked.  Performed at [facility], [address] [place], [place] [address]     Radiology Studies: No results found.    Janazia Schreier T. [place]  If 7PM-7AM, please contact night-coverage [email or web address] 03/05/2023, 1:12 PM

## 2023-03-05 NOTE — Progress Notes (Signed)
  Echocardiogram Echocardiogram Transesophageal has been performed.  Sarah Lewis 03/05/2023, 12:39 PM

## 2023-03-05 NOTE — Progress Notes (Addendum)
Rounding Note    Patient Name: Sarah Lewis Date of Encounter: 03/05/2023  [place] Cardiologist: None Dr. Kirk Ruths  Subjective   Incomplete I/Os yesterday, -2.3 L on admission.  BP 111/54 this morning.  Cr stable (0.9> 1.17> 1.02), K 3.2 this AM.  Reports dyspnea improved.  Inpatient Medications    Scheduled Meds:  apixaban  2.5 mg Oral BID   furosemide  40 mg Intravenous BID   magnesium oxide  400 mg Oral Once   metoprolol succinate  100 mg Oral Daily   potassium chloride  40 mEq Oral Q4H   Continuous Infusions:  sodium chloride 20 mL/hr at 03/05/23 0542   PRN Meds: acetaminophen **OR** acetaminophen, levalbuterol, ondansetron **OR** ondansetron (ZOFRAN) IV, traZODone   Vital Signs    Vitals:   03/04/23 1946 03/04/23 2315 03/05/23 0500 03/05/23 0755  BP: 109/69 102/71  (!) 111/54  Pulse: (!) 106 92  (!) 117  Resp: 17 16  17   Temp: (!) 97.4 F (36.3 C) (!) 97.5 F (36.4 C)  (!) 97.4 F (36.3 C)  TempSrc: Oral Oral  Oral  SpO2: 94% 98%  93%  Weight:   40.9 kg     Intake/Output Summary (Last 24 hours) at 03/05/2023 0830 Last data filed at 03/05/2023 0700 Gross per 24 hour  Intake 180 ml  Output 350 ml  Net -170 ml       03/05/2023    5:00 AM 03/03/2023    6:42 AM 04/13/2022    8:10 AM  Last 3 Weights  Weight (lbs) 90 lb 3.2 oz 94 lb 92 lb  Weight (kg) 40.914 kg 42.638 kg 41.731 kg      Telemetry    Afib rates 80-110s - Personally Reviewed  ECG    Afib, rate 103, LVH - Personally Reviewed  Physical Exam   Vitals:   03/04/23 2315 03/05/23 0755  BP: 102/71 (!) 111/54  Pulse: 92 (!) 117  Resp: 16 17  Temp: (!) 97.5 F (36.4 C) (!) 97.4 F (36.3 C)  SpO2: 98% 93%    GEN: No acute distress. Frail  On 2 L O2 Neck: No JVD Cardiac: IRRR, no murmurs, rubs, or gallops.  Respiratory:CTAB GI: Soft, nontender, non-distended  MS: No edema; No deformity. Neuro:  Nonfocal  Psych: Normal affect   Labs    High Sensitivity  Troponin:   Recent Labs  Lab 03/02/23 1957  TROPONINIHS 8      Chemistry Recent Labs  Lab 03/03/23 0106 03/04/23 0035 03/05/23 0023  NA 136 132* 131*  K 3.7 3.7 3.2*  CL 101 93* 96*  CO2 25 26 26   GLUCOSE 121* 121* 121*  BUN 20 22 26*  CREATININE 0.90 1.17* 1.02*  CALCIUM 9.6 8.2* 7.6*  MG 1.5* 1.9 1.9  ALBUMIN 3.7 3.4* 3.1*  GFRNONAA >60 46* 54*  ANIONGAP 10 13 9      Lipids No results for input(s): "CHOL", "TRIG", "HDL", "LABVLDL", "LDLCALC", "CHOLHDL" in the last 168 hours.  Hematology Recent Labs  Lab 03/02/23 1957 03/04/23 0035  WBC 9.2 9.9  RBC 4.48 4.23  HGB 14.0 13.5  HCT 40.8 38.2  MCV 91.1 90.3  MCH 31.3 31.9  MCHC 34.3 35.3  RDW 13.5 13.4  PLT 226 278    Thyroid  Recent Labs  Lab 03/03/23 0106  TSH 2.094     BNP Recent Labs  Lab 03/02/23 1957  BNP 894.0*     DDimer  Recent Labs  Lab 03/02/23  [id number]  DDIMER 1.60*      Radiology    ECHOCARDIOGRAM COMPLETE  Result Date: 03/04/2023    ECHOCARDIOGRAM REPORT   Patient Name:   Sarah Lewis Date of Exam: 03/03/2023 Medical Rec #:  [id number]        Height:       60.0 in Accession #:    [id number]       Weight:       94.0 lb Date of Birth:  09/26/1936       BSA:          1.354 m Patient Age:    87 years         BP:           109/62 mmHg Patient Gender: F                HR:           92 bpm. Exam Location:  Inpatient Procedure: 2D Echo, Cardiac Doppler and Color Doppler Indications:    Atrial Fibrillation I48.91  History:        Patient has prior history of Echocardiogram examinations, most                 recent 04/30/2022. CHF, Aortic Valve Disease, Arrythmias:Atrial                 Fibrillation; Risk Factors:Hypertension. Acute heart failure                 with preserved ejection fraction. Malignant neoplasm of                 lower-outer quadrant of right female breast.  Sonographer:    Alvino Chapel RCS Referring Phys: [id number] [place]  1. Left ventricular ejection  fraction, by estimation, is 55 to 60%. The left ventricle has normal function. The left ventricle has no regional wall motion abnormalities. Left ventricular diastolic parameters are indeterminate.  2. Right ventricular systolic function is normal. The right ventricular size is normal. There is mildly elevated pulmonary artery systolic pressure. The estimated right ventricular systolic pressure is [id number] mmHg.  3. Left atrial size was severely dilated.  4. Right atrial size was severely dilated.  5. Mild mitral valve regurgitation.  6. Tricuspid valve regurgitation is moderate.  7. The aortic valve was not well visualized. There is moderate calcification of the aortic valve. Aortic valve regurgitation is moderate.  8. The inferior vena cava is normal in size with <50% respiratory variability, suggesting right atrial pressure of 8 mmHg. FINDINGS  Left Ventricle: Left ventricular ejection fraction, by estimation, is 55 to 60%. The left ventricle has normal function. The left ventricle has no regional wall motion abnormalities. The left ventricular internal cavity size was normal in size. There is  no left ventricular hypertrophy. Left ventricular diastolic parameters are indeterminate. Right Ventricle: The right ventricular size is normal. Right ventricular systolic function is normal. There is mildly elevated pulmonary artery systolic pressure. The tricuspid regurgitant velocity is 2.88 m/s, and with an assumed right atrial pressure of 8 mmHg, the estimated right ventricular systolic pressure is [id number] mmHg. Left Atrium: Left atrial size was severely dilated. Right Atrium: Right atrial size was severely dilated. Pericardium: There is no evidence of pericardial effusion. Mitral Valve: Mild mitral valve regurgitation. Tricuspid Valve: Tricuspid valve regurgitation is moderate. Aortic Valve: The aortic valve was not well visualized. There is moderate calcification of the aortic valve. Aortic valve regurgitation is moderate.  Aortic  regurgitation PHT measures 492 msec. Pulmonic Valve: Pulmonic valve regurgitation is not visualized. Aorta: The aortic root and ascending aorta are structurally normal, with no evidence of dilitation. Venous: The inferior vena cava is normal in size with less than 50% respiratory variability, suggesting right atrial pressure of 8 mmHg. IAS/Shunts: No atrial level shunt detected by color flow Doppler.  LEFT VENTRICLE PLAX 2D LVIDd:         3.50 cm LVIDs:         2.50 cm LV PW:         0.90 cm LV IVS:        0.90 cm LVOT diam:     1.50 cm LV SV:         32 LV SV Index:   24 LVOT Area:     1.77 cm  RIGHT VENTRICLE TAPSE (M-mode): 1.8 cm LEFT ATRIUM              Index        RIGHT ATRIUM           Index LA diam:        3.80 cm  2.81 cm/m   RA Area:     23.00 cm LA Vol (A2C):   74.5 ml  55.01 ml/m  RA Volume:   73.45 ml  54.24 ml/m LA Vol (A4C):   101.0 ml 74.58 ml/m LA Biplane Vol: 86.8 ml  64.10 ml/m  AORTIC VALVE LVOT Vmax:   89.40 cm/s LVOT Vmean:  60.900 cm/s LVOT VTI:    0.182 m AI PHT:      492 msec  AORTA Ao Root diam: 2.90 cm MITRAL VALVE                TRICUSPID VALVE MV Area (PHT): 4.68 cm     TR Peak grad:   33.2 mmHg MV Decel Time: 162 msec     TR Vmax:        288.00 cm/s MV E velocity: 129.00 cm/s                             SHUNTS                             Systemic VTI:  0.18 m                             Systemic Diam: 1.50 cm Phineas Inches Electronically signed by Phineas Inches Signature Date/Time: 03/04/2023/8:43:14 AM    Final     Cardiac Studies  Pending FU TTE  TTE 04/30/2022 1. Left ventricular ejection fraction, by estimation, is 55 to 60%. The  left ventricle has normal function. The left ventricle has no regional  wall motion abnormalities. Left ventricular diastolic parameters are  consistent with Grade II diastolic  dysfunction (pseudonormalization).   2. Right ventricular systolic function is normal. The right ventricular  size is moderately enlarged. There is mildly  elevated pulmonary artery  systolic pressure. The estimated right ventricular systolic pressure is  123456 mmHg.   3. Left atrial size was mildly dilated.   4. Right atrial size was mildly dilated.   5. The mitral valve is grossly normal. Mild mitral valve regurgitation.  No evidence of mitral stenosis.   6. The tricuspid valve is abnormal. Tricuspid valve regurgitation is  moderate to severe.  7. The aortic valve is calcified. Aortic valve regurgitation is moderate.   8. The inferior vena cava is normal in size with greater than 50%  respiratory variability, suggesting right atrial pressure of 3 mmHg.   Comparison(s): Changes from prior study are noted. EF unchanged. AI is now  moderate. TR still moderate to severe.    CHA2DS2-VASc Score = 5   This indicates a 7.2% annual risk of stroke. The patient's score is based upon: CHF History: 1 HTN History: 1 Diabetes History: 0 Stroke History: 0 Vascular Disease History: 0 Age Score: 2 Gender Score: 1      Patient Profile     Sarah Lewis is a 87 y.o. female with PMHx HTN, Noted hx of ILD -noted scarring in the R middle lobe and the lingula, BRCA s/p BiL mastectomy (2016), pulmonary hypertension, and valvular heart disease including moderate-severe TR, moderate aortic regurgitation, bileaflet MVP with mild MR who is being seen for the evaluation of AFRVR and volume overload   Assessment & Plan    Acute on chronic diastolic heart failure: Presented with shortness of breath.  Volume overloaded on exam.  BNP 894.  Echo shows EF 55-60%, normal RV function, severe BAE, moderate TR -Appear euvolemic, will switch to PO lasix 40 mg daily -Strict I/Os and daily weights  Atrial fibrillation: New diagnosis, presented with A-fib with RVR.  CHA2DS2-VASc score 4 (age x 2, hypertension, female) -Continue Toprol-XL 100 mg daily.   -Continue Eliquis 2.5 mg twice daily (reduced dose given age, weight) -Plan TEE/DCCV to restore sinus rhythm  given new symptomatic Afib.  May need antiarrythmic given severe BAE on echo.  After careful review of history and examination, the risks and benefits of transesophageal echocardiogram have been explained including risks of esophageal damage, perforation (1:10,000 risk), bleeding, pharyngeal hematoma as well as other potential complications associated with conscious sedation including aspiration, arrhythmia, respiratory failure and death. Alternatives to treatment were discussed, questions were answered. Patient is willing to proceed.   Acute respiratory failure with hypoxia: Likely multifactorial due to acute on chronic diastolic heart failure and ILD   For questions or updates, please contact [place] Please consult [email or web address] for contact info under        Signed, Donato Heinz, MD  03/05/2023, 8:30 AM

## 2023-03-05 NOTE — Anesthesia Preprocedure Evaluation (Signed)
Anesthesia Evaluation  Patient identified by MRN, date of birth, ID band Patient awake    Reviewed: Allergy & Precautions, NPO status , Patient's Chart, lab work & pertinent test results  Airway Mallampati: II  TM Distance: >3 FB Neck ROM: Full    Dental no notable dental hx.    Pulmonary COPD   Pulmonary exam normal breath sounds clear to auscultation       Cardiovascular hypertension, Pt. on medications + dysrhythmias Atrial Fibrillation  Rhythm:Regular Rate:Normal     Neuro/Psych  Headaches  negative psych ROS   GI/Hepatic negative GI ROS, Neg liver ROS,,,  Endo/Other  negative endocrine ROS    Renal/GU negative Renal ROS  negative genitourinary   Musculoskeletal  (+) Arthritis , Osteoarthritis,    Abdominal   Peds negative pediatric ROS (+)  Hematology negative hematology ROS (+) Blood dyscrasia, anemia   Anesthesia Other Findings   Reproductive/Obstetrics negative OB ROS                             Anesthesia Physical Anesthesia Plan  ASA: III  Anesthesia Plan: MAC   Post-op Pain Management: Minimal or no pain anticipated   Induction: Intravenous  PONV Risk Score and Plan: 2 and Propofol infusion and Treatment may vary due to age or medical condition  Airway Management Planned: Nasal Cannula  Additional Equipment:   Intra-op Plan:   Post-operative Plan:   Informed Consent: I have reviewed the patients History and Physical, chart, labs and discussed the procedure including the risks, benefits and alternatives for the proposed anesthesia with the patient or authorized representative who has indicated his/her understanding and acceptance.     Dental advisory given  Plan Discussed with: CRNA and Surgeon  Anesthesia Plan Comments:         Anesthesia Quick Evaluation

## 2023-03-05 NOTE — Interval H&P Note (Signed)
History and Physical Interval Note:  03/05/2023 11:56 AM  Sarah Lewis  has presented today for surgery, with the diagnosis of AFIB.  The various methods of treatment have been discussed with the patient and family. After consideration of risks, benefits and other options for treatment, the patient has consented to  Procedure(s): TRANSESOPHAGEAL ECHOCARDIOGRAM (TEE) (N/A) CARDIOVERSION (N/A) as a surgical intervention.  The patient's history has been reviewed, patient examined, no change in status, stable for surgery.  I have reviewed the patient's chart and labs.  Questions were answered to the patient's satisfaction.     Pixie Casino

## 2023-03-05 NOTE — CV Procedure (Signed)
TEE/CARDIOVERSION NOTE  TRANSESOPHAGEAL ECHOCARDIOGRAM (TEE):  Indictation: Atrial Fibrillation  Consent:   Informed consent was obtained prior to the procedure. The risks, benefits and alternatives for the procedure were discussed and the patient comprehended these risks.  Risks include, but are not limited to, cough, sore throat, vomiting, nausea, somnolence, esophageal and stomach trauma or perforation, bleeding, low blood pressure, aspiration, pneumonia, infection, trauma to the teeth and death.    Time Out: Verified patient identification, verified procedure, site/side was marked, verified correct patient position, special equipment/implants available, medications/allergies/relevent history reviewed, required imaging and test results available. Performed  Procedure:  After a procedural time-out, the patient was given propofol per anesthesia for sedation. See their separate report for details. The patient's heart rate, blood pressure, and oxygen saturation are monitored continuously during the procedure. The oropharynx was anesthetized with topical cetacaine.  The transesophageal probe was inserted in the esophagus and stomach without difficulty and multiple views were obtained. Agitated microbubble saline contrast was not administered.  Complications:    Complications: None Patient did tolerate procedure well.  Findings:  LEFT VENTRICLE: The left ventricular wall thickness is normal.  The left ventricular cavity is normal in size. Wall motion is normal.  LVEF is 60-65%.  RIGHT VENTRICLE:  The right ventricle is normal in structure and function without any thrombus or masses.    LEFT ATRIUM:  The left atrium is severely dilated in size without any thrombus or masses.  There is not spontaneous echo contrast ("smoke") in the left atrium consistent with a low flow state.  LEFT ATRIAL APPENDAGE:  The left atrial appendage is free of any thrombus or masses. The appendage has single  lobes. Pulse doppler indicates low flow in the appendage.  ATRIAL SEPTUM:  The atrial septum appears intact and is free of thrombus and/or masses.  There is no evidence for interatrial shunting by color doppler and saline microbubble.  RIGHT ATRIUM:  The right atrium is severely dilated in size and function without any thrombus or masses.  MITRAL VALVE:  The mitral valve is normal in structure and function with Mild regurgitation.  There were no vegetations or stenosis.  AORTIC VALVE:  The aortic valve is trileaflet and sclerotic with Mild central regurgitation.  There were no vegetations or stenosis  TRICUSPID VALVE:  The tricuspid valve is normal in structure and function with Moderate regurgitation.  There were no vegetations or stenosis   PULMONIC VALVE:  The pulmonic valve is normal in structure and function with  trivial  regurgitation.  There were no vegetations or stenosis.   AORTIC ARCH, ASCENDING AND DESCENDING AORTA:  There was grade 1 Ron Parker et. Al, 1992) atherosclerosis of the ascending aorta, aortic arch, or proximal descending aorta.  12. PULMONARY VEINS: Anomalous pulmonary venous return was not noted.  13. PERICARDIUM: The pericardium appeared normal and non-thickened.  There is no pericardial effusion.  CARDIOVERSION:     Second Time Out: Verified patient identification, verified procedure, site/side was marked, verified correct patient position, special equipment/implants available, medications/allergies/relevent history reviewed, required imaging and test results available.  Performed  Procedure:  Patient placed on cardiac monitor, pulse oximetry, supplemental oxygen as necessary.  Sedation administered per anesthesia Pacer pads placed anterior and posterior chest. Cardioverted 1 time(s).  Cardioverted at 150J biphasic.  Complications:  Complications: None Patient did tolerate procedure well.  Impression:  No LAA thrombus Severe biatrial enlargement Mild  MR, moderate TR, mild AI LVEF 60-65% Successful DCCV with a single 150J biphasic shock  to NSR.  Recommendations:   Further management recommendations per inpatient cardiology service.  Time Spent Directly with the Patient:  45 minutes   Pixie Casino, MD, [facility], [place] Director of the [facility]  [facility] Diplomate of the American Board of Clinical Lipidology Attending Cardiologist  Direct Dial: [phone]  Fax: [phone]  Website:  [email or web address] 03/05/2023, 12:32 PM

## 2023-03-05 NOTE — Transfer of Care (Signed)
Immediate Anesthesia Transfer of Care Note  Patient: Sarah Lewis  Procedure(s) Performed: TRANSESOPHAGEAL ECHOCARDIOGRAM (TEE) CARDIOVERSION  Patient Location: PACU and Endoscopy Unit  Anesthesia Type:MAC  Level of Consciousness: drowsy and patient cooperative  Airway & Oxygen Therapy: Patient Spontanous Breathing and Patient connected to nasal cannula oxygen  Post-op Assessment: Report given to RN and Post -op Vital signs reviewed and stable  Post vital signs: Reviewed and stable  Last Vitals:  Vitals Value Taken Time  BP 92/80 03/05/23 1231  Temp 36.2 C 03/05/23 1231  Pulse 75 03/05/23 1239  Resp 19 03/05/23 1239  SpO2 94 % 03/05/23 1239  Vitals shown include unvalidated device data.  Last Pain:  Vitals:   03/05/23 1231  TempSrc: Tympanic  PainSc: 0-No pain         Complications: No notable events documented.

## 2023-03-05 NOTE — Care Management Important Message (Signed)
Important Message  Patient Details  Name: LAURENMARIE MYLIN MRN: [id number] Date of Birth: 1936-10-11   Medicare Important Message Given:  Yes     Shelda Altes 03/05/2023, 8:35 AM

## 2023-03-06 ENCOUNTER — Other Ambulatory Visit (HOSPITAL_COMMUNITY): Payer: Self-pay

## 2023-03-06 ENCOUNTER — Encounter (HOSPITAL_COMMUNITY): Payer: Self-pay | Admitting: Internal Medicine

## 2023-03-06 DIAGNOSIS — M069 Rheumatoid arthritis, unspecified: Secondary | ICD-10-CM | POA: Diagnosis not present

## 2023-03-06 DIAGNOSIS — I4891 Unspecified atrial fibrillation: Secondary | ICD-10-CM | POA: Diagnosis not present

## 2023-03-06 DIAGNOSIS — J9601 Acute respiratory failure with hypoxia: Secondary | ICD-10-CM | POA: Diagnosis not present

## 2023-03-06 DIAGNOSIS — I509 Heart failure, unspecified: Secondary | ICD-10-CM | POA: Diagnosis not present

## 2023-03-06 LAB — RENAL FUNCTION PANEL
Albumin: 3 g/dL — ABNORMAL LOW (ref 3.5–5.0)
Anion gap: 9 (ref 5–15)
BUN: 19 mg/dL (ref 8–23)
CO2: 25 mmol/L (ref 22–32)
Calcium: 7.3 mg/dL — ABNORMAL LOW (ref 8.9–10.3)
Chloride: 100 mmol/L (ref 98–111)
Creatinine, Ser: 0.92 mg/dL (ref 0.44–1.00)
GFR, Estimated: >60 mL/min (ref >60)
Glucose, Bld: 100 mg/dL — ABNORMAL HIGH (ref 70–99)
Phosphorus: 2.7 mg/dL (ref 2.5–4.6)
Potassium: 4.2 mmol/L (ref 3.5–5.1)
Sodium: 134 mmol/L — ABNORMAL LOW (ref 135–145)

## 2023-03-06 LAB — CBC
HCT: 37.5 % (ref 36.0–46.0)
Hemoglobin: 12.3 g/dL (ref 12.0–15.0)
MCH: 30.8 pg (ref 26.0–34.0)
MCHC: 32.8 g/dL (ref 30.0–36.0)
MCV: 93.8 fL (ref 80.0–100.0)
Platelets: 253 10*3/uL (ref 150–400)
RBC: 4 MIL/uL (ref 3.87–5.11)
RDW: 13.5 % (ref 11.5–15.5)
WBC: 7 10*3/uL (ref 4.0–10.5)
nRBC: 0 % (ref 0.0–0.2)

## 2023-03-06 LAB — MAGNESIUM: Magnesium: 1.9 mg/dL (ref 1.7–2.4)

## 2023-03-06 MED ORDER — APIXABAN 2.5 MG PO TABS
2.5000 mg | ORAL_TABLET | Freq: Two times a day (BID) | ORAL | 2 refills | Status: AC
  Filled 2023-03-06: qty 60, 30d supply, fill #0

## 2023-03-06 MED ORDER — FUROSEMIDE 40 MG PO TABS
40.0000 mg | ORAL_TABLET | Freq: Every day | ORAL | 1 refills | Status: DC
  Filled 2023-03-06: qty 90, 90d supply, fill #0

## 2023-03-06 NOTE — Progress Notes (Signed)
Rounding Note    Patient Name: Sarah Lewis Date of Encounter: 03/06/2023  [place] Cardiologist: None Dr. Kirk Ruths  Subjective   BP 118/64 this morning.  Denies any chest pain or dyspnea  Inpatient Medications    Scheduled Meds:  apixaban  2.5 mg Oral BID   furosemide  40 mg Oral Daily   metoprolol succinate  100 mg Oral Daily   Continuous Infusions:   PRN Meds: acetaminophen **OR** acetaminophen, levalbuterol, ondansetron **OR** ondansetron (ZOFRAN) IV, traZODone   Vital Signs    Vitals:   03/06/23 0842 03/06/23 0855 03/06/23 0900 03/06/23 0931  BP: 118/64     Pulse: 69     Resp: 18     Temp: 97.6 F (36.4 C)     TempSrc: Oral     SpO2: 96% 93% 93% 96%  Weight:        Intake/Output Summary (Last 24 hours) at 03/06/2023 1006 Last data filed at 03/06/2023 0827 Gross per 24 hour  Intake 570 ml  Output --  Net 570 ml       03/06/2023    4:38 AM 03/05/2023   10:24 AM 03/05/2023    5:00 AM  Last 3 Weights  Weight (lbs) 90 lb 13.3 oz 90 lb 2.7 oz 90 lb 3.2 oz  Weight (kg) 41.2 kg 40.9 kg 40.914 kg      Telemetry    Afib rates 80-110s - Personally Reviewed  ECG    Afib, rate 103, LVH - Personally Reviewed  Physical Exam   Vitals:   03/06/23 0900 03/06/23 0931  BP:    Pulse:    Resp:    Temp:    SpO2: 93% 96%    GEN: No acute distress.  Neck: No JVD Cardiac: RRR, no murmurs, rubs, or gallops.  Respiratory:CTAB GI: Soft, nontender, non-distended  MS: No edema; No deformity. Neuro:  Nonfocal  Psych: Normal affect   Labs    High Sensitivity Troponin:   Recent Labs  Lab 03/02/23 1957  TROPONINIHS 8      Chemistry Recent Labs  Lab 03/04/23 0035 03/05/23 0023 03/06/23 0041  NA 132* 131* 134*  K 3.7 3.2* 4.2  CL 93* 96* 100  CO2 26 26 25   GLUCOSE 121* 121* 100*  BUN 22 26* 19  CREATININE 1.17* 1.02* 0.92  CALCIUM 8.2* 7.6* 7.3*  MG 1.9 1.9 1.9  ALBUMIN 3.4* 3.1* 3.0*  GFRNONAA 46* 54* >60  ANIONGAP  13 9 9      Lipids No results for input(s): "CHOL", "TRIG", "HDL", "LABVLDL", "LDLCALC", "CHOLHDL" in the last 168 hours.  Hematology Recent Labs  Lab 03/02/23 1957 03/04/23 0035 03/06/23 0041  WBC 9.2 9.9 7.0  RBC 4.48 4.23 4.00  HGB 14.0 13.5 12.3  HCT 40.8 38.2 37.5  MCV 91.1 90.3 93.8  MCH 31.3 31.9 30.8  MCHC 34.3 35.3 32.8  RDW 13.5 13.4 13.5  PLT 226 278 253    Thyroid  Recent Labs  Lab 03/03/23 0106  TSH 2.094     BNP Recent Labs  Lab 03/02/23 1957  BNP 894.0*     DDimer  Recent Labs  Lab 03/02/23 1957  DDIMER 1.60*      Radiology    ECHO TEE  Result Date: 03/05/2023    TRANSESOPHOGEAL ECHO REPORT   Patient Name:   Sarah Lewis Date of Exam: 03/05/2023 Medical Rec #:  [id number]        Height:  60.0 in Accession #:    [id number]       Weight:       90.2 lb Date of Birth:  October 26, 1936       BSA:          1.330 m Patient Age:    87 years         BP:           97/53 mmHg Patient Gender: F                HR:           101 bpm. Exam Location:  Inpatient Procedure: Cardiac Doppler, Color Doppler and Transesophageal Echo Indications:     I48.91* Unspeicified atrial fibrillation  History:         Patient has prior history of Echocardiogram examinations, most                  recent 03/03/2023. CHF, Abnormal ECG, Arrythmias:Atrial                  Fibrillation; Signs/Symptoms:Dyspnea and Shortness of Breath.                  Breast cancer.  Sonographer:     Roseanna Rainbow RDCS Referring Phys:  [id number] Margie Billet Diagnosing Phys: Lyman Bishop MD PROCEDURE: After discussion of the risks and benefits of a TEE, an informed consent was obtained from the patient. The transesophogeal probe was passed without difficulty through the esophogus of the patient. Sedation performed by different physician. The patient was monitored while under deep sedation. Anesthestetic sedation was provided intravenously by Anesthesiology: 75mg  of Propofol, 40mg  of Lidocaine. The patient  developed Respiratory depression during the procedure. A successful direct current cardioversion was performed at 150 joules with 1 attempt. Transgastric views not obtained.  IMPRESSIONS  1. Left ventricular ejection fraction, by estimation, is 60 to 65%. The left ventricle has normal function.  2. Right ventricular systolic function is normal. The right ventricular size is normal.  3. Left atrial size was severely dilated. No left atrial/left atrial appendage thrombus was detected.  4. Right atrial size was severely dilated.  5. The mitral valve is abnormal. Mild mitral valve regurgitation.  6. The tricuspid valve is abnormal. Tricuspid valve regurgitation is moderate.  7. The aortic valve is tricuspid. Aortic valve regurgitation is mild. Aortic valve sclerosis is present, with no evidence of aortic valve stenosis. Conclusion(s)/Recommendation(s): No LA/LAA thrombus identified. Successful cardioversion performed with restoration of normal sinus rhythm. FINDINGS  Left Ventricle: Left ventricular ejection fraction, by estimation, is 60 to 65%. The left ventricle has normal function. The left ventricular internal cavity size was normal in size. There is no left ventricular hypertrophy. Right Ventricle: The right ventricular size is normal. No increase in right ventricular wall thickness. Right ventricular systolic function is normal. Left Atrium: Left atrial size was severely dilated. No left atrial/left atrial appendage thrombus was detected. Right Atrium: Right atrial size was severely dilated. Pericardium: There is no evidence of pericardial effusion. Mitral Valve: The mitral valve is abnormal. Mild mitral valve regurgitation. Tricuspid Valve: The tricuspid valve is abnormal. Tricuspid valve regurgitation is moderate. Aortic Valve: The aortic valve is tricuspid. Aortic valve regurgitation is mild. Aortic valve sclerosis is present, with no evidence of aortic valve stenosis. Pulmonic Valve: The pulmonic valve was  grossly normal. Pulmonic valve regurgitation is trivial. Aorta: The aortic root and ascending aorta are structurally normal, with no evidence of dilitation. There is  minimal (Grade I) plaque involving the aortic arch and descending aorta. IAS/Shunts: No atrial level shunt detected by color flow Doppler. Additional Comments: Spectral Doppler performed. Lyman Bishop MD Electronically signed by Lyman Bishop MD Signature Date/Time: 03/05/2023/2:21:19 PM    Final     Cardiac Studies  Pending FU TTE  TTE 04/30/2022 1. Left ventricular ejection fraction, by estimation, is 55 to 60%. The  left ventricle has normal function. The left ventricle has no regional  wall motion abnormalities. Left ventricular diastolic parameters are  consistent with Grade II diastolic  dysfunction (pseudonormalization).   2. Right ventricular systolic function is normal. The right ventricular  size is moderately enlarged. There is mildly elevated pulmonary artery  systolic pressure. The estimated right ventricular systolic pressure is  123456 mmHg.   3. Left atrial size was mildly dilated.   4. Right atrial size was mildly dilated.   5. The mitral valve is grossly normal. Mild mitral valve regurgitation.  No evidence of mitral stenosis.   6. The tricuspid valve is abnormal. Tricuspid valve regurgitation is  moderate to severe.   7. The aortic valve is calcified. Aortic valve regurgitation is moderate.   8. The inferior vena cava is normal in size with greater than 50%  respiratory variability, suggesting right atrial pressure of 3 mmHg.   Comparison(s): Changes from prior study are noted. EF unchanged. AI is now  moderate. TR still moderate to severe.    CHA2DS2-VASc Score = 5   This indicates a 7.2% annual risk of stroke. The patient's score is based upon: CHF History: 1 HTN History: 1 Diabetes History: 0 Stroke History: 0 Vascular Disease History: 0 Age Score: 2 Gender Score: 1      Patient Profile      Sarah Lewis is a 87 y.o. female with PMHx HTN, Noted hx of ILD -noted scarring in the R middle lobe and the lingula, BRCA s/p BiL mastectomy (2016), pulmonary hypertension, and valvular heart disease including moderate-severe TR, moderate aortic regurgitation, bileaflet MVP with mild MR who is being seen for the evaluation of AFRVR and volume overload   Assessment & Plan    Acute on chronic diastolic heart failure: Presented with shortness of breath.  Volume overloaded on exam.  BNP 894.  Echo shows EF 55-60%, normal RV function, severe BAE, moderate TR -Converted to PO lasix 40 mg daily yesterday, appears euvolemic, would continue   Atrial fibrillation: New diagnosis, presented with A-fib with RVR.  CHA2DS2-VASc score 4 (age x 2, hypertension, female) -Continue Toprol-XL 100 mg daily.   -Continue Eliquis 2.5 mg twice daily (reduced dose given age, weight) -Underwent TEE/DCCV 3/19, now in sinus rhythm.  Acute respiratory failure with hypoxia: Likely multifactorial due to acute on chronic diastolic heart failure and ILD  [facility] will sign off.   Medication Recommendations: Eliquis 2.5 mg twice daily, Lasix 40 mg daily, Toprol-XL 100 mg daily Other recommendations (labs, testing, etc): BMET in 1 week Follow up as an outpatient: Will schedule    For questions or updates, please contact [place] Please consult [email or web address] for contact info under        Signed, Donato Heinz, MD  03/06/2023, 10:06 AM

## 2023-03-06 NOTE — Progress Notes (Signed)
Nurse requested Mobility Specialist to perform oxygen saturation test with pt which includes removing pt from oxygen both at rest and while ambulating.  Below are the results from that testing.     Patient Saturations on Room Air at Rest = spO2 95%  Patient Saturations on Room Air while Ambulating = sp02 91% .   Patient Saturations on 0 Liters of oxygen while Ambulating = sp02 91%  At end of testing pt left in room on 0  Liters of oxygen.  Reported results to nurse.   [place] Specialist Please contact via SecureChat or Rehab office at [phone]

## 2023-03-06 NOTE — Discharge Summary (Signed)
Physician Discharge Summary  Sarah Lewis [id number] DOB: May 13, 1936 DOA: 03/02/2023  PCP: Carol Ada, MD  Admit date: 03/02/2023 Discharge date: 03/06/2023 Admitted From: Home Disposition: Home Recommendations for Outpatient Follow-up:  Follow up with PCP in 1 week Check BP, CMP and CBC at follow-up Please follow up on the following pending results: None  Home Health: Not indicated Equipment/Devices: Not indicated  Discharge Condition: Stable CODE STATUS: Full code  Follow-up Information     Carol Ada, MD. Schedule an appointment as soon as possible for a visit in 1 week(s).   Specialty: Family Medicine Contact information: [address] [place] [address] [phone]                 Hospital course 87 year old F with PMH of diastolic CHF (G2 DD), mod-sev TVR, aortic insufficiency, BRCA s/p b/l mastectomy and chemo, ILD and bronchiectasis presenting with increased shortness of breath that did not improve with bronchodilators as usual, and admitted with new onset atrial fibrillation with RVR.  HR elevated to 148.  BNP 894.  Negative troponin.  D-dimer 1.6.  CXR concerning for RLL air opacity with small right pleural effusion.  CTA chest negative for PE but moderate right and small left pleural effusion with associated atelectasis, cardiomegaly and finding suggesting PAH.  Patient was started on Cardizem drip, low-dose Eliquis and IV Lasix.  Cardiology consulted.    Patient was diuresed with IV Lasix.  Weaned off Cardizem drip. Underwent successful TEE/DCCV on 3/19. TEE with severe biatrial enlargement, mild MR, mod TR, mild AI, LVEF of 60 to 65% and no LAA thrombus.  Patient remained in sinus rhythm after cardioversion.  Cleared for discharge by cardiology on Toprol-XL, Eliquis and Lasix.  On the day of discharge, respiratory failure resolved.  Patient was ambulated on room air and maintained saturation above 91% without distress.     See individual problem list below for more.   Problems addressed during this hospitalization Principal Problem:   Atrial fibrillation with rapid ventricular response (HCC) Active Problems:   Rheumatoid arthritis (HCC)   Malignant neoplasm of lower-outer quadrant of right female breast ([facility])   Osteoporosis   Acute heart failure with preserved ejection fraction (HCC)   Pleural effusion due to CHF (congestive heart failure) ([place])   New onset atrial fibrillation (HCC)   Acute respiratory failure with hypoxia ([place])   New onset Atrial fibrillation with RVR: Unclear etiology but suspect CHF exacerbation.  TSH normal.  S/p successful TEE/DCCV on 3/19.  Remained in sinus rhythm. -Discharged on Toprol-XL 100 mg daily, Eliquis 2.5 mg twice daily -Cardiology to arrange outpatient follow-up   Acute HFpEF: TTE with LVEF of 55 to 60%, ?DD, sev LAE and RAE, mod AVR and TVR, and RVSP of 41.2 mmHg.  Appears euvolemic on exam but imaging with moderate right and small left pleural effusion.  BNP elevated to 900.  She is not on diuretics at home.  Started on IV Lasix 40 mg twice daily.  I&O incomplete but net 1.8 L.  Creatinine stable. -Discharged on p.o. Lasix 40 mg daily -Amlodipine discontinued. -Assess fluid status, BP, renal functions and electrolytes at follow-up   Acute respiratory failure with hypoxia: Desaturated to 88% on RA and started on 2 L.  Resolved. -Lasix as above   Valvular disease: TEE with mild MR and AI and mod TR.  -Outpatient follow up with cardiology   Essential hypertension: Normotensive. -Toprol-XL as above. -Amlodipine discontinued in the setting of CHF  Rheumatoid arthritis: Stable.  Does not seem to be on meds.ly    Mild Bronchiectasis/Interstitial lung disease: Stable. -Followed by pulmonary out patient    History of BRCA s/p b/l mastectomy /chemo.  In remission.   Hypomagnesemia/hypokalemia: Resolved   Underweight Body mass index is 17.74 kg/m.             Vital signs Vitals:   03/06/23 0842 03/06/23 0855 03/06/23 0900 03/06/23 0931  BP: 118/64     Pulse: 69     Temp: 97.6 F (36.4 C)     Resp: 18     Weight:      SpO2: 96% 93% 93% 96%  TempSrc: Oral        Discharge exam  GENERAL: Appears frail.  No apparent distress. HEENT: MMM.  Vision and hearing grossly intact.  NECK: Supple.  No apparent JVD.  RESP:  No IWOB.  Fair aeration bilaterally. CVS:  RRR. Heart sounds normal.  ABD/GI/GU: BS+. Abd soft, NTND.  MSK/EXT:  Moves extremities. No apparent deformity. No edema.  SKIN: no apparent skin lesion or wound NEURO: Awake and alert. Oriented appropriately.  No apparent focal neuro deficit. PSYCH: Calm. Normal affect.   Discharge Instructions Discharge Instructions     (HEART FAILURE PATIENTS) Call MD:  Anytime you have any of the following symptoms: 1) 3 pound weight gain in 24 hours or 5 pounds in 1 week 2) shortness of breath, with or without a dry hacking cough 3) swelling in the hands, feet or stomach 4) if you have to sleep on extra pillows at night in order to breathe.   Complete by: As directed    Amb referral to AFIB Clinic   Complete by: As directed    Call MD for:  difficulty breathing, headache or visual disturbances   Complete by: As directed    Call MD for:  extreme fatigue   Complete by: As directed    Call MD for:  persistant dizziness or light-headedness   Complete by: As directed    Diet - low sodium heart healthy   Complete by: As directed    Discharge instructions   Complete by: As directed    It has been a pleasure taking care of you!  You were hospitalized due to atrial fibrillation and heart failure for which you have been treated.  Your heart rhythm is back to normal after cardioversion.  Please take your medications as prescribed.  Reviewed medication list and the directions on your medications before you take them.  Follow-up with your primary care doctor in 1 week.  Follow-up with cardiologist  per the recommendation.  In addition to taking your medications as prescribed, we also recommend you avoid alcohol or over-the-counter pain medication other than plain Tylenol, limit the amount of water/fluid you drink to less than 6 cups (1500 cc) a day,  limit your sodium (salt) intake to less than 2 g (2000 mg) a day and weigh yourself daily at the same time and keeping your weight log.    Take care,   Increase activity slowly   Complete by: As directed       Allergies as of 03/06/2023       Reactions   Amlodipine Benzoate Shortness Of Breath   Losartan Potassium Shortness Of Breath        Medication List     STOP taking these medications    amLODipine 2.5 MG tablet Commonly known as: NORVASC       TAKE  these medications    albuterol (2.5 MG/3ML) 0.083% nebulizer solution Commonly known as: PROVENTIL Take 3 mLs (2.5 mg total) by nebulization every 6 (six) hours as needed for wheezing or shortness of breath.   Calcium-Vitamin D 500-400 MG-UNIT Tabs Take 1 tablet by mouth 2 (two) times daily.   Eliquis 2.5 MG Tabs tablet Generic drug: apixaban Take 1 tablet (2.5 mg total) by mouth 2 (two) times daily.   furosemide 40 MG tablet Commonly known as: LASIX Take 1 tablet (40 mg total) by mouth daily. Start taking on: March 07, 2023   ipratropium 0.02 % nebulizer solution Commonly known as: ATROVENT TAKE 2.5 MLS BY NEBULIZATION EVERY 6 (SIX) HOURS AS NEEDED FOR WHEEZING OR SHORTNESS OF BREATH.   metoprolol succinate 100 MG 24 hr tablet Commonly known as: TOPROL-XL Take 100 mg by mouth daily. Take with or immediately following a meal.   Vitamin D-3 25 MCG (1000 UT) Caps Take 1 capsule by mouth daily.        Consultations: Cardiology  Procedures/Studies: 3/19-TEE cardioversion   ECHO TEE  Result Date: 03/05/2023    TRANSESOPHOGEAL ECHO REPORT   Patient Name:   Sarah Lewis Date of Exam: 03/05/2023 Medical Rec #:  [id number]        Height:       60.0  in Accession #:    [id number]       Weight:       90.2 lb Date of Birth:  [birth year]       BSA:          1.330 m Patient Age:    32 years         BP:           97/53 mmHg Patient Gender: F                HR:           101 bpm. Exam Location:  Inpatient Procedure: Cardiac Doppler, Color Doppler and Transesophageal Echo Indications:     I48.91* Unspeicified atrial fibrillation  History:         Patient has prior history of Echocardiogram examinations, most                  recent 03/03/2023. CHF, Abnormal ECG, Arrythmias:Atrial                  Fibrillation; Signs/Symptoms:Dyspnea and Shortness of Breath.                  Breast cancer.  Sonographer:     Roseanna Rainbow RDCS Referring Phys:  [id number] Margie Billet Diagnosing Phys: Lyman Bishop MD PROCEDURE: After discussion of the risks and benefits of a TEE, an informed consent was obtained from the patient. The transesophogeal probe was passed without difficulty through the esophogus of the patient. Sedation performed by different physician. The patient was monitored while under deep sedation. Anesthestetic sedation was provided intravenously by Anesthesiology: 75mg  of Propofol, 40mg  of Lidocaine. The patient developed Respiratory depression during the procedure. A successful direct current cardioversion was performed at 150 joules with 1 attempt. Transgastric views not obtained.  IMPRESSIONS  1. Left ventricular ejection fraction, by estimation, is 60 to 65%. The left ventricle has normal function.  2. Right ventricular systolic function is normal. The right ventricular size is normal.  3. Left atrial size was severely dilated. No left atrial/left atrial appendage thrombus was detected.  4. Right atrial size was severely dilated.  5.  The mitral valve is abnormal. Mild mitral valve regurgitation.  6. The tricuspid valve is abnormal. Tricuspid valve regurgitation is moderate.  7. The aortic valve is tricuspid. Aortic valve regurgitation is mild. Aortic valve sclerosis  is present, with no evidence of aortic valve stenosis. Conclusion(s)/Recommendation(s): No LA/LAA thrombus identified. Successful cardioversion performed with restoration of normal sinus rhythm. FINDINGS  Left Ventricle: Left ventricular ejection fraction, by estimation, is 60 to 65%. The left ventricle has normal function. The left ventricular internal cavity size was normal in size. There is no left ventricular hypertrophy. Right Ventricle: The right ventricular size is normal. No increase in right ventricular wall thickness. Right ventricular systolic function is normal. Left Atrium: Left atrial size was severely dilated. No left atrial/left atrial appendage thrombus was detected. Right Atrium: Right atrial size was severely dilated. Pericardium: There is no evidence of pericardial effusion. Mitral Valve: The mitral valve is abnormal. Mild mitral valve regurgitation. Tricuspid Valve: The tricuspid valve is abnormal. Tricuspid valve regurgitation is moderate. Aortic Valve: The aortic valve is tricuspid. Aortic valve regurgitation is mild. Aortic valve sclerosis is present, with no evidence of aortic valve stenosis. Pulmonic Valve: The pulmonic valve was grossly normal. Pulmonic valve regurgitation is trivial. Aorta: The aortic root and ascending aorta are structurally normal, with no evidence of dilitation. There is minimal (Grade I) plaque involving the aortic arch and descending aorta. IAS/Shunts: No atrial level shunt detected by color flow Doppler. Additional Comments: Spectral Doppler performed. Lyman Bishop MD Electronically signed by Lyman Bishop MD Signature Date/Time: 03/05/2023/2:21:19 PM    Final    ECHOCARDIOGRAM COMPLETE  Result Date: 03/04/2023    ECHOCARDIOGRAM REPORT   Patient Name:   Sarah Lewis Date of Exam: 03/03/2023 Medical Rec #:  [id number]        Height:       60.0 in Accession #:    [id number]       Weight:       94.0 lb Date of Birth:  [birth year]       BSA:          1.354 m  Patient Age:    75 years         BP:           109/62 mmHg Patient Gender: F                HR:           92 bpm. Exam Location:  Inpatient Procedure: 2D Echo, Cardiac Doppler and Color Doppler Indications:    Atrial Fibrillation I48.91  History:        Patient has prior history of Echocardiogram examinations, most                 recent 04/30/2022. CHF, Aortic Valve Disease, Arrythmias:Atrial                 Fibrillation; Risk Factors:Hypertension. Acute heart failure                 with preserved ejection fraction. Malignant neoplasm of                 lower-outer quadrant of right female breast.  Sonographer:    Alvino Chapel RCS Referring Phys: [id number] [place]  1. Left ventricular ejection fraction, by estimation, is 55 to 60%. The left ventricle has normal function. The left ventricle has no regional wall motion abnormalities. Left ventricular diastolic parameters are indeterminate.  2. Right ventricular systolic  function is normal. The right ventricular size is normal. There is mildly elevated pulmonary artery systolic pressure. The estimated right ventricular systolic pressure is [id number] mmHg.  3. Left atrial size was severely dilated.  4. Right atrial size was severely dilated.  5. Mild mitral valve regurgitation.  6. Tricuspid valve regurgitation is moderate.  7. The aortic valve was not well visualized. There is moderate calcification of the aortic valve. Aortic valve regurgitation is moderate.  8. The inferior vena cava is normal in size with <50% respiratory variability, suggesting right atrial pressure of 8 mmHg. FINDINGS  Left Ventricle: Left ventricular ejection fraction, by estimation, is 55 to 60%. The left ventricle has normal function. The left ventricle has no regional wall motion abnormalities. The left ventricular internal cavity size was normal in size. There is  no left ventricular hypertrophy. Left ventricular diastolic parameters are indeterminate. Right Ventricle: The  right ventricular size is normal. Right ventricular systolic function is normal. There is mildly elevated pulmonary artery systolic pressure. The tricuspid regurgitant velocity is 2.88 m/s, and with an assumed right atrial pressure of 8 mmHg, the estimated right ventricular systolic pressure is [id number] mmHg. Left Atrium: Left atrial size was severely dilated. Right Atrium: Right atrial size was severely dilated. Pericardium: There is no evidence of pericardial effusion. Mitral Valve: Mild mitral valve regurgitation. Tricuspid Valve: Tricuspid valve regurgitation is moderate. Aortic Valve: The aortic valve was not well visualized. There is moderate calcification of the aortic valve. Aortic valve regurgitation is moderate. Aortic regurgitation PHT measures 492 msec. Pulmonic Valve: Pulmonic valve regurgitation is not visualized. Aorta: The aortic root and ascending aorta are structurally normal, with no evidence of dilitation. Venous: The inferior vena cava is normal in size with less than 50% respiratory variability, suggesting right atrial pressure of 8 mmHg. IAS/Shunts: No atrial level shunt detected by color flow Doppler.  LEFT VENTRICLE PLAX 2D LVIDd:         3.50 cm LVIDs:         2.50 cm LV PW:         0.90 cm LV IVS:        0.90 cm LVOT diam:     1.50 cm LV SV:         32 LV SV Index:   24 LVOT Area:     1.77 cm  RIGHT VENTRICLE TAPSE (M-mode): 1.8 cm LEFT ATRIUM              Index        RIGHT ATRIUM           Index LA diam:        3.80 cm  2.81 cm/m   RA Area:     23.00 cm LA Vol (A2C):   74.5 ml  55.01 ml/m  RA Volume:   73.45 ml  54.24 ml/m LA Vol (A4C):   101.0 ml 74.58 ml/m LA Biplane Vol: 86.8 ml  64.10 ml/m  AORTIC VALVE LVOT Vmax:   89.40 cm/s LVOT Vmean:  60.900 cm/s LVOT VTI:    0.182 m AI PHT:      492 msec  AORTA Ao Root diam: 2.90 cm MITRAL VALVE                TRICUSPID VALVE MV Area (PHT): 4.68 cm     TR Peak grad:   33.2 mmHg MV Decel Time: 162 msec     TR Vmax:        288.00 cm/s MV E  velocity: 129.00 cm/s                             SHUNTS                             Systemic VTI:  0.18 m                             Systemic Diam: 1.50 cm Phineas Inches Electronically signed by Phineas Inches Signature Date/Time: 03/04/2023/8:43:14 AM    Final    CT Angio Chest PE W and/or Wo Contrast  Result Date: 03/02/2023 CLINICAL DATA:  Shortness of breath, tachypnea, tachycardia, concern for pulmonary embolism. EXAM: CT ANGIOGRAPHY CHEST WITH CONTRAST TECHNIQUE: Multidetector CT imaging of the chest was performed using the standard protocol during bolus administration of intravenous contrast. Multiplanar CT image reconstructions and MIPs were obtained to evaluate the vascular anatomy. RADIATION DOSE REDUCTION: This exam was performed according to the departmental dose-optimization program which includes automated exposure control, adjustment of the mA and/or kV according to patient size and/or use of iterative reconstruction technique. CONTRAST:  69mL OMNIPAQUE IOHEXOL 350 MG/ML SOLN COMPARISON:  Same day chest radiograph and CT chest dated 01/23/2019. FINDINGS: Cardiovascular: Satisfactory opacification of the pulmonary arteries to the segmental level. No evidence of pulmonary embolism. The right and left pulmonary arteries are enlarged, measuring 2.6 cm and 2.5 cm respectively, suggestive of pulmonary hypertension. Vascular calcifications are seen in the coronary arteries and aortic arch. The heart is enlarged. No pericardial effusion. Mediastinum/Nodes: No enlarged mediastinal, hilar, or axillary lymph nodes. Thyroid gland, trachea, and esophagus demonstrate no significant findings. Bilateral breast implants are noted. Lungs/Pleura: There is a moderate right and small left pleural effusion with associated atelectasis. There is mild atelectasis/scarring in the right middle lobe and the lingula. There is no pneumothorax. Upper Abdomen: No acute abnormality. Musculoskeletal: No chest wall abnormality. No  acute or significant osseous findings. Review of the MIP images confirms the above findings. IMPRESSION: 1. No evidence of pulmonary embolism. 2. Moderate right and small left pleural effusions with associated atelectasis. 3. Cardiomegaly and findings suggestive of pulmonary hypertension. Aortic Atherosclerosis (ICD10-I70.0). Electronically Signed   By: Zerita Boers M.D.   On: 03/02/2023 21:41   DG Chest Portable 1 View  Result Date: 03/02/2023 CLINICAL DATA:  Cough, shortness of breath EXAM: PORTABLE CHEST 1 VIEW COMPARISON:  10/12/2015 FINDINGS: There is hyperinflation of the lungs compatible with COPD. Cardiomegaly, aortic atherosclerosis. Airspace opacity in the right lung base with small right pleural effusion. No confluent opacity on the left. Biapical scarring. No acute bony abnormality. IMPRESSION: COPD/chronic changes. Cardiomegaly. Right lower lobe airspace opacity with small right effusion. This could reflect pneumonia. Electronically Signed   By: Rolm Baptise M.D.   On: 03/02/2023 20:10       The results of significant diagnostics from this hospitalization (including imaging, microbiology, ancillary and laboratory) are listed below for reference.     Microbiology: Recent Results (from the past 240 hour(s))  Resp panel by RT-PCR (RSV, Flu A&B, Covid) Anterior Nasal Swab     Status: None   Collection Time: 03/02/23  7:57 PM   Specimen: Anterior Nasal Swab  Result Value Ref Range Status   SARS Coronavirus 2 by RT PCR NEGATIVE NEGATIVE Final    Comment: (NOTE) SARS-CoV-2 target nucleic acids are NOT DETECTED.  The  SARS-CoV-2 RNA is generally detectable in upper respiratory specimens during the acute phase of infection. The lowest concentration of SARS-CoV-2 viral copies this assay can detect is 138 copies/mL. A negative result does not preclude SARS-Cov-2 infection and should not be used as the sole basis for treatment or other patient management decisions. A negative result may  occur with  improper specimen collection/handling, submission of specimen other than nasopharyngeal swab, presence of viral mutation(s) within the areas targeted by this assay, and inadequate number of viral copies(<138 copies/mL). A negative result must be combined with clinical observations, patient history, and epidemiological information. The expected result is Negative.  Fact Sheet for Patients:  EntrepreneurPulse.com.au  Fact Sheet for Healthcare Providers:  IncredibleEmployment.be  This test is no t yet approved or cleared by the [place] FDA and  has been authorized for detection and/or diagnosis of SARS-CoV-2 by FDA under an Emergency Use Authorization (EUA). This EUA will remain  in effect (meaning this test can be used) for the duration of the COVID-19 declaration under Section 564(b)(1) of the Act, 21 U.S.C.section 360bbb-3(b)(1), unless the authorization is terminated  or revoked sooner.       Influenza A by PCR NEGATIVE NEGATIVE Final   Influenza B by PCR NEGATIVE NEGATIVE Final    Comment: (NOTE) The Xpert Xpress SARS-CoV-2/FLU/RSV plus assay is intended as an aid in the diagnosis of influenza from Nasopharyngeal swab specimens and should not be used as a sole basis for treatment. Nasal washings and aspirates are unacceptable for Xpert Xpress SARS-CoV-2/FLU/RSV testing.  Fact Sheet for Patients: EntrepreneurPulse.com.au  Fact Sheet for Healthcare Providers: IncredibleEmployment.be  This test is not yet approved or cleared by the [place] FDA and has been authorized for detection and/or diagnosis of SARS-CoV-2 by FDA under an Emergency Use Authorization (EUA). This EUA will remain in effect (meaning this test can be used) for the duration of the COVID-19 declaration under Section 564(b)(1) of the Act, 21 U.S.C. section 360bbb-3(b)(1), unless the authorization is terminated  or revoked.     Resp Syncytial Virus by PCR NEGATIVE NEGATIVE Final    Comment: (NOTE) Fact Sheet for Patients: EntrepreneurPulse.com.au  Fact Sheet for Healthcare Providers: IncredibleEmployment.be  This test is not yet approved or cleared by the [place] FDA and has been authorized for detection and/or diagnosis of SARS-CoV-2 by FDA under an Emergency Use Authorization (EUA). This EUA will remain in effect (meaning this test can be used) for the duration of the COVID-19 declaration under Section 564(b)(1) of the Act, 21 U.S.C. section 360bbb-3(b)(1), unless the authorization is terminated or revoked.  Performed at [facility], [place], [place], [place] [address]      Labs:  CBC: Recent Labs  Lab 03/02/23 1957 03/04/23 0035 03/06/23 0041  WBC 9.2 9.9 7.0  NEUTROABS 7.1  --   --   HGB 14.0 13.5 12.3  HCT 40.8 38.2 37.5  MCV 91.1 90.3 93.8  PLT 226 278 253   BMP &GFR Recent Labs  Lab 03/02/23 1957 03/03/23 0106 03/04/23 0035 03/05/23 0023 03/06/23 0041  NA 136 136 132* 131* 134*  K 4.2 3.7 3.7 3.2* 4.2  CL 101 101 93* 96* 100  CO2 24 25 26 26 25   GLUCOSE 179* 121* 121* 121* 100*  BUN 24* 20 22 26* 19  CREATININE 0.94 0.90 1.17* 1.02* 0.92  CALCIUM 10.4* 9.6 8.2* 7.6* 7.3*  MG  --  1.5* 1.9 1.9 1.9  PHOS  --  5.1* 4.3 3.6 2.7  CrCl cannot be calculated (Unknown ideal weight.). Liver & Pancreas: Recent Labs  Lab 03/03/23 0106 03/04/23 0035 03/05/23 0023 03/06/23 0041  ALBUMIN 3.7 3.4* 3.1* 3.0*   No results for input(s): "LIPASE", "AMYLASE" in the last 168 hours. No results for input(s): "AMMONIA" in the last 168 hours. Diabetic: No results for input(s): "HGBA1C" in the last 72 hours. No results for input(s): "GLUCAP" in the last 168 hours. Cardiac Enzymes: No results for input(s): "CKTOTAL", "CKMB", "CKMBINDEX", "TROPONINI" in the last 168 hours. No results for input(s):  "PROBNP" in the last 8760 hours. Coagulation Profile: Recent Labs  Lab 03/04/23 2218  INR 1.3*   Thyroid Function Tests: No results for input(s): "TSH", "T4TOTAL", "FREET4", "T3FREE", "THYROIDAB" in the last 72 hours. Lipid Profile: No results for input(s): "CHOL", "HDL", "LDLCALC", "TRIG", "CHOLHDL", "LDLDIRECT" in the last 72 hours. Anemia Panel: No results for input(s): "VITAMINB12", "FOLATE", "FERRITIN", "TIBC", "IRON", "RETICCTPCT" in the last 72 hours. Urine analysis: No results found for: "COLORURINE", "APPEARANCEUR", "LABSPEC", "PHURINE", "GLUCOSEU", "HGBUR", "BILIRUBINUR", "KETONESUR", "PROTEINUR", "UROBILINOGEN", "NITRITE", "LEUKOCYTESUR" Sepsis Labs: Invalid input(s): "PROCALCITONIN", "LACTICIDVEN"   SIGNED:  Mercy Riding, MD  [facility] 03/06/2023, 2:36 PM

## 2023-03-06 NOTE — Progress Notes (Signed)
Heart Failure Navigator Progress Note  Assessed for Heart & Vascular TOC clinic readiness.  Patient EF 55-60%, Has scheduled [facility] follow up appointment on 03/20/2023. .   Navigator available for reassessment of patient.   Earnestine Leys, BSN, Clinical cytogeneticist Only

## 2023-03-06 NOTE — TOC Transition Note (Signed)
Transition of Care [facility]) - CM/SW Discharge Note   Patient Details  Name: Sarah Lewis MRN: [id number] Date of Birth: Aug 30, 1936  Transition of Care [facility]) CM/SW Contact:  Zenon Mayo, RN Phone Number: 03/06/2023, 11:02 AM   Clinical Narrative:    Patient is for dc today, she will be on eliquis, copay is 40.00 , TOC to fill meds for her. She has transport at [facility] by her daughter.         Patient Goals and CMS Choice      Discharge Placement                         Discharge Plan and Services Additional resources added to the After Visit Summary for                                       Social Determinants of Health (SDOH) Interventions SDOH Screenings   Tobacco Use: Low Risk  (03/06/2023)     Readmission Risk Interventions     No data to display

## 2023-03-10 DIAGNOSIS — L270 Generalized skin eruption due to drugs and medicaments taken internally: Secondary | ICD-10-CM | POA: Diagnosis not present

## 2023-03-10 DIAGNOSIS — T501X5A Adverse effect of loop [high-ceiling] diuretics, initial encounter: Secondary | ICD-10-CM | POA: Diagnosis not present

## 2023-03-11 DIAGNOSIS — L270 Generalized skin eruption due to drugs and medicaments taken internally: Secondary | ICD-10-CM | POA: Diagnosis not present

## 2023-03-11 DIAGNOSIS — E46 Unspecified protein-calorie malnutrition: Secondary | ICD-10-CM | POA: Diagnosis not present

## 2023-03-12 ENCOUNTER — Telehealth: Payer: Self-pay | Admitting: Cardiology

## 2023-03-12 NOTE — Telephone Encounter (Signed)
Spoke to patient . She states she was discharge from the hospital 3/20 /24.  She was started  on   Eliquis, Metoprolol , Furosemide   Patient states  she develop a rash and itching  on her back , chest  legs  either l Friday or Saturday after being released from the hospital.  She states she went to  her pharmacist and informed them she was itching really bad and had a rash.  Patient states pharmacist told her not to take furosemide. She states she has not taken Furosemide  since Saturday 03/09/23.  Patient stated she was itching so bad she went to [facility] on Sunday 03/10/23 -  she received  prednisone 20 mg  twice a day for 5 days Famotidine 40 mg daily for 5 days Hydroxyzine three times a day  for 5 days   Today ,She was calling today to inform office. She staes     Patient states she has not been weighing daily or checking her blood pressure since her discharge.  RN  instructed patient  to keep a daily weight log and blood pressure reading and bring  both  into her cardiology appointment on 03/20/23 with Virgie Dad PA also to bring all medications she is taking.   Rn instructed patient to contact office if she gains 3 lbs or more over night or 5 lbs in a week.  Patient verbalized understanding all information.  Patient is aware will defer message to  providers for any  further instructions.

## 2023-03-12 NOTE — Telephone Encounter (Signed)
Pt c/o medication issue:  1. Name of Medication: furosemide (LASIX) 40 MG tablet   2. How are you currently taking this medication (dosage and times per day)? Stopped taking two days ago.   3. Are you having a reaction (difficulty breathing--STAT)? Yes   4. What is your medication issue? Patient believes she has a rash on her stomach area when she started this medication. Requesting return call.

## 2023-03-16 NOTE — Progress Notes (Deleted)
Cardiology Office Note:    Date:  03/16/2023   ID:  Sarah Lewis, DOB May 04, 1936, MRN [id number]  PCP:  Carol Ada, MD  Cardiologist:  Kirk Ruths, MD  Electrophysiologist:  None   Referring MD: Carol Ada, MD   Chief Complaint: hospital follow-up of CHF and atrial fibrillation   History of Present Illness:    Sarah Lewis is a 87 y.o. female with a history of coronary calcifications noted on prior chest CT, moderate aortic insufficiency, moderate tricuspid regurgitation, mild interstitial lung disease and bronchiectasis, hypertension, and breast cancer s/p bilateral mastectomy in 2016 who is followed by Dr. Stanford Breed and presents today for hospital follow-up of CHF and atrial fibrillation.   Patient has been followed by Dr. Stanford Breed since 2019 mainly for routine monitoring of aortic insuffiencey and hypertension. She was last seen by Dr. Stanford Breed in 03/2022 at which time she was doing well from a cardiac standpoint. Repeat Echo was ordered and showed LVEF of 55-60% with grade 2 diastolic dysfunction, moderately enlarged RV with normal function, moderate AI, moderate to severe TR, and mild MR.   She was recently admitted from 03/02/2023 to 03/06/2023 for new onset atrial fibrillation with RVR and CHF. Chest x-ray was concerning for right lower lobe air opacities with small right pleural effusion. Chest CTA was negative for PE but showed moderate right and small left pleural effusion with associated atelectasis, cardiomegaly, and finding suggesting PAH. Echo showed LVEF of 55-60% with normal wall motion, normal RV size and function, severe biatrial enlargement, moderate AI, moderate TR, mild MR, and mildly elevated PASP of 41.2 mmHg. She was diuresed with IV Lasix and started on IV Cardizem and Eliquis. She then underwent successful TEE/DCCV on 03/04/2022 with restoration of sinus rhythm. She was discharged on Lasix 40mg  daily, Toprol-XL 100mg  daily, and Eliquis 2.5mg  twice  daily.  Patient presents today for follow-up. ***  Chronic Diastolic CHF Recently admitted in 02/2023 for CHF and new onset atrial fibrillation. Echo showed LVEF of 55-60% with normal wall motion, normal RV size and function, severe biatrial enlargement, moderate AI, moderate TR, mild MR, and mildly elevated PASP of 41.2 mmHg. - Euvolemic on exam. - Continue Lasix 40mg  daily.  - Continue Toprol-XL 100mg  daily. - Discussed importance of daily weight and sodium/fluid restrictions.   Paroxysmal Atrial Fibrillation Recently admitted for new onset atrial fibrillation. S/p successful TEE/ DCCV on 03/05/2023 with return of sinus rhythm.  - Maintaining sinus rhythm. - Continue Toprol-XL 100mg  daily. - Continue chronic anticoagulation with Eliquis 2.5mg  twice daily. Reduced dose due to age and weight.  Moderate Aortic Insufficiency Moderate Tricuspid Regurgitation Mild Mitral Regurgitation TTE during recent admission showed moderate AI, moderate TR, and mild MR. TEE showed only mild AI, moderate TR, and mild MR.  - Continue routine monitoring as an outpatient. Recommend repeat Echo in 02/2024.  Hypertensin BP well controlled.  - Continue Toprol-XL 100mg  daily.   Past Medical History:  Diagnosis Date   Anemia yrs ago   Aortic insufficiency    Breast cancer of lower-outer quadrant of right female breast ([place]) 10/10/2015   left breat also chemo done for first   Cough    from toporol   Heart murmur    Hypertension    Osteoporosis    Spinal headache yrs ago    Past Surgical History:  Procedure Laterality Date   ABDOMINAL HYSTERECTOMY     total   APPENDECTOMY     BREAST RECONSTRUCTION WITH PLACEMENT OF TISSUE EXPANDER AND  FLEX HD (ACELLULAR HYDRATED DERMIS) Right 12/13/2015   Procedure: RIGHT BREAST RECONSTRUCTION WITH PLACEMENT OF silicone gel implant and allograft tissue;  Surgeon: Crissie Reese, MD;  Location: [place];  Service: Plastics;  Laterality: Right;   BREAST SURGERY      CARDIOVERSION N/A 03/05/2023   Procedure: CARDIOVERSION;  Surgeon: Pixie Casino, MD;  Location: [place];  Service: Cardiovascular;  Laterality: N/A;   CESAREAN SECTION     x 2   COLONOSCOPY WITH PROPOFOL N/A 04/08/2017   Procedure: COLONOSCOPY WITH PROPOFOL;  Surgeon: Garlan Fair, MD;  Location: [facility];  Service: Endoscopy;  Laterality: N/A;   HERNIA REPAIR     MASTECTOMY Left 1979   TEE WITHOUT CARDIOVERSION N/A 03/05/2023   Procedure: TRANSESOPHAGEAL ECHOCARDIOGRAM (TEE);  Surgeon: Pixie Casino, MD;  Location: [place];  Service: Cardiovascular;  Laterality: N/A;   TONSILLECTOMY     TOTAL MASTECTOMY Right 12/13/2015   Procedure: RIGHT MASTECTOMY ;  Surgeon: Stark Klein, MD;  Location: [place];  Service: General;  Laterality: Right;    Current Medications: No outpatient medications have been marked as taking for the 03/20/23 encounter (Appointment) with Darreld Mclean, PA-C.     Allergies:   Amlodipine benzoate and Losartan potassium   Social History   Socioeconomic History   Marital status: Married    Spouse name: Not on file   Number of children: 2   Years of education: Not on file   Highest education level: Not on file  Occupational History   Not on file  Tobacco Use   Smoking status: Never   Smokeless tobacco: Never  Vaping Use   Vaping Use: Never used  Substance and Sexual Activity   Alcohol use: No   Drug use: No   Sexual activity: Not on file  Other Topics Concern   Not on file  Social History Narrative   Not on file   Social Determinants of Health   Financial Resource Strain: Not on file  Food Insecurity: Not on file  Transportation Needs: Not on file  Physical Activity: Not on file  Stress: Not on file  Social Connections: Not on file     Family History: The patient's family history includes Bone cancer in her father; Other in her mother and sister.  ROS:   Please see the history of present illness.     EKGs/Labs/Other  Studies Reviewed:    The following studies were reviewed:  TTE 03/03/2023: Impressions: 1. Left ventricular ejection fraction, by estimation, is 55 to 60%. The  left ventricle has normal function. The left ventricle has no regional  wall motion abnormalities. Left ventricular diastolic parameters are  indeterminate.   2. Right ventricular systolic function is normal. The right ventricular  size is normal. There is mildly elevated pulmonary artery systolic  pressure. The estimated right ventricular systolic pressure is [id number] mmHg.   3. Left atrial size was severely dilated.   4. Right atrial size was severely dilated.   5. Mild mitral valve regurgitation.   6. Tricuspid valve regurgitation is moderate.   7. The aortic valve was not well visualized. There is moderate  calcification of the aortic valve. Aortic valve regurgitation is moderate.   8. The inferior vena cava is normal in size with <50% respiratory  variability, suggesting right atrial pressure of 8 mmHg.  _______________  TEE 03/05/2023: Impressions: 1. Left ventricular ejection fraction, by estimation, is 60 to 65%. The  left ventricle has normal function.   2.  Right ventricular systolic function is normal. The right ventricular  size is normal.   3. Left atrial size was severely dilated. No left atrial/left atrial  appendage thrombus was detected.   4. Right atrial size was severely dilated.   5. The mitral valve is abnormal. Mild mitral valve regurgitation.   6. The tricuspid valve is abnormal. Tricuspid valve regurgitation is  moderate.   7. The aortic valve is tricuspid. Aortic valve regurgitation is mild.  Aortic valve sclerosis is present, with no evidence of aortic valve  stenosis.   Conclusion(s)/Recommendation(s): No LA/LAA thrombus identified. Successful  cardioversion performed with restoration of normal sinus rhythm.    EKG:  EKG ordered today. EKG personally reviewed and demonstrates ***.  Recent  Labs: 03/02/2023: B Natriuretic Peptide 894.0 03/03/2023: TSH 2.094 03/06/2023: BUN 19; Creatinine, Ser 0.92; Hemoglobin 12.3; Magnesium 1.9; Platelets 253; Potassium 4.2; Sodium 134  Recent Lipid Panel No results found for: "CHOL", "TRIG", "HDL", "CHOLHDL", "VLDL", "LDLCALC", "LDLDIRECT"  Physical Exam:    Vital Signs: There were no vitals taken for this visit.    Wt Readings from Last 3 Encounters:  03/06/23 90 lb 13.3 oz (41.2 kg)  04/13/22 92 lb (41.7 kg)  10/30/21 92 lb 6.4 oz (41.9 kg)     General: 87 y.o. female in no acute distress. HEENT: Normocephalic and atraumatic. Sclera clear. EOMs intact. Neck: Supple. No carotid bruits. No JVD. Heart: *** RRR. Distinct S1 and S2. No murmurs, gallops, or rubs. Radial and distal pedal pulses 2+ and equal bilaterally. Lungs: No increased work of breathing. Clear to ausculation bilaterally. No wheezes, rhonchi, or rales.  Abdomen: Soft, non-distended, and non-tender to palpation. Bowel sounds present in all 4 quadrants.  MSK: Normal strength and tone for age. *** Extremities: No lower extremity edema.    Skin: Warm and dry. Neuro: Alert and oriented x3. No focal deficits. Psych: Normal affect. Responds appropriately.   Assessment:    No diagnosis found.  Plan:     Disposition: Follow up in ***   Medication Adjustments/Labs and Tests Ordered: Current medicines are reviewed at length with the patient today.  Concerns regarding medicines are outlined above.  No orders of the defined types were placed in this encounter.  No orders of the defined types were placed in this encounter.   There are no Patient Instructions on file for this visit.   Signed, Darreld Mclean, PA-C  03/16/2023 10:35 AM    [place]

## 2023-03-18 NOTE — Telephone Encounter (Signed)
Thanks for the heads up Sarah Lewis!

## 2023-03-19 ENCOUNTER — Ambulatory Visit: Payer: Medicare PPO | Attending: Nurse Practitioner | Admitting: Nurse Practitioner

## 2023-03-19 ENCOUNTER — Telehealth: Payer: Self-pay | Admitting: Cardiology

## 2023-03-19 ENCOUNTER — Encounter: Payer: Self-pay | Admitting: Nurse Practitioner

## 2023-03-19 VITALS — BP 122/82 | HR 133 | Ht 59.0 in | Wt 95.8 lb

## 2023-03-19 DIAGNOSIS — I351 Nonrheumatic aortic (valve) insufficiency: Secondary | ICD-10-CM | POA: Diagnosis not present

## 2023-03-19 DIAGNOSIS — I10 Essential (primary) hypertension: Secondary | ICD-10-CM | POA: Diagnosis not present

## 2023-03-19 DIAGNOSIS — I071 Rheumatic tricuspid insufficiency: Secondary | ICD-10-CM | POA: Diagnosis not present

## 2023-03-19 DIAGNOSIS — I4819 Other persistent atrial fibrillation: Secondary | ICD-10-CM | POA: Diagnosis not present

## 2023-03-19 DIAGNOSIS — I5032 Chronic diastolic (congestive) heart failure: Secondary | ICD-10-CM | POA: Diagnosis not present

## 2023-03-19 DIAGNOSIS — I340 Nonrheumatic mitral (valve) insufficiency: Secondary | ICD-10-CM

## 2023-03-19 MED ORDER — AMIODARONE HCL 200 MG PO TABS: ORAL_TABLET | ORAL | 3 refills | Status: DC

## 2023-03-19 MED ORDER — METOPROLOL TARTRATE 100 MG PO TABS: 100.0000 mg | ORAL_TABLET | Freq: Two times a day (BID) | ORAL | 3 refills | Status: DC

## 2023-03-19 NOTE — Telephone Encounter (Signed)
Received call from patient c/o elevated HR.  Patient reports she was discharged about a week ago for "afib".  She states the last couple of days she has had increasing SOB and unable to sleep at night.  She states she is unable to sleep due to feeling "wound up".     BP 117/91 HR 112-took metoprolol about 1 hour ago.    Reports HR is typically in the 60s.   Reports now that she is up she feels better, no symptoms at current.   Reports compliance with Eliquis.      Per chart review:   Discharged 3/20-new onset Afib s/p successful TEE/DCCV 3/19, HPpEF, respiratory failure, valvular disease, htn.    Appt scheduled today at 2:20 PM with Diona Browner NP.  Patient is aware if she becomes symptomatic to proceed to ER for evaluation.

## 2023-03-19 NOTE — Progress Notes (Signed)
Office Visit    Patient Name: Sarah Lewis Date of Encounter: 03/19/2023  Primary Care Provider:  Carol Ada, MD Primary Cardiologist:  Kirk Ruths, MD  Chief Complaint   87 y.o. female with a history of coronary calcifications noted on prior chest CT, persistent atrial fibrillation, chronic diastolic heart failure, moderate aortic insufficiency, moderate tricuspid regurgitation, mild interstitial lung disease and bronchiectasis, hypertension, and breast cancer s/p bilateral mastectomy in 2016 who presents today for hospital follow-up related to CHF and atrial fibrillation.   Past Medical History    Past Medical History:  Diagnosis Date   Anemia yrs ago   Aortic insufficiency    Breast cancer of lower-outer quadrant of right female breast 10/10/2015   left breat also chemo done for first   Cough    from toporol   Heart murmur    Hypertension    Osteoporosis    Spinal headache yrs ago   Past Surgical History:  Procedure Laterality Date   ABDOMINAL HYSTERECTOMY     total   APPENDECTOMY     BREAST RECONSTRUCTION WITH PLACEMENT OF TISSUE EXPANDER AND FLEX HD (ACELLULAR HYDRATED DERMIS) Right 12/13/2015   Procedure: RIGHT BREAST RECONSTRUCTION WITH PLACEMENT OF silicone gel implant and allograft tissue;  Surgeon: Crissie Reese, MD;  Location: [place];  Service: Plastics;  Laterality: Right;   BREAST SURGERY     CARDIOVERSION N/A 03/05/2023   Procedure: CARDIOVERSION;  Surgeon: Pixie Casino, MD;  Location: [place];  Service: Cardiovascular;  Laterality: N/A;   CESAREAN SECTION     x 2   COLONOSCOPY WITH PROPOFOL N/A 04/08/2017   Procedure: COLONOSCOPY WITH PROPOFOL;  Surgeon: Garlan Fair, MD;  Location: [facility];  Service: Endoscopy;  Laterality: N/A;   HERNIA REPAIR     MASTECTOMY Left 1979   TEE WITHOUT CARDIOVERSION N/A 03/05/2023   Procedure: TRANSESOPHAGEAL ECHOCARDIOGRAM (TEE);  Surgeon: Pixie Casino, MD;  Location: [place];  Service:  Cardiovascular;  Laterality: N/A;   TONSILLECTOMY     TOTAL MASTECTOMY Right 12/13/2015   Procedure: RIGHT MASTECTOMY ;  Surgeon: Stark Klein, MD;  Location: [place];  Service: General;  Laterality: Right;    Allergies  Allergies  Allergen Reactions   Amlodipine Benzoate Shortness Of Breath   Losartan Potassium Shortness Of Breath     Labs/Other Studies Reviewed    The following studies were reviewed today: TTE 2023/03/27: Impressions: 1. Left ventricular ejection fraction, by estimation, is 55 to 60%. The  left ventricle has normal function. The left ventricle has no regional  wall motion abnormalities. Left ventricular diastolic parameters are  indeterminate.   2. Right ventricular systolic function is normal. The right ventricular  size is normal. There is mildly elevated pulmonary artery systolic  pressure. The estimated right ventricular systolic pressure is [id number] mmHg.   3. Left atrial size was severely dilated.   4. Right atrial size was severely dilated.   5. Mild mitral valve regurgitation.   6. Tricuspid valve regurgitation is moderate.   7. The aortic valve was not well visualized. There is moderate  calcification of the aortic valve. Aortic valve regurgitation is moderate.   8. The inferior vena cava is normal in size with <50% respiratory  variability, suggesting right atrial pressure of 8 mmHg.  _______________   TEE 03/05/2023: Impressions: 1. Left ventricular ejection fraction, by estimation, is 60 to 65%. The  left ventricle has normal function.   2. Right ventricular systolic function is normal. The right  ventricular  size is normal.   3. Left atrial size was severely dilated. No left atrial/left atrial  appendage thrombus was detected.   4. Right atrial size was severely dilated.   5. The mitral valve is abnormal. Mild mitral valve regurgitation.   6. The tricuspid valve is abnormal. Tricuspid valve regurgitation is  moderate.   7. The aortic valve is  tricuspid. Aortic valve regurgitation is mild.  Aortic valve sclerosis is present, with no evidence of aortic valve  stenosis.   Conclusion(s)/Recommendation(s): No LA/LAA thrombus identified. Successful  cardioversion performed with restoration of normal sinus rhythm.     Recent Labs: 03/02/2023: B Natriuretic Peptide 894.0 03/03/2023: TSH 2.094 03/06/2023: BUN 19; Creatinine, Ser 0.92; Hemoglobin 12.3; Magnesium 1.9; Platelets 253; Potassium 4.2; Sodium 134  Recent Lipid Panel No results found for: "CHOL", "TRIG", "HDL", "CHOLHDL", "VLDL", "LDLCALC", "LDLDIRECT"  History of Present Illness   87 y.o. female with the above past medical history including coronary calcifications noted on prior chest CT, persistent atrial fibrillation, chronic diastolic heart failure, moderate aortic insufficiency, moderate tricuspid regurgitation, mild interstitial lung disease and bronchiectasis, hypertension, and breast cancer s/p bilateral mastectomy in 2016.   Patient has been followed by Dr. Stanford Breed since 2019 mainly for routine monitoring of aortic insuffiencey and hypertension. She was last seen by Dr. Stanford Breed in 03/2022 at which time she was doing well from a cardiac standpoint. Repeat Echo was ordered and showed LVEF of 55-60% with grade 2 diastolic dysfunction, moderately enlarged RV with normal function, moderate AI, moderate to severe TR, and mild MR.    She was recently admitted from 03/02/2023 to 03/06/2023 for new onset atrial fibrillation with RVR and CHF. Chest x-ray was concerning for right lower lobe air opacities with small right pleural effusion. Chest CTA was negative for PE but showed moderate right and small left pleural effusion with associated atelectasis, cardiomegaly, and finding suggesting PAH. Echo showed LVEF of 55-60% with normal wall motion, normal RV size and function, severe biatrial enlargement, moderate AI, moderate TR, mild MR, and mildly elevated PASP of 41.2 mmHg. She was  diuresed with IV Lasix and started on IV Cardizem and Eliquis. She then underwent successful TEE/DCCV on 03/04/2022 with restoration of sinus rhythm. She was discharged on Lasix 40mg  daily, Toprol-XL 100mg  daily, and Eliquis 2.5mg  twice daily.  She contacted our office on 03/12/2023 with concern for rash related to oral Lasix.  She stopped taking her Lasix as a result.  She contacted our office again on 03/19/2023 with complaints of elevated heart rate, increased shortness of breath.    She presents today for follow-up.  Since her hospitalization and since she contacted our office she has been stable overall though over the past 48 hours she has noticed elevated heart rate, jitteriness, fatigue, and shortness of breath.  She has not been sleeping well due to these symptoms.  She denies chest pain, palpitations, edema, PND, orthopnea, weight gain.  Home Medications    Current Outpatient Medications  Medication Sig Dispense Refill   albuterol (PROVENTIL) (2.5 MG/3ML) 0.083% nebulizer solution Take 3 mLs (2.5 mg total) by nebulization every 6 (six) hours as needed for wheezing or shortness of breath. 75 mL 12   apixaban (ELIQUIS) 2.5 MG TABS tablet Take 1 tablet (2.5 mg total) by mouth 2 (two) times daily. 60 tablet 2   Calcium Carb-Cholecalciferol (CALCIUM-VITAMIN D) 500-400 MG-UNIT TABS Take 1 tablet by mouth 2 (two) times daily.     Cholecalciferol (VITAMIN D-3) 25 MCG (1000  UT) CAPS Take 1 capsule by mouth daily.     denosumab (PROLIA) 60 MG/ML SOSY injection Inject 60 mg into the skin every 6 (six) months.     ipratropium (ATROVENT) 0.02 % nebulizer solution TAKE 2.5 MLS BY NEBULIZATION EVERY 6 (SIX) HOURS AS NEEDED FOR WHEEZING OR SHORTNESS OF BREATH. 125 mL 6   ipratropium (ATROVENT) 0.02 % nebulizer solution Take 0.25 mg by nebulization daily.     metoprolol succinate (TOPROL-XL) 100 MG 24 hr tablet Take 100 mg by mouth daily. Take with or immediately following a meal.     No current  facility-administered medications for this visit.     Review of Systems    She denies chest pain, palpitations, pnd, orthopnea, n, v, dizziness, syncope, edema, weight gain, or early satiety. All other systems reviewed and are otherwise negative except as noted above.   Physical Exam    VS:  BP 122/82 (BP Location: Right Arm, Patient Position: Sitting, Cuff Size: Normal)   Pulse (!) 133   Ht 4\' 11"  (1.499 m)   Wt 95 lb 12.8 oz (43.5 kg)   SpO2 98%   BMI 19.35 kg/m  GEN: Well nourished, well developed, in no acute distress. HEENT: normal. Neck: Supple, no JVD, carotid bruits, or masses. Cardiac: IRIR, no murmurs, rubs, or gallops. No clubbing, cyanosis, edema.  Radials/DP/PT 2+ and equal bilaterally.  Respiratory:  Respirations regular and unlabored, clear to auscultation bilaterally. GI: Soft, nontender, nondistended, BS + x 4. MS: no deformity or atrophy. Skin: warm and dry, no rash. Neuro:  Strength and sensation are intact. Psych: Normal affect.  Accessory Clinical Findings    ECG personally reviewed by me today - atrial fibrillation, 133 bpm- no acute changes.   Lab Results  Component Value Date   WBC 7.0 03/06/2023   HGB 12.3 03/06/2023   HCT 37.5 03/06/2023   MCV 93.8 03/06/2023   PLT 253 03/06/2023   Lab Results  Component Value Date   CREATININE 0.92 03/06/2023   BUN 19 03/06/2023   NA 134 (L) 03/06/2023   K 4.2 03/06/2023   CL 100 03/06/2023   CO2 25 03/06/2023   Lab Results  Component Value Date   ALT 27 07/20/2020   AST 28 07/20/2020   ALKPHOS 57 07/20/2020   BILITOT 0.8 07/20/2020   No results found for: "CHOL", "HDL", "LDLCALC", "LDLDIRECT", "TRIG", "CHOLHDL"  Lab Results  Component Value Date   HGBA1C 5.7 (H) 03/03/2023    Assessment & Plan    1. Persistent Atrial Fibrillation: Recently admitted for new onset atrial fibrillation. S/p successful TEE/ DCCV on 03/05/2023 with restoration of sinus rhythm. EKG shows atrial fibrillation with RVR.   She notes jitteriness, fatigue, and shortness of breath. She declines ED evaluation. She reports adherence to Eliquis.  She is not a good candidate for amiodarone given history of ILD/bronchiectasis, not ideal candidate for Multaq given diastolic heart failure. Will stop metoprolol succinate and start metoprolol tartrate 100 mg twice daily to attempt better rate control.  Continue to monitor BP/HR report SBP consistently less than 100, HR less than 55 bpm.  Discussed ED precautions.  Will place urgent referral to A-fib clinic. Patient does not seem to be tolerating atrial fibrillation, will likely need to discuss alternative rhythm control strategies given early recurrence of a fib post DCCV. Continue metoprolol as above, Eliquis (she is on reduced dose due to age and weight).   2. Chronic Diastolic CHF: Recently admitted in 02/2023 for CHF and new  onset atrial fibrillation. Echo showed LVEF of 55-60% with normal wall motion, normal RV size and function, severe biatrial enlargement, moderate AI, moderate TR, mild MR, and mildly elevated PASP of 41.2 mmHg. Euvolemic and well compensated on exam.  She is no longer taking Lasix due to rash.  Continue metoprolol as above.   3. Valvular heart disease (Moderate Aortic Insufficiency, Moderate Tricuspid Regurgitation, Mild Mitral Regurgitation): TTE during recent admission showed moderate AI, moderate TR, and mild MR. TEE showed only mild AI, moderate TR, and mild MR. Recommend repeat Echo in 02/2024.   4. Hypertension: BP well controlled.  Continue current antihypertensive regimen.  5. Disposition: Refer to a fib clinic, follow-up in 2-3 months with general cardiology.      Lenna Sciara, NP 03/19/2023, 2:44 PM

## 2023-03-19 NOTE — Patient Instructions (Addendum)
Medication Instructions:  Stop taking Metoprolol Succinate. Start taking Metoprolol Tartrate 100 MG by mouth two times daily.  *If you need a refill on your cardiac medications before your next appointment, please call your pharmacy*   Lab Work: None ordered   Testing/Procedures: None ordered   Follow-Up: At [facility], you and your health needs are our priority.  As part of our continuing mission to provide you with exceptional heart care, we have created designated Provider Care Teams.  These Care Teams include your primary Cardiologist (physician) and Advanced Practice Providers (APPs -  Physician Assistants and Nurse Practitioners) who all work together to provide you with the care you need, when you need it.  We recommend signing up for the patient portal called "MyChart".  Sign up information is provided on this After Visit Summary.  MyChart is used to connect with patients for Virtual Visits (Telemedicine).  Patients are able to view lab/test results, encounter notes, upcoming appointments, etc.  Non-urgent messages can be sent to your provider as well.   To learn more about what you can do with MyChart, go to [email or web address].    Your next appointment:   2-3 month(s)  Provider:   Diona Browner, NP       Other Instructions: Please monitor your blood pressure and heart rate daily, and report to your provider a systolic( Top Number) blood pressure less than 100 or a heart rate lower than 55 beats per minute.

## 2023-03-19 NOTE — Telephone Encounter (Signed)
Patient c/o Palpitations:  High priority if patient c/o lightheadedness, shortness of breath, or chest pain  How long have you had palpitations/irregular HR/ Afib? Are you having the symptoms now? "I've been in Afib a little over a week now, I got out the hospital this past Wednesday and they said is I have any symptoms then to call the doctor and I haven't gotten any sleep at all"  Are you currently experiencing lightheadedness, SOB or CP? "SOB, I'm having trouble breathing"   Do you have a history of afib (atrial fibrillation) or irregular heart rhythm? Irregular heart rhythm  Have you checked your BP or HR? (document readings if available): BP 117/91 HR 112  Are you experiencing any other symptoms? No

## 2023-03-20 ENCOUNTER — Other Ambulatory Visit: Payer: Self-pay

## 2023-03-20 ENCOUNTER — Emergency Department (HOSPITAL_BASED_OUTPATIENT_CLINIC_OR_DEPARTMENT_OTHER): Payer: Medicare PPO | Admitting: Radiology

## 2023-03-20 ENCOUNTER — Ambulatory Visit: Payer: Medicare PPO | Admitting: Student

## 2023-03-20 ENCOUNTER — Encounter (HOSPITAL_BASED_OUTPATIENT_CLINIC_OR_DEPARTMENT_OTHER): Payer: Self-pay

## 2023-03-20 ENCOUNTER — Inpatient Hospital Stay (HOSPITAL_BASED_OUTPATIENT_CLINIC_OR_DEPARTMENT_OTHER)
Admission: EM | Admit: 2023-03-20 | Discharge: 2023-03-25 | DRG: 260 | Disposition: A | Discharge status: Home / Self Care | Payer: Medicare PPO | Attending: Internal Medicine | Admitting: Internal Medicine

## 2023-03-20 DIAGNOSIS — I471 Supraventricular tachycardia, unspecified: Secondary | ICD-10-CM | POA: Diagnosis present

## 2023-03-20 DIAGNOSIS — I4891 Unspecified atrial fibrillation: Secondary | ICD-10-CM

## 2023-03-20 DIAGNOSIS — I5031 Acute diastolic (congestive) heart failure: Secondary | ICD-10-CM | POA: Diagnosis present

## 2023-03-20 DIAGNOSIS — E876 Hypokalemia: Secondary | ICD-10-CM | POA: Diagnosis present

## 2023-03-20 DIAGNOSIS — Z853 Personal history of malignant neoplasm of breast: Secondary | ICD-10-CM

## 2023-03-20 DIAGNOSIS — M810 Age-related osteoporosis without current pathological fracture: Secondary | ICD-10-CM | POA: Diagnosis present

## 2023-03-20 DIAGNOSIS — I951 Orthostatic hypotension: Secondary | ICD-10-CM | POA: Diagnosis present

## 2023-03-20 DIAGNOSIS — I509 Heart failure, unspecified: Secondary | ICD-10-CM | POA: Diagnosis not present

## 2023-03-20 DIAGNOSIS — Z9013 Acquired absence of bilateral breasts and nipples: Secondary | ICD-10-CM

## 2023-03-20 DIAGNOSIS — I5030 Unspecified diastolic (congestive) heart failure: Secondary | ICD-10-CM | POA: Diagnosis not present

## 2023-03-20 DIAGNOSIS — I083 Combined rheumatic disorders of mitral, aortic and tricuspid valves: Secondary | ICD-10-CM | POA: Diagnosis present

## 2023-03-20 DIAGNOSIS — J449 Chronic obstructive pulmonary disease, unspecified: Secondary | ICD-10-CM | POA: Diagnosis not present

## 2023-03-20 DIAGNOSIS — Z9221 Personal history of antineoplastic chemotherapy: Secondary | ICD-10-CM

## 2023-03-20 DIAGNOSIS — J849 Interstitial pulmonary disease, unspecified: Secondary | ICD-10-CM | POA: Diagnosis present

## 2023-03-20 DIAGNOSIS — Z7901 Long term (current) use of anticoagulants: Secondary | ICD-10-CM | POA: Diagnosis not present

## 2023-03-20 DIAGNOSIS — I130 Hypertensive heart and chronic kidney disease with heart failure and stage 1 through stage 4 chronic kidney disease, or unspecified chronic kidney disease: Secondary | ICD-10-CM | POA: Diagnosis present

## 2023-03-20 DIAGNOSIS — N1831 Chronic kidney disease, stage 3a: Secondary | ICD-10-CM | POA: Diagnosis present

## 2023-03-20 DIAGNOSIS — I5033 Acute on chronic diastolic (congestive) heart failure: Secondary | ICD-10-CM | POA: Diagnosis not present

## 2023-03-20 DIAGNOSIS — J90 Pleural effusion, not elsewhere classified: Secondary | ICD-10-CM | POA: Diagnosis not present

## 2023-03-20 DIAGNOSIS — E875 Hyperkalemia: Secondary | ICD-10-CM | POA: Diagnosis present

## 2023-03-20 DIAGNOSIS — R0902 Hypoxemia: Secondary | ICD-10-CM

## 2023-03-20 DIAGNOSIS — Z79899 Other long term (current) drug therapy: Secondary | ICD-10-CM

## 2023-03-20 DIAGNOSIS — I110 Hypertensive heart disease with heart failure: Secondary | ICD-10-CM | POA: Diagnosis not present

## 2023-03-20 DIAGNOSIS — I272 Pulmonary hypertension, unspecified: Secondary | ICD-10-CM | POA: Diagnosis present

## 2023-03-20 DIAGNOSIS — J479 Bronchiectasis, uncomplicated: Secondary | ICD-10-CM | POA: Diagnosis present

## 2023-03-20 DIAGNOSIS — G4700 Insomnia, unspecified: Secondary | ICD-10-CM | POA: Diagnosis present

## 2023-03-20 DIAGNOSIS — Z1152 Encounter for screening for COVID-19: Secondary | ICD-10-CM | POA: Diagnosis not present

## 2023-03-20 DIAGNOSIS — I4819 Other persistent atrial fibrillation: Principal | ICD-10-CM | POA: Diagnosis present

## 2023-03-20 DIAGNOSIS — M069 Rheumatoid arthritis, unspecified: Secondary | ICD-10-CM | POA: Diagnosis present

## 2023-03-20 DIAGNOSIS — E871 Hypo-osmolality and hyponatremia: Secondary | ICD-10-CM | POA: Diagnosis not present

## 2023-03-20 DIAGNOSIS — Z808 Family history of malignant neoplasm of other organs or systems: Secondary | ICD-10-CM

## 2023-03-20 DIAGNOSIS — Z888 Allergy status to other drugs, medicaments and biological substances status: Secondary | ICD-10-CM

## 2023-03-20 DIAGNOSIS — I5032 Chronic diastolic (congestive) heart failure: Secondary | ICD-10-CM

## 2023-03-20 DIAGNOSIS — R54 Age-related physical debility: Secondary | ICD-10-CM | POA: Diagnosis present

## 2023-03-20 DIAGNOSIS — I480 Paroxysmal atrial fibrillation: Secondary | ICD-10-CM | POA: Diagnosis not present

## 2023-03-20 DIAGNOSIS — J9601 Acute respiratory failure with hypoxia: Secondary | ICD-10-CM | POA: Diagnosis not present

## 2023-03-20 DIAGNOSIS — Z90710 Acquired absence of both cervix and uterus: Secondary | ICD-10-CM

## 2023-03-20 DIAGNOSIS — R0602 Shortness of breath: Secondary | ICD-10-CM | POA: Diagnosis not present

## 2023-03-20 LAB — CBC
HCT: 46.2 % — ABNORMAL HIGH (ref 36.0–46.0)
Hemoglobin: 15.7 g/dL — ABNORMAL HIGH (ref 12.0–15.0)
MCH: 30.9 pg (ref 26.0–34.0)
MCHC: 34 g/dL (ref 30.0–36.0)
MCV: 90.9 fL (ref 80.0–100.0)
Platelets: 394 10*3/uL (ref 150–400)
RBC: 5.08 MIL/uL (ref 3.87–5.11)
RDW: 13.5 % (ref 11.5–15.5)
WBC: 10 10*3/uL (ref 4.0–10.5)
nRBC: 0 % (ref 0.0–0.2)

## 2023-03-20 LAB — BASIC METABOLIC PANEL
Anion gap: 10 (ref 5–15)
BUN: 33 mg/dL — ABNORMAL HIGH (ref 8–23)
CO2: 22 mmol/L (ref 22–32)
Calcium: 9.7 mg/dL (ref 8.9–10.3)
Chloride: 98 mmol/L (ref 98–111)
Creatinine, Ser: 1.12 mg/dL — ABNORMAL HIGH (ref 0.44–1.00)
GFR, Estimated: 48 mL/min — ABNORMAL LOW (ref >60)
Glucose, Bld: 156 mg/dL — ABNORMAL HIGH (ref 70–99)
Potassium: 5.3 mmol/L — ABNORMAL HIGH (ref 3.5–5.1)
Sodium: 130 mmol/L — ABNORMAL LOW (ref 135–145)

## 2023-03-20 LAB — BRAIN NATRIURETIC PEPTIDE: B Natriuretic Peptide: 1086.9 pg/mL — ABNORMAL HIGH (ref 0.0–100.0)

## 2023-03-20 MED ORDER — MELATONIN 3 MG PO TABS
3.0000 mg | ORAL_TABLET | Freq: Every evening | ORAL | Status: DC | PRN
  Administered 2023-03-20: 3 mg via ORAL
  Filled 2023-03-20: qty 1

## 2023-03-20 MED ORDER — ACETAMINOPHEN 325 MG PO TABS: 650.0000 mg | ORAL_TABLET | Freq: Four times a day (QID) | ORAL | Status: DC | PRN

## 2023-03-20 MED ORDER — ONDANSETRON HCL 4 MG/2ML IJ SOLN: 4.0000 mg | Freq: Four times a day (QID) | INTRAMUSCULAR | Status: DC | PRN

## 2023-03-20 MED ORDER — ONDANSETRON HCL 4 MG PO TABS: 4.0000 mg | ORAL_TABLET | Freq: Four times a day (QID) | ORAL | Status: DC | PRN

## 2023-03-20 MED ORDER — DILTIAZEM HCL-DEXTROSE 125-5 MG/125ML-% IV SOLN (PREMIX)
5.0000 mg/h | INTRAVENOUS | Status: DC
  Administered 2023-03-20 – 2023-03-22 (×4): 5 mg/h via INTRAVENOUS
  Filled 2023-03-20 (×4): qty 125

## 2023-03-20 MED ORDER — ACETAMINOPHEN 650 MG RE SUPP: 650.0000 mg | Freq: Four times a day (QID) | RECTAL | Status: DC | PRN

## 2023-03-20 MED ORDER — APIXABAN 2.5 MG PO TABS
2.5000 mg | ORAL_TABLET | Freq: Two times a day (BID) | ORAL | Status: DC
  Administered 2023-03-20 – 2023-03-25 (×10): 2.5 mg via ORAL
  Filled 2023-03-20 (×10): qty 1

## 2023-03-20 MED ORDER — DILTIAZEM LOAD VIA INFUSION
10.0000 mg | Freq: Once | INTRAVENOUS | Status: AC
  Administered 2023-03-20: 10 mg via INTRAVENOUS
  Filled 2023-03-20: qty 10

## 2023-03-20 MED ORDER — FUROSEMIDE 10 MG/ML IJ SOLN
40.0000 mg | Freq: Once | INTRAMUSCULAR | Status: AC
  Administered 2023-03-20: 40 mg via INTRAVENOUS
  Filled 2023-03-20: qty 4

## 2023-03-20 MED ORDER — ETOMIDATE 2 MG/ML IV SOLN: 5.0000 mg | Freq: Once | INTRAVENOUS | Status: DC

## 2023-03-20 MED ORDER — FUROSEMIDE 10 MG/ML IJ SOLN
40.0000 mg | Freq: Two times a day (BID) | INTRAMUSCULAR | Status: DC
  Administered 2023-03-21: 40 mg via INTRAVENOUS
  Filled 2023-03-20: qty 4

## 2023-03-20 MED ORDER — POLYETHYLENE GLYCOL 3350 17 G PO PACK: 17.0000 g | PACK | Freq: Every day | ORAL | Status: DC | PRN

## 2023-03-20 MED ORDER — SODIUM CHLORIDE 0.9% FLUSH
3.0000 mL | Freq: Two times a day (BID) | INTRAVENOUS | Status: DC
  Administered 2023-03-20 – 2023-03-25 (×4): 3 mL via INTRAVENOUS

## 2023-03-20 MED ORDER — METOPROLOL SUCCINATE ER 100 MG PO TB24
100.0000 mg | ORAL_TABLET | Freq: Every day | ORAL | Status: DC
  Administered 2023-03-21 – 2023-03-25 (×5): 100 mg via ORAL
  Filled 2023-03-20 (×5): qty 1

## 2023-03-20 NOTE — ED Notes (Signed)
Pulse ox at 99%  O2 decreased to 2l via [place]  Will continue to monitor

## 2023-03-20 NOTE — ED Notes (Signed)
Placed on 3L of oxygen via [place] due to de stating to 87%

## 2023-03-20 NOTE — H&P (Signed)
History and Physical    Sarah Lewis [id number] DOB: 18-Jun-1936 DOA: 03/20/2023  PCP: Carol Ada, MD   Patient coming from: Home   Chief Complaint: SOB, fatigue, palpitations   HPI: Sarah Lewis is a 87 y.o. female with medical history significant for rheumatoid arthritis, breast cancer status postmastectomy, atrial fibrillation on Eliquis, interstitial lung disease, and chronic HFpEF who presents to the emergency department with fatigue, shortness of breath, and palpitations.  Patient was recently admitted to the hospital with acute CHF, acute hypoxic respiratory failure, and new onset atrial fibrillation with rapid rate.  She underwent successful TEE/DCCV on 03/05/2023 and was discharged home with Toprol, Eliquis, and outpatient cardiology follow-up.  She had been doing well following the recent discharge until 2 or 3 days ago when she developed a return of rapid palpitations with fatigue and shortness of breath.  She has no longer been taking Lasix at home due to a rash which is now resolving.    [facility] ED Course: Upon arrival to the ED, patient is found to be afebrile and saturating mid 80s on room air with tachypnea, tachycardia, and stable blood pressure.  EKG demonstrates atrial fibrillation with RVR.  Chest x-ray notable for small right greater than left pleural effusions.  Labs are most notable for BNP 1087, sodium 130, potassium 5.3, BUN 33, and creatinine 1.12.  Patient was given 40 mg IV Lasix, 10 mg IV diltiazem, and was started on diltiazem infusion in the ED.  She was transferred to [facility] for admission.  Review of Systems:  All other systems reviewed and apart from HPI, are negative.  Past Medical History:  Diagnosis Date   Anemia yrs ago   Aortic insufficiency    Breast cancer of lower-outer quadrant of right female breast 10/10/2015   left breat also chemo done for first   Cough    from toporol   Heart murmur    Hypertension     Osteoporosis    Spinal headache yrs ago    Past Surgical History:  Procedure Laterality Date   ABDOMINAL HYSTERECTOMY     total   APPENDECTOMY     BREAST RECONSTRUCTION WITH PLACEMENT OF TISSUE EXPANDER AND FLEX HD (ACELLULAR HYDRATED DERMIS) Right 12/13/2015   Procedure: RIGHT BREAST RECONSTRUCTION WITH PLACEMENT OF silicone gel implant and allograft tissue;  Surgeon: Crissie Reese, MD;  Location: [place];  Service: Plastics;  Laterality: Right;   BREAST SURGERY     CARDIOVERSION N/A 03/05/2023   Procedure: CARDIOVERSION;  Surgeon: Pixie Casino, MD;  Location: [place];  Service: Cardiovascular;  Laterality: N/A;   CESAREAN SECTION     x 2   COLONOSCOPY WITH PROPOFOL N/A 04/08/2017   Procedure: COLONOSCOPY WITH PROPOFOL;  Surgeon: Garlan Fair, MD;  [facility]: [facility];  Service: Endoscopy;  Laterality: N/A;   HERNIA REPAIR     MASTECTOMY Left 1979   TEE WITHOUT CARDIOVERSION N/A 03/05/2023   Procedure: TRANSESOPHAGEAL ECHOCARDIOGRAM (TEE);  Surgeon: Pixie Casino, MD;  Location: [place];  Service: Cardiovascular;  Laterality: N/A;   TONSILLECTOMY     TOTAL MASTECTOMY Right 12/13/2015   Procedure: RIGHT MASTECTOMY ;  Surgeon: Stark Klein, MD;  Location: [place];  Service: General;  Laterality: Right;    Social History:   reports that she has never smoked. She has never used smokeless tobacco. She reports that she does not drink alcohol and does not use drugs.  Allergies  Allergen Reactions   Amlodipine Benzoate  Shortness Of Breath   Losartan Potassium Shortness Of Breath    Family History  Problem Relation Age of Onset   Other Mother        malignant neoplasm   Bone cancer Father        malignant neoplasm   Other Sister        malignant neoplasm     Prior to Admission medications   Medication Sig Start Date End Date Taking? Authorizing Provider  albuterol (PROVENTIL) (2.5 MG/3ML) 0.083% nebulizer solution Take 3 mLs (2.5 mg total) by nebulization  every 6 (six) hours as needed for wheezing or shortness of breath. 09/19/20   Icard, Octavio Graves, DO  apixaban (ELIQUIS) 2.5 MG TABS tablet Take 1 tablet (2.5 mg total) by mouth 2 (two) times daily. 03/06/23   Mercy Riding, MD  Calcium Carb-Cholecalciferol (CALCIUM-VITAMIN D) 500-400 MG-UNIT TABS Take 1 tablet by mouth 2 (two) times daily.    [provider]  Cholecalciferol (VITAMIN D-3) 25 MCG (1000 UT) CAPS Take 1 capsule by mouth daily.    [provider]  denosumab (PROLIA) 60 MG/ML SOSY injection Inject 60 mg into the skin every 6 (six) months. 09/16/17   [provider]  ipratropium (ATROVENT) 0.02 % nebulizer solution TAKE 2.5 MLS BY NEBULIZATION EVERY 6 (SIX) HOURS AS NEEDED FOR WHEEZING OR SHORTNESS OF BREATH. 11/29/22   Icard, Bradley L, DO  ipratropium (ATROVENT) 0.02 % nebulizer solution Take 0.25 mg by nebulization daily.    [provider]  metoprolol succinate (TOPROL-XL) 100 MG 24 hr tablet Take 100 mg by mouth daily. Take with or immediately following a meal.    [provider]  metoprolol tartrate (LOPRESSOR) 100 MG tablet Take 1 tablet (100 mg total) by mouth 2 (two) times daily. 03/19/23   Lenna Sciara, NP    Physical Exam: Vitals:   03/20/23 1800 03/20/23 1845 03/20/23 1900 03/20/23 1949  BP: 125/88 127/89 129/84   Pulse: 80 (!) 50 69   Resp: (!) 26 (!) 22 (!) 32   Temp:    (!) 97 F (36.1 C)  TempSrc:    Temporal  SpO2: 98% 98% 97%   Weight:      Height:         Constitutional: NAD, no pallor or diaphoresis   Eyes: PERTLA, lids and conjunctivae normal ENMT: Mucous membranes are moist. Posterior pharynx clear of any exudate or lesions.   Neck: supple, no masses  Respiratory: no wheezing. No accessory muscle use.  Cardiovascular: Rate ~80 and irregularly irregular. Trace lower leg edema bilaterally.   Abdomen: No distension, no tenderness, soft. Bowel sounds active.  Musculoskeletal: no clubbing / cyanosis. No joint  deformity upper and lower extremities.   Skin: no significant rashes, lesions, ulcers. Warm, dry, well-perfused. Neurologic: CN 2-12 grossly intact. Moving all extremities. Alert and oriented.  Psychiatric: Calm. Cooperative.    Labs and Imaging on Admission: I have personally reviewed following labs and imaging studies  CBC: Recent Labs  Lab 03/20/23 1453  WBC 10.0  HGB 15.7*  HCT 46.2*  MCV 90.9  PLT [id number]   Basic Metabolic Panel: Recent Labs  Lab 03/20/23 1453  NA 130*  K 5.3*  CL 98  CO2 22  GLUCOSE 156*  BUN 33*  CREATININE 1.12*  CALCIUM 9.7   GFR: Estimated Creatinine Clearance: 25.5 mL/min (A) (by C-G formula based on SCr of 1.12 mg/dL (H)). Liver Function Tests: No results for input(s): "AST", "ALT", "ALKPHOS", "BILITOT", "PROT", "ALBUMIN"  in the last 168 hours. No results for input(s): "LIPASE", "AMYLASE" in the last 168 hours. No results for input(s): "AMMONIA" in the last 168 hours. Coagulation Profile: No results for input(s): "INR", "PROTIME" in the last 168 hours. Cardiac Enzymes: No results for input(s): "CKTOTAL", "CKMB", "CKMBINDEX", "TROPONINI" in the last 168 hours. BNP (last 3 results) No results for input(s): "PROBNP" in the last 8760 hours. HbA1C: No results for input(s): "HGBA1C" in the last 72 hours. CBG: No results for input(s): "GLUCAP" in the last 168 hours. Lipid Profile: No results for input(s): "CHOL", "HDL", "LDLCALC", "TRIG", "CHOLHDL", "LDLDIRECT" in the last 72 hours. Thyroid Function Tests: No results for input(s): "TSH", "T4TOTAL", "FREET4", "T3FREE", "THYROIDAB" in the last 72 hours. Anemia Panel: No results for input(s): "VITAMINB12", "FOLATE", "FERRITIN", "TIBC", "IRON", "RETICCTPCT" in the last 72 hours. Urine analysis: No results found for: "COLORURINE", "APPEARANCEUR", "LABSPEC", "PHURINE", "GLUCOSEU", "HGBUR", "BILIRUBINUR", "KETONESUR", "PROTEINUR", "UROBILINOGEN", "NITRITE", "LEUKOCYTESUR" Sepsis  Labs: @LABRCNTIP (procalcitonin:4,lacticidven:4) )No results found for this or any previous visit (from the past 240 hour(s)).   Radiological Exams on Admission: DG Chest 2 View  Result Date: 03/20/2023 CLINICAL DATA:  Shortness of breath for the past week. EXAM: CHEST - 2 VIEW COMPARISON:  CT chest and chest x-ray dated March 02, 2023. FINDINGS: Unchanged cardiomegaly and small right greater than left pleural effusions. Normal pulmonary vascularity. Similar bibasilar scarring. No focal consolidation or pneumothorax. No acute osseous abnormality. IMPRESSION: 1. Unchanged small right greater than left pleural effusions. Electronically Signed   By: Titus Dubin M.D.   On: 03/20/2023 15:22    EKG: Independently reviewed. Atrial fibrillation with RVR, rate 131.   Assessment/Plan   1. Atrial fibrillation with RVR  - S/p TEE/DCCV on 03/05/23, now back in rapid a fib with acute CHF  - She was given 10 mg IV diltiazem and was then started on diltiazem infusion in ED  - Continue diltiazem infusion for now, continue Toprol and Eliquis    2. Acute on chronic diastolic CHF; acute hypoxic respiratory failure  - Found to be hypervolemic with hypoxia in setting of rapid HR and recently stopping Lasix at home  - Continue diuresis with 40 mg IV Lasix q12h, monitor wt and I/Os, continue supplemental O2 as needed    3. Hyponatremia  - Serum sodium 130 on admission in setting of hypervolemia  - Monitor closely while diuresing    4. CKD II  - SCr is 1.12 on admission; baseline appears to be ~0.9  - Follow closely while diuresing    DVT prophylaxis: Eliquis  Code Status: Full  Level of Care: Level of care: Telemetry Cardiac Family Communication: None present   Disposition Plan:  Patient is from: home  Anticipated d/c is to: TBD Anticipated d/c date is: 03/24/23  Patient currently: Pending improved/stable HR and respiratory status  Consults called: none  Admission status: Inpatient     Vianne Bulls, MD [facility]  03/20/2023, 9:03 PM

## 2023-03-20 NOTE — ED Notes (Signed)
[facility] at bedside 

## 2023-03-20 NOTE — ED Triage Notes (Signed)
Patient here POV from Home.  Endorses SOB that began recently a week or so ago. Recently Discharged from [facility] for New Onset Atrial Fib. Seen by Cardiologist yesterday and instructed to seek ED Evaluation if Symptoms worsened.   NAD Noted during Triage. A&Ox4. GCS 15. Ambulatory.

## 2023-03-20 NOTE — ED Provider Notes (Signed)
[facility] Provider Note   CSN: [id number] Arrival date & time: 03/20/23  1435     History  Chief Complaint  Patient presents with   Shortness of Breath    Sarah Lewis is a 87 y.o. female.   Shortness of Breath Patient presents with shortness of breath.  Recent admission to the hospital with new onset A-fib and CHF.  Also has chronic interstitial lung disease.  Was cardioverted on the 19th of last month.  However around 3 days ago developed a return of her atrial fibrillation.  Is on anticoagulation.  Was seen in cardiology clinic yesterday and reportedly had not been taking her Lasix due to rash.  Did have some mild increase swelling the legs.  Has been compliant with her Eliquis.    Past Medical History:  Diagnosis Date   Anemia yrs ago   Aortic insufficiency    Breast cancer of lower-outer quadrant of right female breast 10/10/2015   left breat also chemo done for first   Cough    from toporol   Heart murmur    Hypertension    Osteoporosis    Spinal headache yrs ago    Home Medications Prior to Admission medications   Medication Sig Start Date End Date Taking? Authorizing Provider  albuterol (PROVENTIL) (2.5 MG/3ML) 0.083% nebulizer solution Take 3 mLs (2.5 mg total) by nebulization every 6 (six) hours as needed for wheezing or shortness of breath. 09/19/20   Icard, Octavio Graves, DO  apixaban (ELIQUIS) 2.5 MG TABS tablet Take 1 tablet (2.5 mg total) by mouth 2 (two) times daily. 03/06/23   Mercy Riding, MD  Calcium Carb-Cholecalciferol (CALCIUM-VITAMIN D) 500-400 MG-UNIT TABS Take 1 tablet by mouth 2 (two) times daily.    [provider]  Cholecalciferol (VITAMIN D-3) 25 MCG (1000 UT) CAPS Take 1 capsule by mouth daily.    [provider]  denosumab (PROLIA) 60 MG/ML SOSY injection Inject 60 mg into the skin every 6 (six) months. 09/16/17   [provider]  ipratropium (ATROVENT) 0.02 % nebulizer  solution TAKE 2.5 MLS BY NEBULIZATION EVERY 6 (SIX) HOURS AS NEEDED FOR WHEEZING OR SHORTNESS OF BREATH. 11/29/22   Icard, Bradley L, DO  ipratropium (ATROVENT) 0.02 % nebulizer solution Take 0.25 mg by nebulization daily.    [provider]  metoprolol succinate (TOPROL-XL) 100 MG 24 hr tablet Take 100 mg by mouth daily. Take with or immediately following a meal.    [provider]  metoprolol tartrate (LOPRESSOR) 100 MG tablet Take 1 tablet (100 mg total) by mouth 2 (two) times daily. 03/19/23   Lenna Sciara, NP      Allergies    Amlodipine benzoate and Losartan potassium    Review of Systems   Review of Systems  Respiratory:  Positive for shortness of breath.     Physical Exam Updated Vital Signs BP 106/82   Pulse 97   Temp (!) 96.6 F (35.9 C) (Temporal)   Resp (!) 27   Ht 4\' 11"  (1.499 m)   Wt 43.5 kg   SpO2 (!) 87%   BMI 19.37 kg/m  Physical Exam Vitals and nursing note reviewed.  Cardiovascular:     Rate and Rhythm: Tachycardia present. Rhythm irregular.  Pulmonary:     Breath sounds: No wheezing or rales.  Chest:     Chest wall: No tenderness.  Musculoskeletal:     Cervical back: Neck supple.     Comments:  Mild edema bilateral ankles.  Skin:    General: Skin is warm.     Capillary Refill: Capillary refill takes less than 2 seconds.  Neurological:     Mental Status: She is alert and oriented to person, place, and time.     ED Results / Procedures / Treatments   Labs (all labs ordered are listed, but only abnormal results are displayed) Labs Reviewed  BASIC METABOLIC PANEL - Abnormal; Notable for the following components:      Result Value   Sodium 130 (*)    Potassium 5.3 (*)    Glucose, Bld 156 (*)    BUN 33 (*)    Creatinine, Ser 1.12 (*)    GFR, Estimated 48 (*)    All other components within normal limits  CBC - Abnormal; Notable for the following components:   Hemoglobin 15.7 (*)    HCT 46.2 (*)    All other components within  normal limits  BRAIN NATRIURETIC PEPTIDE - Abnormal; Notable for the following components:   B Natriuretic Peptide 1,086.9 (*)    All other components within normal limits    EKG EKG Interpretation  Date/Time:  Wednesday March 20 2023 14:44:23 EDT Ventricular Rate:  131 PR Interval:    QRS Duration: 90 QT Interval:  304 QTC Calculation: 448 R Axis:   -66 Text Interpretation: Atrial fibrillation with rapid ventricular response Left axis deviation Low voltage QRS Septal infarct (cited on or before 05-Mar-2023) Abnormal ECG When compared with ECG of 05-Mar-2023 12:33, Atrial fibrillation has replaced Sinus rhythm Vent. rate has increased BY  54 BPM Nonspecific T wave abnormality now evident in Anterior leads Confirmed by Davonna Belling [phone]) on 03/20/2023 3:29:51 PM  Radiology DG Chest 2 View  Result Date: 03/20/2023 CLINICAL DATA:  Shortness of breath for the past week. EXAM: CHEST - 2 VIEW COMPARISON:  CT chest and chest x-ray dated March 02, 2023. FINDINGS: Unchanged cardiomegaly and small right greater than left pleural effusions. Normal pulmonary vascularity. Similar bibasilar scarring. No focal consolidation or pneumothorax. No acute osseous abnormality. IMPRESSION: 1. Unchanged small right greater than left pleural effusions. Electronically Signed   By: Titus Dubin M.D.   On: 03/20/2023 15:22    Procedures Procedures    Medications Ordered in ED Medications  diltiazem (CARDIZEM) 1 mg/mL load via infusion 10 mg (10 mg Intravenous Bolus from Bag 03/20/23 1554)    And  diltiazem (CARDIZEM) 125 mg in dextrose 5% 125 mL (1 mg/mL) infusion (5 mg/hr Intravenous New Bag/Given 03/20/23 1554)  furosemide (LASIX) injection 40 mg (40 mg Intravenous Given 03/20/23 1653)    ED Course/ Medical Decision Making/ A&P                             Medical Decision Making Amount and/or Complexity of Data Reviewed Labs: ordered. Radiology: ordered.  Risk Prescription drug  management. Decision regarding hospitalization.   Patient presents with shortness of breath.  Has had a return of her A-fib over the last few days.  Had attempted outpatient management with adjustment of her medication, however feels more short of breath today.  A-fib RVR upon arrival.  Initially not hypoxic but later hypoxia developed required a small amount of oxygen.  Discussed with Dr. Ellyn Hack from cardiology.  Initial plan was hopefully for cardioversion and if she was feeling much improved potential discharge home.  However with the hypoxia I do not think she is a good candidate  for ED cardioversion.  Will admit to internal medicine.  Has been started Cardizem drip and now appears to be rate controlled.  Chadsvasc 5  Also has mild hyponatremia and slightly elevated potassium.  CRITICAL CARE Performed by: Davonna Belling Total critical care time: 30 minutes Critical care time was exclusive of separately billable procedures and treating other patients. Critical care was necessary to treat or prevent imminent or life-threatening deterioration. Critical care was time spent personally by me on the following activities: development of treatment plan with patient and/or surrogate as well as nursing, discussions with consultants, evaluation of patient's response to treatment, examination of patient, obtaining history from patient or surrogate, ordering and performing treatments and interventions, ordering and review of laboratory studies, ordering and review of radiographic studies, pulse oximetry and re-evaluation of patient's condition.   BNP is elevated.  Mild hypokalemia also should be treated by the Lasix given.  Discussed with Dr. Cathlean Sauer, who will admit.        Final Clinical Impression(s) / ED Diagnoses Final diagnoses:  Atrial fibrillation with rapid ventricular response  Hyponatremia  Hypoxia  Congestive heart failure, unspecified HF chronicity, unspecified heart failure type     Rx / DC Orders ED Discharge Orders     None         Davonna Belling, MD 03/20/23 1708

## 2023-03-20 NOTE — ED Notes (Signed)
Handoff report given to [facility] and to [employer] on 3E at [employer].

## 2023-03-20 NOTE — ED Notes (Signed)
Sarah Lewis will send transport for Bed Ready at [facility] Room#03.-ABB(NS)

## 2023-03-21 DIAGNOSIS — I5033 Acute on chronic diastolic (congestive) heart failure: Secondary | ICD-10-CM | POA: Diagnosis not present

## 2023-03-21 DIAGNOSIS — M069 Rheumatoid arthritis, unspecified: Secondary | ICD-10-CM | POA: Diagnosis not present

## 2023-03-21 DIAGNOSIS — I4819 Other persistent atrial fibrillation: Secondary | ICD-10-CM | POA: Diagnosis not present

## 2023-03-21 DIAGNOSIS — E871 Hypo-osmolality and hyponatremia: Secondary | ICD-10-CM | POA: Diagnosis not present

## 2023-03-21 DIAGNOSIS — I5030 Unspecified diastolic (congestive) heart failure: Secondary | ICD-10-CM | POA: Diagnosis not present

## 2023-03-21 DIAGNOSIS — I4891 Unspecified atrial fibrillation: Secondary | ICD-10-CM | POA: Diagnosis not present

## 2023-03-21 DIAGNOSIS — I480 Paroxysmal atrial fibrillation: Secondary | ICD-10-CM

## 2023-03-21 DIAGNOSIS — J449 Chronic obstructive pulmonary disease, unspecified: Secondary | ICD-10-CM

## 2023-03-21 LAB — CBC
HCT: 38.8 % (ref 36.0–46.0)
Hemoglobin: 13.4 g/dL (ref 12.0–15.0)
MCH: 31.2 pg (ref 26.0–34.0)
MCHC: 34.5 g/dL (ref 30.0–36.0)
MCV: 90.4 fL (ref 80.0–100.0)
Platelets: 281 10*3/uL (ref 150–400)
RBC: 4.29 MIL/uL (ref 3.87–5.11)
RDW: 13.4 % (ref 11.5–15.5)
WBC: 10.3 10*3/uL (ref 4.0–10.5)
nRBC: 0 % (ref 0.0–0.2)

## 2023-03-21 LAB — BASIC METABOLIC PANEL
Anion gap: 7 (ref 5–15)
BUN: 27 mg/dL — ABNORMAL HIGH (ref 8–23)
CO2: 27 mmol/L (ref 22–32)
Calcium: 8.4 mg/dL — ABNORMAL LOW (ref 8.9–10.3)
Chloride: 97 mmol/L — ABNORMAL LOW (ref 98–111)
Creatinine, Ser: 1.12 mg/dL — ABNORMAL HIGH (ref 0.44–1.00)
GFR, Estimated: 48 mL/min — ABNORMAL LOW (ref >60)
Glucose, Bld: 135 mg/dL — ABNORMAL HIGH (ref 70–99)
Potassium: 3.9 mmol/L (ref 3.5–5.1)
Sodium: 131 mmol/L — ABNORMAL LOW (ref 135–145)

## 2023-03-21 LAB — MAGNESIUM: Magnesium: 1.6 mg/dL — ABNORMAL LOW (ref 1.7–2.4)

## 2023-03-21 MED ORDER — POTASSIUM CHLORIDE CRYS ER 20 MEQ PO TBCR
20.0000 meq | EXTENDED_RELEASE_TABLET | Freq: Once | ORAL | Status: AC
  Administered 2023-03-21: 20 meq via ORAL
  Filled 2023-03-21: qty 1

## 2023-03-21 MED ORDER — EMPAGLIFLOZIN 10 MG PO TABS
10.0000 mg | ORAL_TABLET | Freq: Every day | ORAL | Status: DC
  Administered 2023-03-21 – 2023-03-25 (×5): 10 mg via ORAL
  Filled 2023-03-21 (×5): qty 1

## 2023-03-21 MED ORDER — FUROSEMIDE 10 MG/ML IJ SOLN
40.0000 mg | Freq: Every day | INTRAMUSCULAR | Status: DC
  Administered 2023-03-22: 40 mg via INTRAVENOUS
  Filled 2023-03-21: qty 4

## 2023-03-21 MED ORDER — MAGNESIUM SULFATE 4 GM/100ML IV SOLN
4.0000 g | Freq: Once | INTRAVENOUS | Status: AC
  Administered 2023-03-21: 4 g via INTRAVENOUS
  Filled 2023-03-21: qty 100

## 2023-03-21 NOTE — Assessment & Plan Note (Signed)
No clinical signs of exacerbation, continue oxymetry monitoring.  ?

## 2023-03-21 NOTE — Assessment & Plan Note (Addendum)
CKD stage 3a. Hyponatremia, hypomagnesemia and hyperkalemia  Renal function today with serum cr at 0,86, K is 4,0 and serum bicarbonate at 27, Na 130 and Mg 2.0   Plan to continue close follow up on renal function and electrolytes.  Keep K at 4 and Mg at 2,0

## 2023-03-21 NOTE — Plan of Care (Signed)

## 2023-03-21 NOTE — Progress Notes (Signed)
Rounding Note    Patient Name: Sarah Lewis Date of Encounter: 03/21/2023  [place] Cardiologist: Kirk Ruths, MD   Subjective   No CP; mild dyspnea  Inpatient Medications    Scheduled Meds:  apixaban  2.5 mg Oral BID   furosemide  40 mg Intravenous Q12H   metoprolol succinate  100 mg Oral Daily   sodium chloride flush  3 mL Intravenous Q12H   Continuous Infusions:  diltiazem (CARDIZEM) infusion 5 mg/hr (03/21/23 0349)   PRN Meds: acetaminophen **OR** acetaminophen, melatonin, ondansetron **OR** ondansetron (ZOFRAN) IV, polyethylene glycol   Vital Signs    Vitals:   03/20/23 2054 03/21/23 0033 03/21/23 0409 03/21/23 0725  BP: 117/77 109/73 106/61 105/74  Pulse: 83 68 81 92  Resp: 20 18 18 18   Temp: 97.8 F (36.6 C) 97.7 F (36.5 C) 97.7 F (36.5 C) (!) 97.5 F (36.4 C)  TempSrc: Oral Oral Oral Oral  SpO2: 95% 94% 92% 97%  Weight: 42.4 kg 42.4 kg    Height: 4\' 11"  (1.499 m)       Intake/Output Summary (Last 24 hours) at 03/21/2023 1027 Last data filed at 03/21/2023 0346 Gross per 24 hour  Intake 67.8 ml  Output --  Net 67.8 ml      03/21/2023   12:33 AM 03/20/2023    8:54 PM 03/20/2023    2:47 PM  Last 3 Weights  Weight (lbs) 93 lb 8 oz 93 lb 8 oz 95 lb 14.4 oz  Weight (kg) 42.411 kg 42.411 kg 43.5 kg      Telemetry    Atrial fibrillation rate controlled - Personally Reviewed   Physical Exam   GEN: No acute distress.  Frail Neck: No JVD Cardiac: irregular Respiratory: Diminished BS RLL GI: Soft, nontender, non-distended  MS: No edema Neuro:  Nonfocal  Psych: Normal affect   Labs    High Sensitivity Troponin:   Recent Labs  Lab 03/02/23 1957  TROPONINIHS 8     Chemistry Recent Labs  Lab 03/20/23 1453 03/21/23 0035  NA 130* 131*  K 5.3* 3.9  CL 98 97*  CO2 22 27  GLUCOSE 156* 135*  BUN 33* 27*  CREATININE 1.12* 1.12*  CALCIUM 9.7 8.4*  MG  --  1.6*  GFRNONAA 48* 48*  ANIONGAP 10 7     Hematology Recent  Labs  Lab 03/20/23 1453 03/21/23 0035  WBC 10.0 10.3  RBC 5.08 4.29  HGB 15.7* 13.4  HCT 46.2* 38.8  MCV 90.9 90.4  MCH 30.9 31.2  MCHC 34.0 34.5  RDW 13.5 13.4  PLT 394 281    BNP Recent Labs  Lab 03/20/23 1453  BNP 1,086.9*     Radiology    DG Chest 2 View  Result Date: 03/20/2023 CLINICAL DATA:  Shortness of breath for the past week. EXAM: CHEST - 2 VIEW COMPARISON:  CT chest and chest x-ray dated March 02, 2023. FINDINGS: Unchanged cardiomegaly and small right greater than left pleural effusions. Normal pulmonary vascularity. Similar bibasilar scarring. No focal consolidation or pneumothorax. No acute osseous abnormality. IMPRESSION: 1. Unchanged small right greater than left pleural effusions. Electronically Signed   By: Titus Dubin M.D.   On: 03/20/2023 15:22      Patient Profile     87 y.o. female with past medical history of paroxysmal atrial fibrillation, chronic diastolic congestive heart failure, history of breast cancer status postmastectomy admitted with recurrent atrial fibrillation.  Recently discharged following admission for atrial fibrillation which  was treated with TEE cardioversion.  However when she returned for follow-up atrial fibrillation had recurred.  Echocardiogram March 2024 showed normal LV function, severe biatrial enlargement, mild mitral regurgitation, moderate tricuspid regurgitation, moderate aortic insufficiency.  Transesophageal echocardiogram March 2024 showed normal LV function, severe left atrial enlargement, severe right atrial enlargement, mild mitral regurgitation, moderate tricuspid regurgitation and mild aortic insufficiency.  Assessment & Plan    1 paroxysmal atrial fibrillation-patient had recent TEE guided cardioversion with significant improvement in her dyspnea/palpitations.  However atrial fibrillation has recurred and is again associated with worsening CHF.  She will need an antiarrhythmic to help maintain sinus rhythm.  She is  not a good candidate for amiodarone given underlying bronchiectasis.  I will ask EP to review for potential Tikosyn.  Continue apixaban (she has missed no doses).  If she does not convert with Tikosyn will plan cardioversion after initiation.  Note her LV function is normal.  CHA2DS2-VASc is 4.  Continue Cardizem and metoprolol for rate control.  2 acute on chronic diastolic congestive heart failure-she has noted to have pleural effusions.  Will decrease Lasix to 40 mg daily.  Follow renal function closely.  For questions or updates, please contact [place] Please consult [email or web address] for contact info under        Signed, Kirk Ruths, MD  03/21/2023, 10:27 AM

## 2023-03-21 NOTE — Progress Notes (Addendum)
Mobility Specialist Progress Note:   03/21/23 0900  Orthostatic Sitting  BP- Sitting 106/57 (MAP 72)  Pulse- Sitting 85  Orthostatic Standing at 0 minutes  BP- Standing at 0 minutes (!) 84/62 (MAP 71)  Pulse- Standing at 0 minutes 86  Mobility  Activity Stood at bedside  Level of Assistance Standby assist, set-up cues, supervision of patient - no hands on  Assistive Device None  Activity Response Tolerated fair  Mobility Referral Yes  $Mobility charge 1 Mobility   Pt agreeable to mobility session. C/o dizziness throughout. Pt with positive orthostatic Bps today. Further mobility deferred. Pt left sitting in chair with all needs met. BP 119/78 at EOS.  Nelta Numbers Mobility Specialist Please contact via SecureChat or  Rehab office at [phone]

## 2023-03-21 NOTE — Assessment & Plan Note (Signed)
No active flare. PT and Ot.

## 2023-03-21 NOTE — TOC Progression Note (Signed)
Transition of Care [facility]) - Progression Note    Patient Details  Name: Sarah Lewis MRN: [id number] Date of Birth: [birth year]  [facility] CM/SW Contact  Zenon Mayo, RN Phone Number: 03/21/2023, 2:49 PM  Clinical Narrative:    From home with spouse,  indep, fibr rvr, sob, she states spouse will transport her home at dc.         Expected Discharge Plan and Services                                               Social Determinants of Health (SDOH) Interventions SDOH Screenings   Tobacco Use: Low Risk  (03/20/2023)    Readmission Risk Interventions     No data to display

## 2023-03-21 NOTE — Hospital Course (Addendum)
Sarah Lewis was admitted to the hospital with the working diagnosis of atrial fibrillation with RVR.   87 yo female with the past medical history of atrial fibrillation, heart failure, RA, ILD and breast cancer who presented with dyspnea, fatigue and palpitations. Recent hospitalization for heart failure and atrial fibrillation 03/16 to 03/06/23, requiring direct current cardioversion. Apparently patient doing well at home until 2 to 3 days ago when she developed palpitations and dyspnea. She was evaluated be cardiology one day prior to this admission, recommended transfer to the hospital but she declined hospitalization, then her metoprolol dose was increased. Because persistent symptoms she came to the ED. On her initial physical examination her blood pressure was 140/96, HR 130, RR 27 and 02 saturation 87%, patient was placed on supplemental 02, diltiazem infusion and received 40 mg IV furosemide, prior to transport to [facility] from [facility].  At the time of her transfer her HR 80, blood pressure 125/88, and 02 saturation 98% with RR 22. Lungs with no wheezing or rales, heart with S1 and S2 present irregularly irregular with no gallops, abdomen with no distention and trace lower extremity edema.   Na 130, K 53, CL 98 bicarbonate 22, glucose 156, bun 33 cr 1,12 BNP 1,086  Wbc 10,0 hgb 15,7 plt 394  Sars covid 19 negative   Chest radiograph with hyperinflation, cephalization of the vasculature and small bilateral pleural effusions, more right than left.   EKG 131 bpm, left axis deviation, normal qtc, atrial fibrillation rhythm, with no significant ST segment or T wave changes.   Patient was placed on diltiazem infusion for rate control.  Diuresis with furosemide.  Patient was placed on Dofetilide for rhythm control.   04/05 plan to continue telemetry monitoring, if persistent a fib plan for direct current cardioversion.  04/06 patient converted to sinus rhythm, diltiazem was discontinued.  04/07  continue sinus rhythm.  04/08 patient will have Loop/ Alleviate HF today and then discharge home with close follow up as outpatient.

## 2023-03-21 NOTE — Assessment & Plan Note (Addendum)
03/19 direct current cardioversion.  Patient initially placed on diltiazem infusion, and continued on oral metoprolol.  Considering history of ILD, patient not candidate for amiodarone, and decision was made to start patient on Dofetilide.  Patient converted to sinus rhythm, and remained on telemetry monitoring for close observation.   Patient tolerated antiarrhythmic therapy well, plan to continue dofetilide and have close follow up as outpatient.  Loop recorder implanted prior to her discharge.   Continue anticoagulation with apixaban.   Discharge EKG with 68 bpm, left axis deviation, qtc 444, sinus rhythm with poor R R wave progression with no significant ST segment or T wave changes.

## 2023-03-21 NOTE — Assessment & Plan Note (Addendum)
Echocardiogram with preserved LV systolic function EF 55 to 60%, no LVH, RV with normal systolic function, RVSP 41.2, LA and RA with severe dilatation, no pericardial effusion, moderate TR, moderate aortic valve regurgitation.  Patient was placed on furosemide with improvement on her volume status.  Continue empagliflozin and metoprolol.  Limited pharmacologic options due to risk of hypotension,  Acute hypoxemic respiratory failure due to cardiogenic pulmonary edema with bilateral pleural effusions.  Respiratory failure has resolved.

## 2023-03-21 NOTE — Care Management CC44 (Signed)
Condition Code 44 Documentation Completed  Patient Details  Name: Sarah Lewis MRN: [id number] Date of Birth: 1936/09/01   Condition Code 44 given:  Yes Patient signature on Condition Code 44 notice:  Yes Documentation of 2 MD's agreement:  Yes Code 44 added to claim:  Yes    Zenon Mayo, RN 03/21/2023, 2:42 PM

## 2023-03-21 NOTE — Consult Note (Signed)
Cardiology Consultation   Patient ID: Sarah LIBERTINI MRN: [id number]; DOB: 27-Jun-1936  Admit date: 03/20/2023 Date of Consult: 03/21/2023  PCP:  Carol Ada, [place] Prov[facility] Cardiologist:  Kirk Ruths, MD        Patient Profile:   Sarah Lewis is a 86 y.o. female with a hx of recently diagnosed PAF (on Eliquis), HFpEF (EF 55-60%), RA, ILD, breast cancer s/p mastectomy who is being seen 03/21/2023 for the evaluation of atrial fibrillation at the request of Dr. Myna Hidalgo.  History of Present Illness:   Ms. Sarah Lewis was recently admitted to [facility] 3/16 - 3/20 for new onset atrial fibrillation with RVR.  During that visit the patient underwent a TEE cardioversion which was successful.  She was started on Eliquis for stroke prevention, metoprolol and Lasix.  Since discharge the patient has been doing well until on Easter Sunday she developed repeat symptoms of SOB/DOE.  She was hopeful that her symptoms will subside; however, they persisted prompting her to come into the ED yesterday.  Of note the patient was seen by cardiology on 03/19/2023 it was noted to be back in atrial fibrillation at that time, but the patient refused ED evaluation.  She denies having any chest pain, presyncope, syncope, weakness, fevers, chills, GI symptoms, PND, orthopnea or palpitations.  She endorses some insomnia and lower extremity edema.  She has not missed any doses of her Eliquis since starting.  In the ED her VS were afebrile, HR 130s, BP 140/96, RR 20 and satting 94% on RA.  Labs were notable for sodium 130, creatinine 1.1, potassium 5.3, BNP 1086.  EKG showed atrial fibrillation with RVR without ischemic changes.  CXR showed small bilateral pleural effusions which are unchanged from prior.  She was started on a diltiazem drip with improved rate control.  She is admitted to hospital medicine and cardiology is consulted for evaluation.  She currently denies having any symptoms.    Past  Medical History:  Diagnosis Date   Anemia yrs ago   Aortic insufficiency    Breast cancer of lower-outer quadrant of right female breast 10/10/2015   left breat also chemo done for first   Cough    from toporol   Heart murmur    Hypertension    Osteoporosis    Spinal headache yrs ago    Past Surgical History:  Procedure Laterality Date   ABDOMINAL HYSTERECTOMY     total   APPENDECTOMY     BREAST RECONSTRUCTION WITH PLACEMENT OF TISSUE EXPANDER AND FLEX HD (ACELLULAR HYDRATED DERMIS) Right 12/13/2015   Procedure: RIGHT BREAST RECONSTRUCTION WITH PLACEMENT OF silicone gel implant and allograft tissue;  Surgeon: Crissie Reese, MD;  Location: [place];  Service: Plastics;  Laterality: Right;   BREAST SURGERY     CARDIOVERSION N/A 03/05/2023   Procedure: CARDIOVERSION;  Surgeon: Pixie Casino, MD;  Location: [place];  Service: Cardiovascular;  Laterality: N/A;   CESAREAN SECTION     x 2   COLONOSCOPY WITH PROPOFOL N/A 04/08/2017   Procedure: COLONOSCOPY WITH PROPOFOL;  Surgeon: Garlan Fair, MD;  Location: [facility];  Service: Endoscopy;  Laterality: N/A;   HERNIA REPAIR     MASTECTOMY Left 1979   TEE WITHOUT CARDIOVERSION N/A 03/05/2023   Procedure: TRANSESOPHAGEAL ECHOCARDIOGRAM (TEE);  Surgeon: Pixie Casino, MD;  Location: [place];  Service: Cardiovascular;  Laterality: N/A;   TONSILLECTOMY     TOTAL MASTECTOMY Right 12/13/2015   Procedure: RIGHT MASTECTOMY ;  Surgeon: Stark Klein, MD;  Location: [place];  Service: General;  Laterality: Right;     Home Medications:  Prior to Admission medications   Medication Sig Start Date End Date Taking? Authorizing Provider  albuterol (PROVENTIL) (2.5 MG/3ML) 0.083% nebulizer solution Take 3 mLs (2.5 mg total) by nebulization every 6 (six) hours as needed for wheezing or shortness of breath. 09/19/20   Icard, Octavio Graves, DO  apixaban (ELIQUIS) 2.5 MG TABS tablet Take 1 tablet (2.5 mg total) by mouth 2 (two) times daily.  03/06/23   Mercy Riding, MD  Calcium Carb-Cholecalciferol (CALCIUM-VITAMIN D) 500-400 MG-UNIT TABS Take 1 tablet by mouth 2 (two) times daily.    [provider]  Cholecalciferol (VITAMIN D-3) 25 MCG (1000 UT) CAPS Take 1 capsule by mouth daily.    [provider]  denosumab (PROLIA) 60 MG/ML SOSY injection Inject 60 mg into the skin every 6 (six) months. 09/16/17   [provider]  ipratropium (ATROVENT) 0.02 % nebulizer solution TAKE 2.5 MLS BY NEBULIZATION EVERY 6 (SIX) HOURS AS NEEDED FOR WHEEZING OR SHORTNESS OF BREATH. 11/29/22   Icard, Bradley L, DO  ipratropium (ATROVENT) 0.02 % nebulizer solution Take 0.25 mg by nebulization daily.    [provider]  metoprolol succinate (TOPROL-XL) 100 MG 24 hr tablet Take 100 mg by mouth daily. Take with or immediately following a meal.    [provider]  metoprolol tartrate (LOPRESSOR) 100 MG tablet Take 1 tablet (100 mg total) by mouth 2 (two) times daily. 03/19/23   Lenna Sciara, NP    Inpatient Medications: Scheduled Meds:  apixaban  2.5 mg Oral BID   furosemide  40 mg Intravenous Q12H   metoprolol succinate  100 mg Oral Daily   sodium chloride flush  3 mL Intravenous Q12H   Continuous Infusions:  diltiazem (CARDIZEM) infusion 5 mg/hr (03/20/23 1554)   PRN Meds: acetaminophen **OR** acetaminophen, melatonin, ondansetron **OR** ondansetron (ZOFRAN) IV, polyethylene glycol  Allergies:    Allergies  Allergen Reactions   Amlodipine Benzoate Shortness Of Breath   Losartan Potassium Shortness Of Breath    Social History:   Social History   Socioeconomic History   Marital status: Married    Spouse name: Not on file   Number of children: 2   Years of education: Not on file   Highest education level: Not on file  Occupational History   Not on file  Tobacco Use   Smoking status: Never   Smokeless tobacco: Never  Vaping Use   Vaping Use: Never used  Substance and Sexual Activity    Alcohol use: No   Drug use: No   Sexual activity: Not on file  Other Topics Concern   Not on file  Social History Narrative   Not on file   Social Determinants of Health   Financial Resource Strain: Not on file  Food Insecurity: Not on file  Transportation Needs: Not on file  Physical Activity: Not on file  Stress: Not on file  Social Connections: Not on file  Intimate Partner Violence: Not on file    Family History:    Family History  Problem Relation Age of Onset   Other Mother        malignant neoplasm   Bone cancer Father        malignant neoplasm   Other Sister        malignant neoplasm     ROS:  Please see the history of present illness.  All  other ROS reviewed and negative.     Physical Exam/Data:   Vitals:   03/20/23 1900 03/20/23 1949 03/20/23 2054 03/21/23 0033  BP: 129/84  117/77 109/73  Pulse: 69  83 68  Resp: (!) 32  20 18  Temp:  (!) 97 F (36.1 C) 97.8 F (36.6 C) 97.7 F (36.5 C)  TempSrc:  Temporal Oral Oral  SpO2: 97%  95% 94%  Weight:   42.4 kg 42.4 kg  Height:   4\' 11"  (1.499 m)    No intake or output data in the 24 hours ending 03/21/23 0117    03/21/2023   12:33 AM 03/20/2023    8:54 PM 03/20/2023    2:47 PM  Last 3 Weights  Weight (lbs) 93 lb 8 oz 93 lb 8 oz 95 lb 14.4 oz  Weight (kg) 42.411 kg 42.411 kg 43.5 kg     Body mass index is 18.88 kg/m.  General:  Well nourished, well developed, in no acute distress, very pleasant HEENT: normal Neck: no JVD Vascular: No carotid bruits; Distal pulses 2+ bilaterally Cardiac: Irregularly irregular rhythm with regular rate, normal [id number], II/VI systolic murmur at the LLSB, no rubs or gallops Lungs: Coarse crackles heard throughout lung fields bilaterally, no wheezes or rhonchi Abd: soft, nontender, no hepatomegaly  Ext: Trace 1+ edema bilaterally, WBC Musculoskeletal:  No deformities, BUE and BLE strength normal and equal Skin: warm and dry  Neuro:  CNs 2-12 intact, no focal abnormalities  noted Psych:  Normal affect   EKG:  The EKG was personally reviewed and demonstrates: Atrial fibrillation with RVR    Telemetry:  Telemetry was personally reviewed and demonstrates:  Afib rate controlled  Relevant CV Studies:  TEE 03/05/23:  IMPRESSIONS     1. Left ventricular ejection fraction, by estimation, is 60 to 65%. The  left ventricle has normal function.   2. Right ventricular systolic function is normal. The right ventricular  size is normal.   3. Left atrial size was severely dilated. No left atrial/left atrial  appendage thrombus was detected.   4. Right atrial size was severely dilated.   5. The mitral valve is abnormal. Mild mitral valve regurgitation.   6. The tricuspid valve is abnormal. Tricuspid valve regurgitation is  moderate.   7. The aortic valve is tricuspid. Aortic valve regurgitation is mild.  Aortic valve sclerosis is present, with no evidence of aortic valve  stenosis.   TTE 03/03/23:  IMPRESSIONS     1. Left ventricular ejection fraction, by estimation, is 55 to 60%. The  left ventricle has normal function. The left ventricle has no regional  wall motion abnormalities. Left ventricular diastolic parameters are  indeterminate.   2. Right ventricular systolic function is normal. The right ventricular  size is normal. There is mildly elevated pulmonary artery systolic  pressure. The estimated right ventricular systolic pressure is [id number] mmHg.   3. Left atrial size was severely dilated.   4. Right atrial size was severely dilated.   5. Mild mitral valve regurgitation.   6. Tricuspid valve regurgitation is moderate.   7. The aortic valve was not well visualized. There is moderate  calcification of the aortic valve. Aortic valve regurgitation is moderate.   8. The inferior vena cava is normal in size with <50% respiratory  variability, suggesting right atrial pressure of 8 mmHg.   Laboratory Data:  High Sensitivity Troponin:   Recent Labs   Lab 03/02/23 1957  TROPONINIHS 8     Chemistry  Recent Labs  Lab 03/20/23 1453  NA 130*  K 5.3*  CL 98  CO2 22  GLUCOSE 156*  BUN 33*  CREATININE 1.12*  CALCIUM 9.7  GFRNONAA 48*  ANIONGAP 10    No results for input(s): "PROT", "ALBUMIN", "AST", "ALT", "ALKPHOS", "BILITOT" in the last 168 hours. Lipids No results for input(s): "CHOL", "TRIG", "HDL", "LABVLDL", "LDLCALC", "CHOLHDL" in the last 168 hours.  Hematology Recent Labs  Lab 03/20/23 1453 03/21/23 0035  WBC 10.0 10.3  RBC 5.08 4.29  HGB 15.7* 13.4  HCT 46.2* 38.8  MCV 90.9 90.4  MCH 30.9 31.2  MCHC 34.0 34.5  RDW 13.5 13.4  PLT 394 281   Thyroid No results for input(s): "TSH", "FREET4" in the last 168 hours.  BNP Recent Labs  Lab 03/20/23 1453  BNP 1,086.9*    DDimer No results for input(s): "DDIMER" in the last 168 hours.   Radiology/Studies:  DG Chest 2 View  Result Date: 03/20/2023 CLINICAL DATA:  Shortness of breath for the past week. EXAM: CHEST - 2 VIEW COMPARISON:  CT chest and chest x-ray dated March 02, 2023. FINDINGS: Unchanged cardiomegaly and small right greater than left pleural effusions. Normal pulmonary vascularity. Similar bibasilar scarring. No focal consolidation or pneumothorax. No acute osseous abnormality. IMPRESSION: 1. Unchanged small right greater than left pleural effusions. Electronically Signed   By: Titus Dubin M.D.   On: 03/20/2023 15:22     Assessment and Plan:   Sarah Lewis is a 87 y.o. female with a hx of recently diagnosed PAF (on Eliquis), HFpEF (EF 55-60%), RA, ILD, breast cancer s/p mastectomy who is being seen 03/21/2023 for the evaluation of atrial fibrillation at the request of Dr. Myna Hidalgo.  #PAF with RVR :: Patient presenting with recurrent atrial fibrillation with RVR despite a recent successful DCCV.  The patient does not tolerate being in atrial fibrillation even when rate controlled necessitating rhythm control strategy.  Unfortunately, many  antiarrhythmic drug options she is not a candidate for due to her underlying ILD and structural heart disease.  At this juncture, I think that repeat cardioversion to attempt to restore NSR is reasonable with consideration for catheter ablation if unsuccessful.  The patient recently had a cleared LAA by TEE and has been adherent to her apixaban so DCCV alone should suffice. -Maintain n.p.o. -Plan for DCCV -Continue Eliquis 2.5 mg twice daily -Continue diltiazem gtt -Consider EP involvement if cardioversion is unsuccessful -Avoid amiodarone given underlying ILD  #HFpEF :: Slightly volume up on exam today.  Agree with ongoing IV diuresis. -Continue IV Lasix for goal net -1L -Keep potassium >4, Mg >2 -Strict ins and outs -Daily weights    Risk Assessment/Risk Scores:        New York Heart Association (NYHA) Functional Class NYHA Class II  CHA2DS2-VASc Score = 5   This indicates a 7.2% annual risk of stroke. The patient's score is based upon: CHF History: 1 HTN History: 1 Diabetes History: 0 Stroke History: 0 Vascular Disease History: 0 Age Score: 2 Gender Score: 1         For questions or updates, please contact [place] Please consult [email or web address] for contact info under    Signed, Hershal Coria, MD  03/21/2023 1:17 AM

## 2023-03-21 NOTE — Progress Notes (Signed)
Progress Note   Patient: Sarah Lewis [id number] DOB: 05-Feb-1936 DOA: 03/20/2023     1 DOS: the patient was seen and examined on 03/21/2023   Brief hospital course: [name] was admitted to the hospital with the working diagnosis of atrial fibrillation with RVR.   87 yo female with the past medical history of atrial fibrillation, heart failure, RA, ILD and breast cancer who presented with dyspnea, fatigue and palpitations. Recent hospitalization for heart failure and atrial fibrillation 03/16 to 03/06/23, requiring direct current cardioversion. Apparently patient doing well at home until 2 to 3 days ago when she developed palpitations and dyspnea. She was evaluated be cardiology one day prior to this admission and she declined hospitalization, then her metoprolol dose was increased. Because persistent symptoms she came to the ED. On her initial physical examination her blood pressure was 140/96, HR 130, RR 27 and 02 saturation 87%, patient was placed on supplemental 02, diltiazem infusion and received 40 mg IV furosemide, prior to transport to [facility] from [facility].  At the time of her transfer her HR 80, blood pressure 125/88, and 02 saturation 98% with RR 22. Lungs with no wheezing or rales, heart with S1 and S2 present irregularly irregular with no gallops, abdomen with no distention and trace lower extremity edema.   Na 130, K 53, CL 98 bicarbonate 22, glucose 156, bun 33 cr 1,12 BNP 1,086  Wbc 10,0 hgb 15,7 plt 394  Sars covid 19 negative   Chest radiograph with hyperinflation, cephalization of the vasculature and small bilateral pleural effusions, more right than left.   EKG 131 bpm, left axis deviation, normal qtc, atrial fibrillation rhythm, with no significant ST segment or T wave changes.   Patient was placed on diltiazem infusion for rate control.  Diuresis with furosemide.  Patient was placed on Dofetilide for rhythm control.     Assessment and Plan: * Atrial fibrillation  with RVR No further palpitations but dizziness. 03/19 direct current cardioversion.  Heart rate is 80 to 90, persistent atrial fibrillation.   Plan to continue diltiazem IV for rate control. Metoprolol succinate 100 mg daily.  Anticoagulation with apixaban Plan to start patient on dofetilide in am per EP recommendations.  Keep K at 4 and Mg at 2.  Continue telemetry monitoring.   Acute on chronic diastolic CHF (congestive heart failure) Echocardiogram with preserved LV systolic function EF 55 to 60%, no LVH, RV with normal systolic function, RVSP [id number], LA and RA with severe dilatation, no pericardial effusion, moderate TR, moderate aortic valve regurgitation.  Documented urine output is [id number] cc Systolic blood pressure 123456 to 106 mmHg.   Plan to continue rate control atrial fibrillation. Metoprolol succinate 100 mg   On furosemide 40 mg IV daily.  Add SGLT 2 inh.  Limited further goal directed therapy due to risk of hypotension.  Positive orthostatics with standing blood pressure 84/62 mmHg.   Acute hypoxemic respiratory failure due to cardiogenic pulmonary edema with bilateral pleural effusions.  Continue diuresis with furosemide 02 saturation today is 97% on 2 l/min per [place]   Hyponatremia CKD stage 3a. Hyponatremia, hypomagnesemia and hyperkalemia  Renal function with serum cr at 1,12 with K at 3,9 and serum bicarbonate at 27, Na 131 and Mg 1,6   Plan to continue furosemide for diuresis.  Add 4 g mag sulfate IV Follow up renal function in am.   COPD (chronic obstructive pulmonary disease) No clinical signs of exacerbation, continue oxymetry monitoring.   Rheumatoid arthritis No active  flare. PT and Ot.         Subjective: Patient with dizziness and light headedness. Positive orthostatic hypotension.   Physical Exam: Vitals:   03/21/23 0033 03/21/23 0409 03/21/23 0725 03/21/23 1138  BP: 109/73 106/61 105/74 (!) 106/91  Pulse: 68 81 92 92  Resp: 18 18 18 18    Temp: 97.7 F (36.5 C) 97.7 F (36.5 C) (!) 97.5 F (36.4 C) (!) 97.5 F (36.4 C)  TempSrc: Oral Oral Oral Oral  SpO2: 94% 92% 97% 94%  Weight: 42.4 kg     Height:       Neurology awake and alert ENT with mild pallor Cardiovascular with S1 and S2 present, irregularly irregular with systolic murmur at the right lower sternal border, with no gallops or rubs No JVD No lower extremity edema  Respiratory with rales bilaterally with no wheezing or rhonchi Abdomen with no distention  Data Reviewed:    Family Communication: no family at the bedside   Disposition: Status is: Observation The patient remains OBS appropriate and will d/c before 2 midnights.  Planned Discharge Destination: Home     Author: Tawni Millers, MD 03/21/2023 2:54 PM  For on call review [email or web address].

## 2023-03-21 NOTE — Consult Note (Addendum)
Cardiology Consultation   Patient ID: Sarah Lewis MRN: [id number]; DOB: 1936/01/09  Admit date: 03/20/2023 Date of Consult: 03/21/2023  PCP:  Carol Ada, [place] Providers Cardiologist:  Kirk Ruths, MD   {   Patient Profile:   Sarah Lewis is a 87 y.o. female with a hx of breast cancer (b/l mastectomy/chemo), coronary Ca++ by CT, HTN, p.HTN, bronchiectasis, AFib who is being seen 03/21/2023 for the evaluation of AFib rhythm management options at the request of Dr. Stanford Breed.  History of Present Illness:   Ms. Sinagra saw Dr. Stanford Breed last 04/13/22, doing well, planned an echo to f/u on her mild to moderate aortic insufficiency and moderate to severe tricuspid regurgitation, and update her labs. LVEF 55-60% RVEF preserved, though moderately enlarged RVSP 41.4 Mod-severe TR  Looks like she last saw pulmonary, Dr. Valeta Harms Nov 2022, noted  01/23/2019 high-resolution CT chest: No fibrotic disease.  Small areas of clustered nodules within the right middle lobe right upper lobe.  Small areas of air trapping.  Assessment: Bronchiectasis without complication   She was doing well, no symptoms Maintained on albuterol and ipratropium    Admitted 03/03/23 c/o SOB, progressively worse she was in new onset rapid Afib, hypoxic, volume OL. Initially treated with dilt gtt, iv diuretics and OAC Her home Toprol 100mg  continued TEE: LVEF 60-65% RN normal LA severely enlarged, no thrombus, LAA no thrombus Mod TR DCCV to SR Discharged 03/06/23 On Eliquis, lasix and home toprol  Saw cards app 03/19/23, unfortunately back in AF 130's, toprol 100mg  daily stopped > lopressor 100mg  BID OFF lasix w/rash  Re-admitted yesterday with RVR again managed with diltiazem gtt with improved rate, unchanged/stable CXR, again felt to have volume OL IV lasix started EP is asked on board for rhythm management options, perhaps tikosyn.  LABS K+ 5.3 > 3.9 BUN/Creat 33/1.12 >  27/1.22 (baseline about 0.90) Mag 1.6 BNP 1086 WBC 10.3 H/H 13/38 Plts 281  3/17: TSH 2.094  She is feeling much better, but hungry She feels fairly terrible at least when she is going fast, weak, no energy.  In rate controlled Afib currently says " I feel good" No CP She has not had syncope  Past Medical History:  Diagnosis Date   Anemia yrs ago   Aortic insufficiency    Breast cancer of lower-outer quadrant of right female breast 10/10/2015   left breat also chemo done for first   Cough    from toporol   Heart murmur    Hypertension    Osteoporosis    Spinal headache yrs ago    Past Surgical History:  Procedure Laterality Date   ABDOMINAL HYSTERECTOMY     total   APPENDECTOMY     BREAST RECONSTRUCTION WITH PLACEMENT OF TISSUE EXPANDER AND FLEX HD (ACELLULAR HYDRATED DERMIS) Right 12/13/2015   Procedure: RIGHT BREAST RECONSTRUCTION WITH PLACEMENT OF silicone gel implant and allograft tissue;  Surgeon: Crissie Reese, MD;  Location: [place];  Service: Plastics;  Laterality: Right;   BREAST SURGERY     CARDIOVERSION N/A 03/05/2023   Procedure: CARDIOVERSION;  Surgeon: Pixie Casino, MD;  Location: [place];  Service: Cardiovascular;  Laterality: N/A;   CESAREAN SECTION     x 2   COLONOSCOPY WITH PROPOFOL N/A 04/08/2017   Procedure: COLONOSCOPY WITH PROPOFOL;  Surgeon: Garlan Fair, MD;  Location: [facility];  Service: Endoscopy;  Laterality: N/A;   HERNIA REPAIR     MASTECTOMY Left 1979  TEE WITHOUT CARDIOVERSION N/A 03/05/2023   Procedure: TRANSESOPHAGEAL ECHOCARDIOGRAM (TEE);  Surgeon: Pixie Casino, MD;  Location: [place];  Service: Cardiovascular;  Laterality: N/A;   TONSILLECTOMY     TOTAL MASTECTOMY Right 12/13/2015   Procedure: RIGHT MASTECTOMY ;  Surgeon: Stark Klein, MD;  Location: [place];  Service: General;  Laterality: Right;     Home Medications:  Prior to Admission medications   Medication Sig Start Date End Date Taking? Authorizing  Provider  albuterol (PROVENTIL) (2.5 MG/3ML) 0.083% nebulizer solution Take 3 mLs (2.5 mg total) by nebulization every 6 (six) hours as needed for wheezing or shortness of breath. 09/19/20   Icard, Octavio Graves, DO  apixaban (ELIQUIS) 2.5 MG TABS tablet Take 1 tablet (2.5 mg total) by mouth 2 (two) times daily. 03/06/23   Mercy Riding, MD  Calcium Carb-Cholecalciferol (CALCIUM-VITAMIN D) 500-400 MG-UNIT TABS Take 1 tablet by mouth 2 (two) times daily.    [provider]  Cholecalciferol (VITAMIN D-3) 25 MCG (1000 UT) CAPS Take 1 capsule by mouth daily.    [provider]  denosumab (PROLIA) 60 MG/ML SOSY injection Inject 60 mg into the skin every 6 (six) months. 09/16/17   [provider]  ipratropium (ATROVENT) 0.02 % nebulizer solution TAKE 2.5 MLS BY NEBULIZATION EVERY 6 (SIX) HOURS AS NEEDED FOR WHEEZING OR SHORTNESS OF BREATH. 11/29/22   Icard, Bradley L, DO  ipratropium (ATROVENT) 0.02 % nebulizer solution Take 0.25 mg by nebulization daily.    [provider]  metoprolol succinate (TOPROL-XL) 100 MG 24 hr tablet Take 100 mg by mouth daily. Take with or immediately following a meal.    [provider]  metoprolol tartrate (LOPRESSOR) 100 MG tablet Take 1 tablet (100 mg total) by mouth 2 (two) times daily. 03/19/23   Lenna Sciara, NP    Inpatient Medications: Scheduled Meds:  apixaban  2.5 mg Oral BID   [START ON 03/22/2023] furosemide  40 mg Intravenous Daily   metoprolol succinate  100 mg Oral Daily   sodium chloride flush  3 mL Intravenous Q12H   Continuous Infusions:  diltiazem (CARDIZEM) infusion 5 mg/hr (03/21/23 0349)   PRN Meds: acetaminophen **OR** acetaminophen, melatonin, ondansetron **OR** ondansetron (ZOFRAN) IV, polyethylene glycol  Allergies:    Allergies  Allergen Reactions   Amlodipine Benzoate Shortness Of Breath   Losartan Potassium Shortness Of Breath    Social History:   Social History   Socioeconomic History    Marital status: Married    Spouse name: Not on file   Number of children: 2   Years of education: Not on file   Highest education level: Not on file  Occupational History   Not on file  Tobacco Use   Smoking status: Never   Smokeless tobacco: Never  Vaping Use   Vaping Use: Never used  Substance and Sexual Activity   Alcohol use: No   Drug use: No   Sexual activity: Not on file  Other Topics Concern   Not on file  Social History Narrative   Not on file   Social Determinants of Health   Financial Resource Strain: Not on file  Food Insecurity: Not on file  Transportation Needs: Not on file  Physical Activity: Not on file  Stress: Not on file  Social Connections: Not on file  Intimate Partner Violence: Not on file    Family History:   Family History  Problem Relation Age of Onset   Other Mother  malignant neoplasm   Bone cancer Father        malignant neoplasm   Other Sister        malignant neoplasm     ROS:  Please see the history of present illness.  All other ROS reviewed and negative.     Physical Exam/Data:   Vitals:   03/20/23 2054 03/21/23 0033 03/21/23 0409 03/21/23 0725  BP: 117/77 109/73 106/61 105/74  Pulse: 83 68 81 92  Resp: 20 18 18 18   Temp: 97.8 F (36.6 C) 97.7 F (36.5 C) 97.7 F (36.5 C) (!) 97.5 F (36.4 C)  TempSrc: Oral Oral Oral Oral  SpO2: 95% 94% 92% 97%  Weight: 42.4 kg 42.4 kg    Height: 4\' 11"  (1.499 m)       Intake/Output Summary (Last 24 hours) at 03/21/2023 1136 Last data filed at 03/21/2023 0346 Gross per 24 hour  Intake 67.8 ml  Output --  Net 67.8 ml      03/21/2023   12:33 AM 03/20/2023    8:54 PM 03/20/2023    2:47 PM  Last 3 Weights  Weight (lbs) 93 lb 8 oz 93 lb 8 oz 95 lb 14.4 oz  Weight (kg) 42.411 kg 42.411 kg 43.5 kg     Body mass index is 18.88 kg/m.  General:  Well nourished, well developed, in no acute distress HEENT: normal Neck: no JVD Vascular: No carotid bruits; Distal pulses 2+  bilaterally Cardiac:  irreg-irreg; no murmurs gallops or rubs Lungs:  decreased at the bases b/l, no wheezing, rhonchi or rales  Abd: soft, nontender, no hepatomegaly  Ext: no edema Musculoskeletal:  No deformities, advanced atrophy Skin: warm and dry  Neuro:  no focal abnormalities noted Psych:  Normal affect   EKG:  The EKG was personally reviewed and demonstrates:   Coarse AFib 87bpm, QTc by machine 455ms  3/16: admit AFib 152bpm AFib 110bpm AFib 103bpm  S/p DCCV is SR 77bpm, PACunusual P wave morphology, QTc 473ms  OLD 04/13/22: SR 69bpm, , LAD, QTc 426  Telemetry:  Telemetry was personally reviewed and demonstrates:   AFib 80's  Relevant CV Studies:  03/05/23: TEE/DCCV Impression: No LAA thrombus Severe biatrial enlargement Mild MR, moderate TR, mild AI LVEF 60-65% Successful DCCV with a single 150J biphasic shock to NSR.   03/03/23; TTE 1. Left ventricular ejection fraction, by estimation, is 55 to 60%. The  left ventricle has normal function. The left ventricle has no regional  wall motion abnormalities. Left ventricular diastolic parameters are  indeterminate.   2. Right ventricular systolic function is normal. The right ventricular  size is normal. There is mildly elevated pulmonary artery systolic  pressure. The estimated right ventricular systolic pressure is [id number] mmHg.   3. Left atrial size was severely dilated.   4. Right atrial size was severely dilated.   5. Mild mitral valve regurgitation.   6. Tricuspid valve regurgitation is moderate.   7. The aortic valve was not well visualized. There is moderate  calcification of the aortic valve. Aortic valve regurgitation is moderate.   8. The inferior vena cava is normal in size with <50% respiratory  variability, suggesting right atrial pressure of 8 mmHg.   04/30/22: TTE 1. Left ventricular ejection fraction, by estimation, is 55 to 60%. The  left ventricle has normal function. The left ventricle has no  regional  wall motion abnormalities. Left ventricular diastolic parameters are  consistent with Grade II diastolic  dysfunction (pseudonormalization).   2.  Right ventricular systolic function is normal. The right ventricular  size is moderately enlarged. There is mildly elevated pulmonary artery  systolic pressure. The estimated right ventricular systolic pressure is  123456 mmHg.   3. Left atrial size was mildly dilated.   4. Right atrial size was mildly dilated.   5. The mitral valve is grossly normal. Mild mitral valve regurgitation.  No evidence of mitral stenosis.   6. The tricuspid valve is abnormal. Tricuspid valve regurgitation is  moderate to severe.   7. The aortic valve is calcified. Aortic valve regurgitation is moderate.   8. The inferior vena cava is normal in size with greater than 50%  respiratory variability, suggesting right atrial pressure of 3 mmHg.   Comparison(s): Changes from prior study are noted. EF unchanged. AI is now  moderate. TR still moderate to severe.   Laboratory Data:  High Sensitivity Troponin:   Recent Labs  Lab 03/02/23 1957  TROPONINIHS 8     Chemistry Recent Labs  Lab 03/20/23 1453 03/21/23 0035  NA 130* 131*  K 5.3* 3.9  CL 98 97*  CO2 22 27  GLUCOSE 156* 135*  BUN 33* 27*  CREATININE 1.12* 1.12*  CALCIUM 9.7 8.4*  MG  --  1.6*  GFRNONAA 48* 48*  ANIONGAP 10 7    No results for input(s): "PROT", "ALBUMIN", "AST", "ALT", "ALKPHOS", "BILITOT" in the last 168 hours. Lipids No results for input(s): "CHOL", "TRIG", "HDL", "LABVLDL", "LDLCALC", "CHOLHDL" in the last 168 hours.  Hematology Recent Labs  Lab 03/20/23 1453 03/21/23 0035  WBC 10.0 10.3  RBC 5.08 4.29  HGB 15.7* 13.4  HCT 46.2* 38.8  MCV 90.9 90.4  MCH 30.9 31.2  MCHC 34.0 34.5  RDW 13.5 13.4  PLT 394 281   Thyroid No results for input(s): "TSH", "FREET4" in the last 168 hours.  BNP Recent Labs  Lab 03/20/23 1453  BNP 1,086.9*    DDimer No results for  input(s): "DDIMER" in the last 168 hours.   Radiology/Studies:  DG Chest 2 View  Result Date: 03/20/2023 CLINICAL DATA:  Shortness of breath for the past week. EXAM: CHEST - 2 VIEW COMPARISON:  CT chest and chest x-ray dated March 02, 2023. FINDINGS: Unchanged cardiomegaly and small right greater than left pleural effusions. Normal pulmonary vascularity. Similar bibasilar scarring. No focal consolidation or pneumothorax. No acute osseous abnormality. IMPRESSION: 1. Unchanged small right greater than left pleural effusions. Electronically Signed   By: Titus Dubin M.D.   On: 03/20/2023 15:22     Assessment and Plan:   New Afib last month Unfortunately had ERAF post DCCV 03/05/23 She carries a diagnosis of bronchiectasis so amiodarone not ideal With clinical HF I dont thin flecainide (?) is on the table, neither are multaq or sotalol.  Tikosyn is a good option She is not on any contraindicated drugs QTc looks OK  Would pursue once she is back at a euvolemic state (she appears close) with stable lytes.  I discussed Tikosyn with the patient potential risks/benefits, and extended hospitalization, she would like to proceed pending her conversation with EP MD She in rate controlled AFib feels better  She got mag 4g I will give her some K+ Labs in AM Calc CrCl with current Cr is only 25 (26 using ideal weight)  (using her baseline 0.9 Creat is 31 and 32) Her only dose option is 134mcg     Acute/chronic CHF (diastolic) Getting IV lasix She appears close to euvolemia C/w attending cardiology  team    Risk Assessment/Risk Scores:    For questions or updates, please contact [place] Please consult [email or web address] for contact info under    Signed, Baldwin Jamaica, PA-C  03/21/2023 11:36 AM

## 2023-03-21 NOTE — Care Management Obs Status (Signed)
[place] NOTIFICATION   Patient Details  Name: Sarah Lewis MRN: [id number] Date of Birth: 11-12-1936   Medicare Observation Status Notification Given:  Yes    Zenon Mayo, RN 03/21/2023, 2:42 PM

## 2023-03-22 ENCOUNTER — Other Ambulatory Visit (HOSPITAL_COMMUNITY): Payer: Self-pay

## 2023-03-22 ENCOUNTER — Other Ambulatory Visit: Payer: Self-pay

## 2023-03-22 DIAGNOSIS — I5033 Acute on chronic diastolic (congestive) heart failure: Secondary | ICD-10-CM | POA: Diagnosis present

## 2023-03-22 DIAGNOSIS — J449 Chronic obstructive pulmonary disease, unspecified: Secondary | ICD-10-CM | POA: Diagnosis present

## 2023-03-22 DIAGNOSIS — Z853 Personal history of malignant neoplasm of breast: Secondary | ICD-10-CM | POA: Diagnosis not present

## 2023-03-22 DIAGNOSIS — I130 Hypertensive heart and chronic kidney disease with heart failure and stage 1 through stage 4 chronic kidney disease, or unspecified chronic kidney disease: Secondary | ICD-10-CM | POA: Diagnosis present

## 2023-03-22 DIAGNOSIS — Z1152 Encounter for screening for COVID-19: Secondary | ICD-10-CM | POA: Diagnosis not present

## 2023-03-22 DIAGNOSIS — Z79899 Other long term (current) drug therapy: Secondary | ICD-10-CM | POA: Diagnosis not present

## 2023-03-22 DIAGNOSIS — J9601 Acute respiratory failure with hypoxia: Secondary | ICD-10-CM | POA: Diagnosis present

## 2023-03-22 DIAGNOSIS — I083 Combined rheumatic disorders of mitral, aortic and tricuspid valves: Secondary | ICD-10-CM | POA: Diagnosis present

## 2023-03-22 DIAGNOSIS — G4700 Insomnia, unspecified: Secondary | ICD-10-CM | POA: Diagnosis present

## 2023-03-22 DIAGNOSIS — Z9013 Acquired absence of bilateral breasts and nipples: Secondary | ICD-10-CM | POA: Diagnosis not present

## 2023-03-22 DIAGNOSIS — J479 Bronchiectasis, uncomplicated: Secondary | ICD-10-CM | POA: Diagnosis present

## 2023-03-22 DIAGNOSIS — I4819 Other persistent atrial fibrillation: Secondary | ICD-10-CM | POA: Diagnosis present

## 2023-03-22 DIAGNOSIS — M069 Rheumatoid arthritis, unspecified: Secondary | ICD-10-CM | POA: Diagnosis present

## 2023-03-22 DIAGNOSIS — N1831 Chronic kidney disease, stage 3a: Secondary | ICD-10-CM | POA: Diagnosis present

## 2023-03-22 DIAGNOSIS — E871 Hypo-osmolality and hyponatremia: Secondary | ICD-10-CM | POA: Diagnosis present

## 2023-03-22 DIAGNOSIS — E876 Hypokalemia: Secondary | ICD-10-CM | POA: Diagnosis present

## 2023-03-22 DIAGNOSIS — I272 Pulmonary hypertension, unspecified: Secondary | ICD-10-CM | POA: Diagnosis present

## 2023-03-22 DIAGNOSIS — E875 Hyperkalemia: Secondary | ICD-10-CM | POA: Diagnosis present

## 2023-03-22 DIAGNOSIS — I4891 Unspecified atrial fibrillation: Secondary | ICD-10-CM | POA: Diagnosis present

## 2023-03-22 DIAGNOSIS — I951 Orthostatic hypotension: Secondary | ICD-10-CM | POA: Diagnosis present

## 2023-03-22 DIAGNOSIS — Z7901 Long term (current) use of anticoagulants: Secondary | ICD-10-CM | POA: Diagnosis not present

## 2023-03-22 DIAGNOSIS — I471 Supraventricular tachycardia, unspecified: Secondary | ICD-10-CM | POA: Diagnosis present

## 2023-03-22 DIAGNOSIS — M810 Age-related osteoporosis without current pathological fracture: Secondary | ICD-10-CM | POA: Diagnosis present

## 2023-03-22 DIAGNOSIS — J849 Interstitial pulmonary disease, unspecified: Secondary | ICD-10-CM | POA: Diagnosis present

## 2023-03-22 LAB — CBC
HCT: 39.9 % (ref 36.0–46.0)
Hemoglobin: 13.8 g/dL (ref 12.0–15.0)
MCH: 31.4 pg (ref 26.0–34.0)
MCHC: 34.6 g/dL (ref 30.0–36.0)
MCV: 90.9 fL (ref 80.0–100.0)
Platelets: 267 10*3/uL (ref 150–400)
RBC: 4.39 MIL/uL (ref 3.87–5.11)
RDW: 13.4 % (ref 11.5–15.5)
WBC: 11.1 10*3/uL — ABNORMAL HIGH (ref 4.0–10.5)
nRBC: 0 % (ref 0.0–0.2)

## 2023-03-22 LAB — BASIC METABOLIC PANEL
Anion gap: 8 (ref 5–15)
Anion gap: 8 (ref 5–15)
BUN: 27 mg/dL — ABNORMAL HIGH (ref 8–23)
BUN: 23 mg/dL (ref 8–23)
CO2: 26 mmol/L (ref 22–32)
CO2: 28 mmol/L (ref 22–32)
Calcium: 8 mg/dL — ABNORMAL LOW (ref 8.9–10.3)
Calcium: 7.9 mg/dL — ABNORMAL LOW (ref 8.9–10.3)
Chloride: 96 mmol/L — ABNORMAL LOW (ref 98–111)
Chloride: 98 mmol/L (ref 98–111)
Creatinine, Ser: 1.19 mg/dL — ABNORMAL HIGH (ref 0.44–1.00)
Creatinine, Ser: 1.06 mg/dL — ABNORMAL HIGH (ref 0.44–1.00)
GFR, Estimated: 45 mL/min — ABNORMAL LOW (ref >60)
GFR, Estimated: 52 mL/min — ABNORMAL LOW (ref >60)
Glucose, Bld: 109 mg/dL — ABNORMAL HIGH (ref 70–99)
Glucose, Bld: 127 mg/dL — ABNORMAL HIGH (ref 70–99)
Potassium: 3.7 mmol/L (ref 3.5–5.1)
Potassium: 4.1 mmol/L (ref 3.5–5.1)
Sodium: 130 mmol/L — ABNORMAL LOW (ref 135–145)
Sodium: 134 mmol/L — ABNORMAL LOW (ref 135–145)

## 2023-03-22 LAB — MAGNESIUM: Magnesium: 2.3 mg/dL (ref 1.7–2.4)

## 2023-03-22 MED ORDER — SODIUM CHLORIDE 0.9 % IV SOLN: 250.0000 mL | INTRAVENOUS | Status: DC | PRN

## 2023-03-22 MED ORDER — FUROSEMIDE 10 MG/ML IJ SOLN
20.0000 mg | Freq: Every day | INTRAMUSCULAR | Status: DC
  Administered 2023-03-23: 20 mg via INTRAVENOUS
  Filled 2023-03-22: qty 2

## 2023-03-22 MED ORDER — DOFETILIDE 125 MCG PO CAPS
125.0000 ug | ORAL_CAPSULE | Freq: Two times a day (BID) | ORAL | Status: DC
  Administered 2023-03-22 – 2023-03-25 (×6): 125 ug via ORAL
  Filled 2023-03-22 (×8): qty 1

## 2023-03-22 MED ORDER — DOFETILIDE 125 MCG PO CAPS
125.0000 ug | ORAL_CAPSULE | Freq: Two times a day (BID) | ORAL | Status: DC
Start: 2023-03-22 — End: 2023-03-22

## 2023-03-22 MED ORDER — SODIUM CHLORIDE 0.9% FLUSH
3.0000 mL | Freq: Two times a day (BID) | INTRAVENOUS | Status: DC
  Administered 2023-03-22 – 2023-03-25 (×6): 3 mL via INTRAVENOUS

## 2023-03-22 MED ORDER — POTASSIUM CHLORIDE CRYS ER 20 MEQ PO TBCR
40.0000 meq | EXTENDED_RELEASE_TABLET | Freq: Once | ORAL | Status: AC
  Administered 2023-03-22: 40 meq via ORAL
  Filled 2023-03-22: qty 2

## 2023-03-22 MED ORDER — SODIUM CHLORIDE 0.9% FLUSH: 3.0000 mL | INTRAVENOUS | Status: DC | PRN

## 2023-03-22 NOTE — Progress Notes (Addendum)
Pharmacy Review for Dofetilide (Tikosyn) Initiation  Admit Complaint: 87 y.o. female admitted 03/20/2023 with atrial fibrillation to be initiated on dofetilide.   Assessment:  Patient Exclusion Criteria: If any screening criteria checked as "Yes", then  patient  should NOT receive dofetilide until criteria item is corrected. If "Yes" please indicate correction plan.  YES  NO Patient  Exclusion Criteria Correction Plan  []  [x]  Baseline QTc interval is greater than or equal to 440 msec. IF above YES box checked dofetilide contraindicated unless patient has ICD; then may proceed if QTc 500-550 msec or with known ventricular conduction abnormalities may proceed with QTc 550-600 msec. QTc =   Qtc on 12-Lead EKG 03/20/23 was 469. EP noted on 4/4 that this Qtc was ok in progress note.     []  [x]  Magnesium level is less than 1.8 mEq/l : Last magnesium:  Lab Results  Component Value Date   MG 2.3 03/22/2023         [x]  []  Potassium level is less than 4 mEq/l : Last potassium:  Lab Results  Component Value Date   K 3.7 03/22/2023       KCl oral once ordered 4/5 at 1030 AM by Francis Dowse, PA.   Dr. Jens Som ordered Lasix 40 IV x1 after this - will repeat KCl this PM to ensure.   []  [x]  Patient is known or suspected to have a digoxin level greater than 2 ng/ml: No results found for: "DIGOXIN"    []  [x]  Creatinine clearance less than 20 ml/min (calculated using Cockcroft-Gault, actual body weight and serum creatinine): Estimated Creatinine Clearance: 23.6 mL/min (A) (by C-G formula based on SCr of 1.19 mg/dL (H)).  Will need to monitor closely.   []  [x]  Patient has received drugs known to prolong the QT intervals within the last 48 hours (phenothiazines, tricyclics or tetracyclic antidepressants, erythromycin, H-1 antihistamines, cisapride, fluoroquinolones, azithromycin). Drugs not listed above may have an, as yet, undetected potential to prolong the QT interval, updated information on QT  prolonging agents is available at this website:QT prolonging agents   []  [x]  Patient received a dose of hydrochlorothiazide (Oretic) alone or in any combination including triamterene (Dyazide, Maxzide) in the last 48 hours.   []  [x]  Patient received a medication known to increase dofetilide plasma concentrations prior to initial dofetilide dose:  Trimethoprim (Primsol, Proloprim) in the last 36 hours Verapamil (Calan, Verelan) in the last 36 hours or a sustained release dose in the last 72 hours Megestrol (Megace) in the last 5 days  Cimetidine (Tagamet) in the last 6 hours Ketoconazole (Nizoral) in the last 24 hours Itraconazole (Sporanox) in the last 48 hours  Prochlorperazine (Compazine) in the last 36 hours  Patient is noted to be on Diltiazem which poses a moderate interaction and will need to be closely monitored.   []  [x]  Patient is known to have a history of torsades de pointes; congenital or acquired long QT syndromes.   []  [x]  Patient has received a Class 1 antiarrhythmic with less than 2 half-lives since last dose. (Disopyramide, Quinidine, Procainamide, Lidocaine, Mexiletine, Flecainide, Propafenone)   []  [x]  Patient has received amiodarone therapy in the past 3 months or amiodarone level is greater than 0.3 ng/ml.    Patient has been appropriately anticoagulated with Apixaban 2.5 po BID.  Ordering provider was confirmed at [email or web address] if they are not listed on the [facility] Authorized Prescribers list.  Goal of Therapy: Follow renal function, electrolytes, potential drug interactions, and dose adjustment. Provide education  and 1 week supply at discharge.  Plan:  [x]   Physician selected initial dose within range recommended for patients level of renal function - will monitor for response.  []   Physician selected initial dose outside of range recommended for patients level of renal function - will discuss if the dose should be altered at this time.   Select One Calculated  CrCl  Dose q12h  []  > 60 ml/min 500 mcg  []  40-60 ml/min 250 mcg  [x]  20-40 ml/min 125 mcg   2. Follow up QTc after the first 5 doses, renal function, electrolytes (K & Mg) daily x 3     days, dose adjustment, success of initiation and facilitate 1 week discharge supply as     clinically indicated.  3. Initiate Tikosyn education video (Call [phone] and ask for Tikosyn Video # [phone]).  4. Place Enrollment Form on the chart for discharge supply of dofetilide.   Fayne Norrie 9:34 AM 03/22/2023

## 2023-03-22 NOTE — Progress Notes (Signed)
Progress Note   Patient: Sarah Lewis [id number] DOB: 1936/01/22 DOA: 03/20/2023     1 DOS: the patient was seen and examined on 03/22/2023   Brief hospital course: Mrs. Sarah Atesndrews was admitted to the hospital with the working diagnosis of atrial fibrillation with RVR.   87 yo female with the past medical history of atrial fibrillation, heart failure, RA, ILD and breast cancer who presented with dyspnea, fatigue and palpitations. Recent hospitalization for heart failure and atrial fibrillation 03/16 to 03/06/23, requiring direct current cardioversion. Apparently patient doing well at home until 2 to 3 days ago when she developed palpitations and dyspnea. She was evaluated be cardiology one day prior to this admission and she declined hospitalization, then her metoprolol dose was increased. Because persistent symptoms she came to the ED. On her initial physical examination her blood pressure was 140/96, HR 130, RR 27 and 02 saturation 87%, patient was placed on supplemental 02, diltiazem infusion and received 40 mg IV furosemide, prior to [facility].  At the time of her transfer her HR 80, blood pressure 125/88, and 02 saturation 98% with RR 22. Lungs with no wheezing or rales, heart with S1 and S2 present irregularly irregular with no gallops, abdomen with no distention and trace lower extremity edema.   Na 130, K 53, CL 98 bicarbonate 22, glucose 156, bun 33 cr 1,12 BNP 1,086  Wbc 10,0 hgb 15,7 plt 394  Sars covid 19 negative   Chest radiograph with hyperinflation, cephalization of the vasculature and small bilateral pleural effusions, more right than left.   EKG 131 bpm, left axis deviation, normal qtc, atrial fibrillation rhythm, with no significant ST segment or T wave changes.   Patient was placed on diltiazem infusion for rate control.  Diuresis with furosemide.  Patient was placed on Dofetilide for rhythm control.   04/05 plan to continue telemetry monitoring, if  persistent a fib plan for direct current cardioversion tomorrow.   Assessment and Plan: * Atrial fibrillation with RVR No further palpitations but dizziness. 03/19 direct current cardioversion.  Heart rate is 80 to 90, persistent atrial fibrillation.   Plan to continue diltiazem IV for rate control, 5 mg/ hr.  Metoprolol succinate 100 mg daily.  Anticoagulation with apixaban Plan to start patient on dofetilide today per EP recommendations.  Keep K at 4 and Mg at 2.  Continue telemetry monitoring.   Acute on chronic diastolic CHF (congestive heart failure) Echocardiogram with preserved LV systolic function EF 55 to 60%, no LVH, RV with normal systolic function, RVSP 41.2, LA and RA with severe dilatation, no pericardial effusion, moderate TR, moderate aortic valve regurgitation.  Documented urine output is 400 cc Systolic blood pressure 105 to 106 mmHg.   Plan to continue rate control atrial fibrillation. Metoprolol succinate 100 mg   Limited further goal directed therapy due to risk of hypotension.  Positive orthostatics with standing blood pressure 84/62 mmHg.  Continue with SGLT 2 inh.  Furosemide IV 20 mg per cardiology recommendations.   Acute hypoxemic respiratory failure due to cardiogenic pulmonary edema with bilateral pleural effusions.  Continue diuresis with furosemide 02 saturation today is 91% on room air.    Hyponatremia CKD stage 3a. Hyponatremia, hypomagnesemia and hyperkalemia  Renal function stable with serum cr at 1,0 with K at 4,1 and serum bicarbonate at 28. Na is 134 Mg 2.3   Plan to continue close follow up on renal function and electrolytes.   COPD (chronic obstructive pulmonary disease) No  clinical signs of exacerbation, continue oxymetry monitoring.   Rheumatoid arthritis No active flare. PT and Ot.         Subjective: Patient is feeling better, she ambulated today with no dizziness.   Physical Exam: Vitals:   03/22/23 0124 03/22/23  0532 03/22/23 0714 03/22/23 1301  BP:  123/72 119/75 (!) 95/59  Pulse:  83 70 90  Resp:  19 18 18   Temp:  (!) 97.5 F (36.4 C) 97.6 F (36.4 C) 97.6 F (36.4 C)  TempSrc:  Oral Oral Oral  SpO2: 92% 95% 96% 91%  Weight:      Height:       Neurology awake and alert ENT With mild pallor Cardiovascular with S1 and S2 present, irregularly irregular with no gallops, rubs or murmurs No JVD No lower extremity edema Respiratory with rales bilaterally with no wheezing or rhonchi Abdomen with no distention  Data Reviewed:    Family Communication: no family at the bedside   Disposition: Status is: Observation The patient will require care spanning > 2 midnights and should be moved to inpatient because: telemetry monitoring, antiarrhythmic therapy   Planned Discharge Destination: Home     Author: Coralie Keens, MD 03/22/2023 3:03 PM  For on call review [email or web address].

## 2023-03-22 NOTE — Progress Notes (Signed)
EKG obtained for [employer]. QRS is 61ms and called CCMD QTC is . Cardiology on call paged.

## 2023-03-22 NOTE — Progress Notes (Signed)
Heart Failure Navigator Progress Note  Assessed for Heart & Vascular TOC clinic readiness.  Patient EF 55-60% HFpEF, No HF TOC per Dr. Ella Jubilee. .   Navigator will sign off at this time.  Rhae Hammock, BSN, Scientist, clinical (histocompatibility and immunogenetics) Only

## 2023-03-22 NOTE — Progress Notes (Signed)
Rounding Note    Patient Name: Sarah Lewis Date of Encounter: 03/22/2023  [facility] Cardiologist: Olga Millers, MD   Subjective   No dyspnea or CP  Inpatient Medications    Scheduled Meds:  apixaban  2.5 mg Oral BID   dofetilide  125 mcg Oral BID   empagliflozin  10 mg Oral Daily   furosemide  40 mg Intravenous Daily   metoprolol succinate  100 mg Oral Daily   potassium chloride  40 mEq Oral Once   sodium chloride flush  3 mL Intravenous Q12H   sodium chloride flush  3 mL Intravenous Q12H   Continuous Infusions:  sodium chloride     diltiazem (CARDIZEM) infusion 5 mg/hr (03/22/23 0945)   PRN Meds: sodium chloride, acetaminophen **OR** acetaminophen, melatonin, ondansetron **OR** ondansetron (ZOFRAN) IV, polyethylene glycol, sodium chloride flush   Vital Signs    Vitals:   03/22/23 0009 03/22/23 0124 03/22/23 0532 03/22/23 0714  BP: 124/80  123/72 119/75  Pulse: 65  83 70  Resp: 19  19 18   Temp: (!) 97.5 F (36.4 C)  (!) 97.5 F (36.4 C) 97.6 F (36.4 C)  TempSrc: Oral  Oral Oral  SpO2: 90% 92% 95% 96%  Weight: 42.4 kg     Height:        Intake/Output Summary (Last 24 hours) at 03/22/2023 0954 Last data filed at 03/22/2023 0834 Gross per 24 hour  Intake 710.41 ml  Output 1000 ml  Net -289.59 ml       03/22/2023   12:09 AM 03/21/2023   12:33 AM 03/20/2023    8:54 PM  Last 3 Weights  Weight (lbs) 93 lb 8 oz 93 lb 8 oz 93 lb 8 oz  Weight (kg) 42.411 kg 42.411 kg 42.411 kg      Telemetry    Atrial fibrillation rate controlled - Personally Reviewed   Physical Exam   GEN: NAD.  Frail Neck: supple Cardiac: irregular, normal rate Respiratory: Diminished BS RLL; no wheeze GI: Soft, NT/ND MS: No edema Neuro:  Grossly intact Psych: Normal affect   Labs    High Sensitivity Troponin:   Recent Labs  Lab 03/02/23 1957  TROPONINIHS 8      Chemistry Recent Labs  Lab 03/20/23 1453 03/21/23 0035 03/22/23 0038  NA 130* 131* 130*   K 5.3* 3.9 3.7  CL 98 97* 96*  CO2 22 27 26   GLUCOSE 156* 135* 109*  BUN 33* 27* 27*  CREATININE 1.12* 1.12* 1.19*  CALCIUM 9.7 8.4* 8.0*  MG  --  1.6* 2.3  GFRNONAA 48* 48* 45*  ANIONGAP 10 7 8       Hematology Recent Labs  Lab 03/20/23 1453 03/21/23 0035 03/22/23 0038  WBC 10.0 10.3 11.1*  RBC 5.08 4.29 4.39  HGB 15.7* 13.4 13.8  HCT 46.2* 38.8 39.9  MCV 90.9 90.4 90.9  MCH 30.9 31.2 31.4  MCHC 34.0 34.5 34.6  RDW 13.5 13.4 13.4  PLT 394 281 267     BNP Recent Labs  Lab 03/20/23 1453  BNP 1,086.9*      Radiology    DG Chest 2 View  Result Date: 03/20/2023 CLINICAL DATA:  Shortness of breath for the past week. EXAM: CHEST - 2 VIEW COMPARISON:  CT chest and chest x-ray dated March 02, 2023. FINDINGS: Unchanged cardiomegaly and small right greater than left pleural effusions. Normal pulmonary vascularity. Similar bibasilar scarring. No focal consolidation or pneumothorax. No acute osseous abnormality. IMPRESSION: 1. Unchanged  small right greater than left pleural effusions. Electronically Signed   By: Obie Dredge M.D.   On: 03/20/2023 15:22      Patient Profile     87 y.o. female with past medical history of paroxysmal atrial fibrillation, chronic diastolic congestive heart failure, history of breast cancer status postmastectomy admitted with recurrent atrial fibrillation.  Recently discharged following admission for atrial fibrillation which was treated with TEE cardioversion.  However when she returned for follow-up atrial fibrillation had recurred.  Echocardiogram March 2024 showed normal LV function, severe biatrial enlargement, mild mitral regurgitation, moderate tricuspid regurgitation, moderate aortic insufficiency.  Transesophageal echocardiogram March 2024 showed normal LV function, severe left atrial enlargement, severe right atrial enlargement, mild mitral regurgitation, moderate tricuspid regurgitation and mild aortic insufficiency.  Assessment & Plan     1 paroxysmal atrial fibrillation-patient had recent TEE guided cardioversion with significant improvement in her dyspnea/palpitations.  However atrial fibrillation has recurred and is again associated with worsening CHF.  She will need an antiarrhythmic to help maintain sinus rhythm.  She is not a good candidate for amiodarone given underlying bronchiectasis.  Dr. Lalla Brothers has seen and Joice Lofts is to be initiated this evening.  Follow closely on telemetry and QTc.  Continue apixaban (she has missed no doses).  If she does not convert with Tikosyn will plan cardioversion Monday.  Note her LV function is normal.  CHA2DS2-VASc is 4.  Continue Cardizem and metoprolol for rate control.  2 acute on chronic diastolic congestive heart failure-symptoms are improved.  Not volume overloaded on examination.  Will decrease Lasix to 20 mg daily.  Follow renal function closely.  For questions or updates, please contact [facility] Please consult [email or web address] for contact info under        Signed, Olga Millers, MD  03/22/2023, 9:54 AM

## 2023-03-22 NOTE — Progress Notes (Signed)
Rounding Note    Patient Name: Sarah Lewis Date of Encounter: 03/22/2023  [facility] Cardiologist: Olga Millers, MD   Subjective   No SOB, no CP  Inpatient Medications    Scheduled Meds:  apixaban  2.5 mg Oral BID   empagliflozin  10 mg Oral Daily   furosemide  40 mg Intravenous Daily   metoprolol succinate  100 mg Oral Daily   sodium chloride flush  3 mL Intravenous Q12H   Continuous Infusions:  diltiazem (CARDIZEM) infusion 5 mg/hr (03/22/23 0316)   PRN Meds: acetaminophen **OR** acetaminophen, melatonin, ondansetron **OR** ondansetron (ZOFRAN) IV, polyethylene glycol   Vital Signs    Vitals:   03/22/23 0009 03/22/23 0124 03/22/23 0532 03/22/23 0714  BP: 124/80  123/72 119/75  Pulse: 65  83 70  Resp: Temp: (!) 97.5 F (36.4 C)  (!) 97.5 F (36.4 C) 97.6 F (36.4 C)  TempSrc: Oral  Oral Oral  SpO2: 90% 92% 95% 96%  Weight: 42.4 kg     Height:        Intake/Output Summary (Last 24 hours) at 03/22/2023 0923 Last data filed at 03/22/2023 0834 Gross per 24 hour  Intake 710.41 ml  Output 1000 ml  Net -289.59 ml      03/22/2023   12:09 AM 03/21/2023   12:33 AM 03/20/2023    8:54 PM  Last 3 Weights  Weight (lbs) 93 lb 8 oz 93 lb 8 oz 93 lb 8 oz  Weight (kg) 42.411 kg 42.411 kg 42.411 kg      Telemetry    AFib 70's mostly - Personally Reviewed  ECG    No new EKGs - Personally Reviewed  Physical Exam   GEN: No acute distress.   Neck: No JVD Cardiac: RRR, no murmurs, rubs, or gallops.  Respiratory: irreg-irreg. GI: Soft, nontender, non-distended  MS: No edema; No deformity. Neuro:  Nonfocal  Psych: Normal affect   Labs    High Sensitivity Troponin:   Recent Labs  Lab 03/02/23 1957  TROPONINIHS 8     Chemistry Recent Labs  Lab 03/20/23 1453 03/21/23 0035 03/22/23 0038  NA 130* 131* 130*  K 5.3* 3.9 3.7  CL 98 97* 96*  CO2 GLUCOSE 156* 135* 109*  BUN 33* 27* 27*  CREATININE 1.12* 1.12* 1.19*   CALCIUM 9.7 8.4* 8.0*  MG  --  1.6* 2.3  GFRNONAA 48* 48* 45*  ANIONGAP Lipids No results for input(s): "CHOL", "TRIG", "HDL", "LABVLDL", "LDLCALC", "CHOLHDL" in the last 168 hours.  Hematology Recent Labs  Lab 03/20/23 1453 03/21/23 0035 03/22/23 0038  WBC 10.0 10.3 11.1*  RBC 5.08 4.29 4.39  HGB 15.7* 13.4 13.8  HCT 46.2* 38.8 39.9  MCV 90.9 90.4 90.9  MCH 30.9 31.2 31.4  MCHC 34.0 34.5 34.6  RDW 13.5 13.4 13.4  PLT 394 281 267   Thyroid No results for input(s): "TSH", "FREET4" in the last 168 hours.  BNP Recent Labs  Lab 03/20/23 1453  BNP 1,086.9*    DDimer No results for input(s): "DDIMER" in the last 168 hours.   Radiology    DG Chest 2 View  Result Date: 03/20/2023 CLINICAL DATA:  Shortness of breath for the past week. EXAM: CHEST - 2 VIEW COMPARISON:  CT chest and chest x-ray dated March 02, 2023. FINDINGS: Unchanged cardiomegaly and small right greater than left pleural effusions. Normal pulmonary vascularity. Similar bibasilar  scarring. No focal consolidation or pneumothorax. No acute osseous abnormality. IMPRESSION: 1. Unchanged small right greater than left pleural effusions. Electronically Signed   By: Obie Dredge M.D.   On: 03/20/2023 15:22    Cardiac Studies   03/05/23: TEE/DCCV Impression: No LAA thrombus Severe biatrial enlargement Mild MR, moderate TR, mild AI LVEF 60-65% Successful DCCV with a single 150J biphasic shock to NSR.     03/03/23; TTE 1. Left ventricular ejection fraction, by estimation, is 55 to 60%. The  left ventricle has normal function. The left ventricle has no regional  wall motion abnormalities. Left ventricular diastolic parameters are  indeterminate.   2. Right ventricular systolic function is normal. The right ventricular  size is normal. There is mildly elevated pulmonary artery systolic  pressure. The estimated right ventricular systolic pressure is 41.2 mmHg.   3. Left atrial size was severely dilated.    4. Right atrial size was severely dilated.   5. Mild mitral valve regurgitation.   6. Tricuspid valve regurgitation is moderate.   7. The aortic valve was not well visualized. There is moderate  calcification of the aortic valve. Aortic valve regurgitation is moderate.   8. The inferior vena cava is normal in size with <50% respiratory  variability, suggesting right atrial pressure of 8 mmHg.    04/30/22: TTE 1. Left ventricular ejection fraction, by estimation, is 55 to 60%. The  left ventricle has normal function. The left ventricle has no regional  wall motion abnormalities. Left ventricular diastolic parameters are  consistent with Grade II diastolic  dysfunction (pseudonormalization).   2. Right ventricular systolic function is normal. The right ventricular  size is moderately enlarged. There is mildly elevated pulmonary artery  systolic pressure. The estimated right ventricular systolic pressure is  41.4 mmHg.   3. Left atrial size was mildly dilated.   4. Right atrial size was mildly dilated.   5. The mitral valve is grossly normal. Mild mitral valve regurgitation.  No evidence of mitral stenosis.   6. The tricuspid valve is abnormal. Tricuspid valve regurgitation is  moderate to severe.   7. The aortic valve is calcified. Aortic valve regurgitation is moderate.   8. The inferior vena cava is normal in size with greater than 50%  respiratory variability, suggesting right atrial pressure of 3 mmHg.   Comparison(s): Changes from prior study are noted. EF unchanged. AI is now  moderate. TR still moderate to severe.   Patient Profile     86 y.o. female  with a hx of breast cancer (b/l mastectomy/chemo), coronary Ca++ by CT, HTN, p.HTN, bronchiectasis, AFib admitted with recurrent Afib RVR and volume OL  Assessment & Plan    New Afib last month CHA2DS2Vasc is 4, on eliquis Unfortunately had ERAF post DCCV 03/05/23 She carries a diagnosis of bronchiectasis so amiodarone not  ideal With clinical HF I dont thin flecainide (?) is on the table, neither are multaq or sotalol.  Plan to start Tikosyn this evening She confirms no missed eliquis doses at home prior to coming in Revisited plan, and anticipated LOS, she remains agreeable.  She appears euvolemic  K+ 3.7 (replacement ordered) Mag 2.3 Creat 1.19 (calc CrCl is 24) will be a dose Review of her EKGs yesterday including last SR, acceptable I do not see any contraindicated meds  DCCV Sunday/Monday depending weekend availability, pt aware and agreeable    For questions or updates, please contact [facility] Please consult [email or web address] for contact info under  Signed, Sheilah Pigeonenee Lynn Macey Wurtz, PA-C  03/22/2023, 9:23 AM

## 2023-03-22 NOTE — TOC Benefit Eligibility Note (Signed)
Patient Product/process development scientist completed.    The patient is currently admitted and upon discharge could be taking dofetilide (Tikosyn) 125 mcg capsules.  The current 30 day co-pay is $10.00.   The patient is insured through [employer] Part D   This test claim was processed through [facility]- copay amounts may vary at other pharmacies due to pharmacy/plan contracts, or as the patient moves through the different stages of their insurance plan.  Roland Earl, CPHT Pharmacy Patient Advocate Specialist [facility] Patient Advocate Team Direct Number: [phone]  Fax: [phone]

## 2023-03-22 NOTE — Progress Notes (Signed)
Mobility Specialist Progress Note:   03/22/23 1032  Mobility  Activity Ambulated with assistance in hallway  Level of Assistance Standby assist, set-up cues, supervision of patient - no hands on  Assistive Device None;Other (Comment) (IV Pole)  Distance Ambulated (ft) 450 ft  Activity Response Tolerated well  Mobility Referral Yes  $Mobility charge 1 Mobility   Pt eager for mobility session. Required no physical assistance. Ambulated with IV Pole, and no AD. No unsteadiness noted. Pt denies dizziness/SOB. Back in chair with all needs met.   Addison Lank Mobility Specialist Please contact via SecureChat or  Rehab office at [phone]

## 2023-03-23 DIAGNOSIS — I5033 Acute on chronic diastolic (congestive) heart failure: Secondary | ICD-10-CM | POA: Diagnosis not present

## 2023-03-23 DIAGNOSIS — M069 Rheumatoid arthritis, unspecified: Secondary | ICD-10-CM | POA: Diagnosis not present

## 2023-03-23 DIAGNOSIS — E871 Hypo-osmolality and hyponatremia: Secondary | ICD-10-CM | POA: Diagnosis not present

## 2023-03-23 DIAGNOSIS — I4891 Unspecified atrial fibrillation: Secondary | ICD-10-CM | POA: Diagnosis not present

## 2023-03-23 LAB — BASIC METABOLIC PANEL
Anion gap: 9 (ref 5–15)
BUN: 24 mg/dL — ABNORMAL HIGH (ref 8–23)
CO2: 26 mmol/L (ref 22–32)
Calcium: 7.8 mg/dL — ABNORMAL LOW (ref 8.9–10.3)
Chloride: 99 mmol/L (ref 98–111)
Creatinine, Ser: 0.96 mg/dL (ref 0.44–1.00)
GFR, Estimated: 58 mL/min — ABNORMAL LOW (ref >60)
Glucose, Bld: 104 mg/dL — ABNORMAL HIGH (ref 70–99)
Potassium: 4 mmol/L (ref 3.5–5.1)
Sodium: 134 mmol/L — ABNORMAL LOW (ref 135–145)

## 2023-03-23 LAB — CBC
HCT: 40.5 % (ref 36.0–46.0)
Hemoglobin: 14 g/dL (ref 12.0–15.0)
MCH: 31.6 pg (ref 26.0–34.0)
MCHC: 34.6 g/dL (ref 30.0–36.0)
MCV: 91.4 fL (ref 80.0–100.0)
Platelets: 280 10*3/uL (ref 150–400)
RBC: 4.43 MIL/uL (ref 3.87–5.11)
RDW: 13.5 % (ref 11.5–15.5)
WBC: 9.5 10*3/uL (ref 4.0–10.5)
nRBC: 0 % (ref 0.0–0.2)

## 2023-03-23 LAB — MAGNESIUM: Magnesium: 2.2 mg/dL (ref 1.7–2.4)

## 2023-03-23 NOTE — Progress Notes (Addendum)
Progress Note   Patient: Sarah Lewis QPY:[id number] DOB: 1936-07-03 DOA: 03/20/2023     2 DOS: the patient was seen and examined on 03/23/2023   Brief hospital course: Mrs. Swem was admitted to the hospital with the working diagnosis of atrial fibrillation with RVR.   87 yo female with the past medical history of atrial fibrillation, heart failure, RA, ILD and breast cancer who presented with dyspnea, fatigue and palpitations. Recent hospitalization for heart failure and atrial fibrillation 03/16 to 03/06/23, requiring direct current cardioversion. Apparently patient doing well at home until 2 to 3 days ago when she developed palpitations and dyspnea. She was evaluated be cardiology one day prior to this admission and she declined hospitalization, then her metoprolol dose was increased. Because persistent symptoms she came to the ED. On her initial physical examination her blood pressure was 140/96, HR 130, RR 27 and 02 saturation 87%, patient was placed on supplemental 02, diltiazem infusion and received 40 mg IV furosemide, prior to transport to [facility] from [facility].  At the time of her transfer her HR 80, blood pressure 125/88, and 02 saturation 98% with RR 22. Lungs with no wheezing or rales, heart with S1 and S2 present irregularly irregular with no gallops, abdomen with no distention and trace lower extremity edema.   Na 130, K 53, CL 98 bicarbonate 22, glucose 156, bun 33 cr 1,12 BNP 1,086  Wbc 10,0 hgb 15,7 plt 394  Sars covid 19 negative   Chest radiograph with hyperinflation, cephalization of the vasculature and small bilateral pleural effusions, more right than left.   EKG 131 bpm, left axis deviation, normal qtc, atrial fibrillation rhythm, with no significant ST segment or T wave changes.   Patient was placed on diltiazem infusion for rate control.  Diuresis with furosemide.  Patient was placed on Dofetilide for rhythm control.   04/05 plan to continue telemetry monitoring, if  persistent a fib plan for direct current cardioversion tomorrow.  04/06 patient converted to sinus rhythm.   Assessment and Plan: * Atrial fibrillation with RVR No further palpitations but dizziness. 03/19 direct current cardioversion.  Patient has converted to sinus rhythm, telemetry with sinus rhythm with PAC, bigeminy and a brief episode of SVT.   Plan to continue to dofetilide  Metoprolol succinate 100 mg daily.  Discontinue diltiazem drip. Anticoagulation with apixaban Keep K at 4 and Mg at 2.  Continue telemetry monitoring.   Acute on chronic diastolic CHF (congestive heart failure) Echocardiogram with preserved LV systolic function EF 55 to 60%, no LVH, RV with normal systolic function, RVSP 41.2, LA and RA with severe dilatation, no pericardial effusion, moderate TR, moderate aortic valve regurgitation.  Blood pressure 134 to 110 mmHg.  Clinically euvolemic.  Plan to discontinue Iv furosemide Continue empagliflozin.  Limited pharmacologic options due to risk of hypotension, Continue metoprolol.  Acute hypoxemic respiratory failure due to cardiogenic pulmonary edema with bilateral pleural effusions.  Respiratory failure has resolved, today oxygenation is 94% on room air.    Hyponatremia CKD stage 3a. Hyponatremia, hypomagnesemia and hyperkalemia  Improved volume status, renal function with serum cr at 0,96 with K at 4,0 and serum bicarbonate at 26. Na 134 and mg 2,2   Plan to continue close follow up on renal function and electrolytes.   COPD (chronic obstructive pulmonary disease) No clinical signs of exacerbation, continue oxymetry monitoring.   Rheumatoid arthritis No active flare. PT and Ot.         Subjective: Patient is feeling better,  no dyspnea or chest pain, she is seating in the chair at the side of the bed.   Physical Exam: Vitals:   03/23/23 0359 03/23/23 0457 03/23/23 0718 03/23/23 1142  BP: 111/67  125/72 (!) 110/57  Pulse: 60  61 (!) 58   Resp: 17  20 20   Temp: 97.8 F (36.6 C)  97.9 F (36.6 C) 97.9 F (36.6 C)  TempSrc: Oral  Oral Oral  SpO2: 91%  99% 94%  Weight:  40.7 kg    Height:       Neurology awake and alert ENT with no pallor or icterus Cardiovascular with S1 and S2 present and regular with no gallops, rubs or murmurs No JVD No lower extremity edema Respiratory with no rales or wheezing Abdomen with no distention  Data Reviewed:    Family Communication: no family at the bedside   Disposition: Status is: Inpatient Remains inpatient appropriate because: telemetry monitoring, antiarrhythmic therapy   Planned Discharge Destination: Home     Author: Coralie KeensMauricio Daniel Vadim Centola, MD 03/23/2023 1:17 PM  For on call review [email or web address].

## 2023-03-23 NOTE — Progress Notes (Signed)
Rounding Note    Patient Name: Sarah Lewis Date of Encounter: 03/23/2023  [facility] Cardiologist: Olga Millers, MD   Subjective   No SOB, no CP. Doesn't feel significantly different today.  Rhythm converted to sinus yesterday evening.  Inpatient Medications    Scheduled Meds:  apixaban  2.5 mg Oral BID   dofetilide  125 mcg Oral BID   empagliflozin  10 mg Oral Daily   furosemide  20 mg Intravenous Daily   metoprolol succinate  100 mg Oral Daily   sodium chloride flush  3 mL Intravenous Q12H   sodium chloride flush  3 mL Intravenous Q12H   Continuous Infusions:  sodium chloride     diltiazem (CARDIZEM) infusion 5 mg/hr (03/22/23 1633)   PRN Meds: sodium chloride, acetaminophen **OR** acetaminophen, melatonin, ondansetron **OR** ondansetron (ZOFRAN) IV, polyethylene glycol, sodium chloride flush   Vital Signs    Vitals:   03/22/23 2355 03/23/23 0359 03/23/23 0457 03/23/23 0718  BP: 115/70 111/67  125/72  Pulse: (!) 58 60  61  Resp: 18 17  20   Temp: (!) 97.4 F (36.3 C) 97.8 F (36.6 C)  97.9 F (36.6 C)  TempSrc: Oral Oral  Oral  SpO2: 92% 91%  99%  Weight:   40.7 kg   Height:        Intake/Output Summary (Last 24 hours) at 03/23/2023 0757 Last data filed at 03/22/2023 2042 Gross per 24 hour  Intake 480 ml  Output --  Net 480 ml      03/23/2023    4:57 AM 03/22/2023   12:09 AM 03/21/2023   12:33 AM  Last 3 Weights  Weight (lbs) 89 lb 11.6 oz 93 lb 8 oz 93 lb 8 oz  Weight (kg) 40.7 kg 42.411 kg 42.411 kg      Telemetry    Sinus rhythm with frequent PACs - Personally Reviewed  ECG    Sinus rhythm, Qtc 448 - Personally Reviewed  Physical Exam   GEN: No acute distress.   Neck: No JVD Cardiac: RRR, no murmurs, rubs, or gallops.  Respiratory: regular. GI: Soft, nontender, non-distended  MS: No edema; No deformity. Neuro:  Nonfocal  Psych: Normal affect   Labs    High Sensitivity Troponin:   Recent Labs  Lab 03/02/23 1957   TROPONINIHS 8     Chemistry Recent Labs  Lab 03/21/23 0035 03/22/23 0038 03/22/23 1354 03/23/23 0033  NA 131* 130* 134* 134*  K 3.9 3.7 4.1 4.0  CL 97* 96* 98 99  CO2 27 26 28 26   GLUCOSE 135* 109* 127* 104*  BUN 27* 27* 23 24*  CREATININE 1.12* 1.19* 1.06* 0.96  CALCIUM 8.4* 8.0* 7.9* 7.8*  MG 1.6* 2.3  --  2.2  GFRNONAA 48* 45* 52* 58*  ANIONGAP 7 8 8 9     Lipids No results for input(s): "CHOL", "TRIG", "HDL", "LABVLDL", "LDLCALC", "CHOLHDL" in the last 168 hours.  Hematology Recent Labs  Lab 03/21/23 0035 03/22/23 0038 03/23/23 0033  WBC 10.3 11.1* 9.5  RBC 4.29 4.39 4.43  HGB 13.4 13.8 14.0  HCT 38.8 39.9 40.5  MCV 90.4 90.9 91.4  MCH 31.2 31.4 31.6  MCHC 34.5 34.6 34.6  RDW 13.4 13.4 13.5  PLT 281 267 280   Thyroid No results for input(s): "TSH", "FREET4" in the last 168 hours.  BNP Recent Labs  Lab 03/20/23 1453  BNP 1,086.9*    DDimer No results for input(s): "DDIMER" in the last 168 hours.  Radiology    No results found.  Cardiac Studies   03/05/23: TEE/DCCV Impression: No LAA thrombus Severe biatrial enlargement Mild MR, moderate TR, mild AI LVEF 60-65% Successful DCCV with a single 150J biphasic shock to NSR.     03/03/23; TTE 1. Left ventricular ejection fraction, by estimation, is 55 to 60%. The  left ventricle has normal function. The left ventricle has no regional  wall motion abnormalities. Left ventricular diastolic parameters are  indeterminate.   2. Right ventricular systolic function is normal. The right ventricular  size is normal. There is mildly elevated pulmonary artery systolic  pressure. The estimated right ventricular systolic pressure is 41.2 mmHg.   3. Left atrial size was severely dilated.   4. Right atrial size was severely dilated.   5. Mild mitral valve regurgitation.   6. Tricuspid valve regurgitation is moderate.   7. The aortic valve was not well visualized. There is moderate  calcification of the aortic  valve. Aortic valve regurgitation is moderate.   8. The inferior vena cava is normal in size with <50% respiratory  variability, suggesting right atrial pressure of 8 mmHg.    04/30/22: TTE 1. Left ventricular ejection fraction, by estimation, is 55 to 60%. The  left ventricle has normal function. The left ventricle has no regional  wall motion abnormalities. Left ventricular diastolic parameters are  consistent with Grade II diastolic  dysfunction (pseudonormalization).   2. Right ventricular systolic function is normal. The right ventricular  size is moderately enlarged. There is mildly elevated pulmonary artery  systolic pressure. The estimated right ventricular systolic pressure is  41.4 mmHg.   3. Left atrial size was mildly dilated.   4. Right atrial size was mildly dilated.   5. The mitral valve is grossly normal. Mild mitral valve regurgitation.  No evidence of mitral stenosis.   6. The tricuspid valve is abnormal. Tricuspid valve regurgitation is  moderate to severe.   7. The aortic valve is calcified. Aortic valve regurgitation is moderate.   8. The inferior vena cava is normal in size with greater than 50%  respiratory variability, suggesting right atrial pressure of 3 mmHg.   Comparison(s): Changes from prior study are noted. EF unchanged. AI is now  moderate. TR still moderate to severe.   Patient Profile     87 y.o. female  with a hx of breast cancer (b/l mastectomy/chemo), coronary Ca++ by CT, HTN, p.HTN, bronchiectasis, AFib admitted with recurrent Afib RVR and volume OL  Assessment & Plan    Persistent atrial fibrillation CHA2DS2Vasc is 4, on eliquis Unfortunately had ERAF post DCCV 03/05/23 She carries a diagnosis of bronchiectasis so amiodarone not ideal She has converted to sinus rhythm  High risk medication -- Tikosyn loading First dose, evening of 4/5 She confirms no missed eliquis doses at home prior to coming in Qtc is 448 -- ok to continue Tikosyn 125  mcg Keep K > 4, Mg > 2  CHFpEF She appears euvolemic    For questions or updates, please contact [facility] Please consult [email or web address] for contact info under        Signed, Maurice Small, MD  03/23/2023, 7:57 AM

## 2023-03-23 NOTE — Progress Notes (Signed)
Pharmacy: Dofetilide (Tikosyn) - Follow Up Assessment and Electrolyte Replacement  Pharmacy consulted to assist in monitoring and replacing electrolytes in this 87 y.o. female admitted on 03/20/2023 undergoing dofetilide initiation. First dofetilide dose: 03/21/22  Labs:    Component Value Date/Time   K 4.0 03/23/2023 0033   K 4.3 05/01/2017 1341   MG 2.2 03/23/2023 0033     Plan: Continue dofetilide 125 mcg BID   Potassium: K >/= 4: No additional supplementation needed  Magnesium: Mg > 2: No additional supplementation needed   Thank you for allowing pharmacy to participate in this patient's care   Jerry Caras, PharmD PGY1 Pharmacy Resident   03/23/2023 10:31 AM

## 2023-03-23 NOTE — Progress Notes (Signed)
Mobility Specialist Progress Note:   03/23/23 0900  Mobility  Activity Ambulated with assistance in hallway  Level of Assistance Standby assist, set-up cues, supervision of patient - no hands on  Assistive Device Other (Comment) (IV Pole)  Distance Ambulated (ft) 450 ft  Activity Response Tolerated well  Mobility Referral Yes  $Mobility charge 1 Mobility   Pt eager for mobility session. Required no physical assistance during ambulation with IV Pole. No c/o throughout. Pt back in chair with all needs met.  Addison Lank Mobility Specialist Please contact via SecureChat or  Rehab office at [phone]

## 2023-03-24 ENCOUNTER — Other Ambulatory Visit: Payer: Self-pay

## 2023-03-24 DIAGNOSIS — E871 Hypo-osmolality and hyponatremia: Secondary | ICD-10-CM | POA: Diagnosis not present

## 2023-03-24 DIAGNOSIS — I5033 Acute on chronic diastolic (congestive) heart failure: Secondary | ICD-10-CM | POA: Diagnosis not present

## 2023-03-24 DIAGNOSIS — I4891 Unspecified atrial fibrillation: Secondary | ICD-10-CM | POA: Diagnosis not present

## 2023-03-24 LAB — BASIC METABOLIC PANEL
Anion gap: 4 — ABNORMAL LOW (ref 5–15)
BUN: 19 mg/dL (ref 8–23)
CO2: 27 mmol/L (ref 22–32)
Calcium: 7.4 mg/dL — ABNORMAL LOW (ref 8.9–10.3)
Chloride: 99 mmol/L (ref 98–111)
Creatinine, Ser: 0.89 mg/dL (ref 0.44–1.00)
GFR, Estimated: >60 mL/min (ref >60)
Glucose, Bld: 89 mg/dL (ref 70–99)
Potassium: 4 mmol/L (ref 3.5–5.1)
Sodium: 130 mmol/L — ABNORMAL LOW (ref 135–145)

## 2023-03-24 LAB — MAGNESIUM: Magnesium: 2 mg/dL (ref 1.7–2.4)

## 2023-03-24 MED ORDER — MAGNESIUM SULFATE 2 GM/50ML IV SOLN
2.0000 g | Freq: Once | INTRAVENOUS | Status: AC
  Administered 2023-03-24: 2 g via INTRAVENOUS
  Filled 2023-03-24: qty 50

## 2023-03-24 NOTE — Progress Notes (Signed)
Mobility Specialist Progress Note:   03/24/23 0915  Mobility  Activity Ambulated independently in hallway  Level of Assistance Independent  Assistive Device None  Distance Ambulated (ft) 450 ft  Activity Response Tolerated well  Mobility Referral Yes  $Mobility charge 1 Mobility   Pt eager for mobility session. Required no physical assistance throughout. Denies dizziness, c/o minor SOB. Pt back in chair with all needs met.   Addison Lank Mobility Specialist Please contact via SecureChat or  Rehab office at [phone]

## 2023-03-24 NOTE — Progress Notes (Signed)
Rounding Note    Patient Name: Sarah Lewis Date of Encounter: 03/24/2023  [facility] Cardiologist: Olga MillersBrian Crenshaw, MD   Subjective   No SOB, no CP. Doesn't feel significantly different today.  She remains in sinus rhythm.  Inpatient Medications    Scheduled Meds:  apixaban  2.5 mg Oral BID   dofetilide  125 mcg Oral BID   empagliflozin  10 mg Oral Daily   metoprolol succinate  100 mg Oral Daily   sodium chloride flush  3 mL Intravenous Q12H   sodium chloride flush  3 mL Intravenous Q12H   Continuous Infusions:  sodium chloride     PRN Meds: sodium chloride, acetaminophen **OR** acetaminophen, melatonin, polyethylene glycol, sodium chloride flush   Vital Signs    Vitals:   03/24/23 0513 03/24/23 0514 03/24/23 0515 03/24/23 0845  BP:   (!) 141/85 132/72  Pulse:    72  Resp:   18 17  Temp:    97.8 F (36.6 C)  TempSrc:    Oral  SpO2:  94%  93%  Weight: 40.2 kg     Height:        Intake/Output Summary (Last 24 hours) at 03/24/2023 0909 Last data filed at 03/24/2023 0513 Gross per 24 hour  Intake 520 ml  Output 550 ml  Net -30 ml      03/24/2023    5:13 AM 03/23/2023    4:57 AM 03/22/2023   12:09 AM  Last 3 Weights  Weight (lbs) 88 lb 10 oz 89 lb 11.6 oz 93 lb 8 oz  Weight (kg) 40.2 kg 40.7 kg 42.411 kg      Telemetry    Sinus rhythm with frequent PACs - Personally Reviewed  ECG    Sinus rhythm, Qtc 444 - Personally Reviewed  Physical Exam   GEN: No acute distress.   Neck: No JVD Cardiac: RRR, no murmurs, rubs, or gallops.  Respiratory: regular. GI: Soft, nontender, non-distended  MS: No edema; No deformity. Neuro:  Nonfocal  Psych: Normal affect   Labs    High Sensitivity Troponin:   Recent Labs  Lab 03/02/23 1957  TROPONINIHS 8     Chemistry Recent Labs  Lab 03/22/23 0038 03/22/23 1354 03/23/23 0033 03/24/23 0038  NA 130* 134* 134* 130*  K 3.7 4.1 4.0 4.0  CL 96* 98 99 99  CO2 26 28 26 27   GLUCOSE 109* 127* 104*  89  BUN 27* 23 24* 19  CREATININE 1.19* 1.06* 0.96 0.89  CALCIUM 8.0* 7.9* 7.8* 7.4*  MG 2.3  --  2.2 2.0  GFRNONAA 45* 52* 58* >60  ANIONGAP 8 8 9  4*    Lipids No results for input(s): "CHOL", "TRIG", "HDL", "LABVLDL", "LDLCALC", "CHOLHDL" in the last 168 hours.  Hematology Recent Labs  Lab 03/21/23 0035 03/22/23 0038 03/23/23 0033  WBC 10.3 11.1* 9.5  RBC 4.29 4.39 4.43  HGB 13.4 13.8 14.0  HCT 38.8 39.9 40.5  MCV 90.4 90.9 91.4  MCH 31.2 31.4 31.6  MCHC 34.5 34.6 34.6  RDW 13.4 13.4 13.5  PLT 281 267 280   Thyroid No results for input(s): "TSH", "FREET4" in the last 168 hours.  BNP Recent Labs  Lab 03/20/23 1453  BNP 1,086.9*    DDimer No results for input(s): "DDIMER" in the last 168 hours.   Radiology    No results found.  Cardiac Studies   03/05/23: TEE/DCCV Impression: No LAA thrombus Severe biatrial enlargement Mild MR, moderate TR, mild  AI LVEF 60-65% Successful DCCV with a single 150J biphasic shock to NSR.     03/03/23; TTE 1. Left ventricular ejection fraction, by estimation, is 55 to 60%. The  left ventricle has normal function. The left ventricle has no regional  wall motion abnormalities. Left ventricular diastolic parameters are  indeterminate.   2. Right ventricular systolic function is normal. The right ventricular  size is normal. There is mildly elevated pulmonary artery systolic  pressure. The estimated right ventricular systolic pressure is 41.2 mmHg.   3. Left atrial size was severely dilated.   4. Right atrial size was severely dilated.   5. Mild mitral valve regurgitation.   6. Tricuspid valve regurgitation is moderate.   7. The aortic valve was not well visualized. There is moderate  calcification of the aortic valve. Aortic valve regurgitation is moderate.   8. The inferior vena cava is normal in size with <50% respiratory  variability, suggesting right atrial pressure of 8 mmHg.    04/30/22: TTE 1. Left ventricular ejection  fraction, by estimation, is 55 to 60%. The  left ventricle has normal function. The left ventricle has no regional  wall motion abnormalities. Left ventricular diastolic parameters are  consistent with Grade II diastolic  dysfunction (pseudonormalization).   2. Right ventricular systolic function is normal. The right ventricular  size is moderately enlarged. There is mildly elevated pulmonary artery  systolic pressure. The estimated right ventricular systolic pressure is  41.4 mmHg.   3. Left atrial size was mildly dilated.   4. Right atrial size was mildly dilated.   5. The mitral valve is grossly normal. Mild mitral valve regurgitation.  No evidence of mitral stenosis.   6. The tricuspid valve is abnormal. Tricuspid valve regurgitation is  moderate to severe.   7. The aortic valve is calcified. Aortic valve regurgitation is moderate.   8. The inferior vena cava is normal in size with greater than 50%  respiratory variability, suggesting right atrial pressure of 3 mmHg.   Comparison(s): Changes from prior study are noted. EF unchanged. AI is now  moderate. TR still moderate to severe.   Patient Profile     87 y.o. female  with a hx of breast cancer (b/l mastectomy/chemo), coronary Ca++ by CT, HTN, p.HTN, bronchiectasis, AFib admitted with recurrent Afib RVR and volume OL  Assessment & Plan    Persistent atrial fibrillation CHA2DS2Vasc is 4, on eliquis Unfortunately had ERAF post DCCV 03/05/23 She carries a diagnosis of bronchiectasis so amiodarone not ideal She has converted to sinus rhythm  High risk medication -- Tikosyn loading First dose, evening of 4/5 She confirms no missed eliquis doses at home prior to coming in Qtc is 448 -- ok to continue Tikosyn 125 mcg Keep K > 4, Mg > 2  CHFpEF She appears euvolemic    For questions or updates, please contact [facility] Please consult [email or web address] for contact info under        Signed, Maurice Small, MD   03/24/2023, 9:09 AM

## 2023-03-24 NOTE — Progress Notes (Signed)
Progress Note   Patient: Sarah Lewis JJO:[id number] DOB: [birth year] DOA: 03/20/2023     3 DOS: the patient was seen and examined on 03/24/2023   Brief hospital course: Mrs. Amyot was admitted to the hospital with the working diagnosis of atrial fibrillation with RVR.   87 yo female with the past medical history of atrial fibrillation, heart failure, RA, ILD and breast cancer who presented with dyspnea, fatigue and palpitations. Recent hospitalization for heart failure and atrial fibrillation 03/16 to 03/06/23, requiring direct current cardioversion. Apparently patient doing well at home until 2 to 3 days ago when she developed palpitations and dyspnea. She was evaluated be cardiology one day prior to this admission and she declined hospitalization, then her metoprolol dose was increased. Because persistent symptoms she came to the ED. On her initial physical examination her blood pressure was 140/96, HR 130, RR 27 and 02 saturation 87%, patient was placed on supplemental 02, diltiazem infusion and received 40 mg IV furosemide, prior to transport to [facility] from [facility].  At the time of her transfer her HR 80, blood pressure 125/88, and 02 saturation 98% with RR 22. Lungs with no wheezing or rales, heart with S1 and S2 present irregularly irregular with no gallops, abdomen with no distention and trace lower extremity edema.   Na 130, K 53, CL 98 bicarbonate 22, glucose 156, bun 33 cr 1,12 BNP 1,086  Wbc 10,0 hgb 15,7 plt 394  Sars covid 19 negative   Chest radiograph with hyperinflation, cephalization of the vasculature and small bilateral pleural effusions, more right than left.   EKG 131 bpm, left axis deviation, normal qtc, atrial fibrillation rhythm, with no significant ST segment or T wave changes.   Patient was placed on diltiazem infusion for rate control.  Diuresis with furosemide.  Patient was placed on Dofetilide for rhythm control.   04/05 plan to continue telemetry monitoring, if  persistent a fib plan for direct current cardioversion tomorrow.  04/06 patient converted to sinus rhythm.  04/07 continue sinus rhythm.   Assessment and Plan: * Atrial fibrillation with RVR 03/19 direct current cardioversion.  Telemetry with sinus rhythm with rate at 65 bpm range.   Plan to continue to dofetilide  Metoprolol succinate 100 mg daily.   Anticoagulation with apixaban Keep K at 4 and Mg at 2.  Continue telemetry monitoring, likely inpatient for the next 24 hrs.   Acute on chronic diastolic CHF (congestive heart failure) Echocardiogram with preserved LV systolic function EF 55 to 60%, no LVH, RV with normal systolic function, RVSP 41.2, LA and RA with severe dilatation, no pericardial effusion, moderate TR, moderate aortic valve regurgitation.  Blood pressure 132 to 104 mmHg.  Clinically euvolemic.  Continue empagliflozin and metoprolol.  Limited pharmacologic options due to risk of hypotension,  Acute hypoxemic respiratory failure due to cardiogenic pulmonary edema with bilateral pleural effusions.  Respiratory failure has resolved.  Hyponatremia CKD stage 3a. Hyponatremia, hypomagnesemia and hyperkalemia  Renal function today with serum cr at 0,86, K is 4,0 and serum bicarbonate at 27, Na 130 and Mg 2.0   Plan to continue close follow up on renal function and electrolytes.  Keep K at 4 and Mg at 2,0   COPD (chronic obstructive pulmonary disease) No clinical signs of exacerbation, continue oxymetry monitoring.   Rheumatoid arthritis No active flare. PT and Ot.         Subjective: Patient is out of bed into the chair, with no chest pain, no dyspnea, no dizziness.  Physical Exam: Vitals:   03/24/23 0514 03/24/23 0515 03/24/23 0845 03/24/23 1221  BP:  (!) 141/85 132/72 (!) 104/93  Pulse:   72 72  Resp:  18 17 17   Temp:   97.8 F (36.6 C) 97.6 F (36.4 C)  TempSrc:   Oral Oral  SpO2: 94%  93% 93%  Weight:      Height:       Neurology awake  and alert ENT with mild pallor with no icterus Cardiovascular with S1 and S2 present and regular with no gallops, rubs or murmurs Respiratory with no rales or wheezing Abdomen with no distention  Data Reviewed:    Family Communication: no family at the bedside   Disposition: Status is: Inpatient Remains inpatient appropriate because: telemetry monitoring on dofetilide   Planned Discharge Destination: Home     Author: Coralie Keens, MD 03/24/2023 1:16 PM  For on call review [email or web address].

## 2023-03-24 NOTE — Progress Notes (Signed)
Pharmacy: Dofetilide (Tikosyn) - Follow Up Assessment and Electrolyte Replacement  Pharmacy consulted to assist in monitoring and replacing electrolytes in this 87 y.o. female admitted on 03/20/2023 undergoing dofetilide initiation. First dofetilide dose: 03/21/22  Labs:    Component Value Date/Time   K 4.0 03/24/2023 0038   K 4.3 05/01/2017 1341   MG 2.0 03/24/2023 0038     Plan: Potassium: K >/= 4: No additional supplementation needed  Magnesium: Mg 1.8-2: Give Mg 2 gm IV x1    Thank you for allowing pharmacy to participate in this patient's care   Jerry Caras, PharmD PGY1 Pharmacy Resident   03/24/2023 12:54 PM

## 2023-03-25 ENCOUNTER — Encounter (HOSPITAL_COMMUNITY): Payer: Self-pay | Admitting: Cardiology

## 2023-03-25 ENCOUNTER — Other Ambulatory Visit (HOSPITAL_COMMUNITY): Payer: Self-pay

## 2023-03-25 ENCOUNTER — Encounter (HOSPITAL_COMMUNITY)
Admission: EM | Disposition: A | Discharge status: Home / Self Care | Payer: Self-pay | Source: Home / Self Care | Attending: Internal Medicine

## 2023-03-25 DIAGNOSIS — I5033 Acute on chronic diastolic (congestive) heart failure: Secondary | ICD-10-CM | POA: Diagnosis not present

## 2023-03-25 DIAGNOSIS — E871 Hypo-osmolality and hyponatremia: Secondary | ICD-10-CM | POA: Diagnosis not present

## 2023-03-25 DIAGNOSIS — I5032 Chronic diastolic (congestive) heart failure: Secondary | ICD-10-CM

## 2023-03-25 DIAGNOSIS — I4891 Unspecified atrial fibrillation: Secondary | ICD-10-CM

## 2023-03-25 DIAGNOSIS — Z006 Encounter for examination for normal comparison and control in clinical research program: Secondary | ICD-10-CM

## 2023-03-25 HISTORY — PX: LOOP RECORDER INSERTION: EP1214

## 2023-03-25 LAB — BASIC METABOLIC PANEL
Anion gap: 6 (ref 5–15)
BUN: 19 mg/dL (ref 8–23)
CO2: 26 mmol/L (ref 22–32)
Calcium: 8.2 mg/dL — ABNORMAL LOW (ref 8.9–10.3)
Chloride: 99 mmol/L (ref 98–111)
Creatinine, Ser: 0.89 mg/dL (ref 0.44–1.00)
GFR, Estimated: >60 mL/min (ref >60)
Glucose, Bld: 84 mg/dL (ref 70–99)
Potassium: 4.3 mmol/L (ref 3.5–5.1)
Sodium: 131 mmol/L — ABNORMAL LOW (ref 135–145)

## 2023-03-25 LAB — MAGNESIUM: Magnesium: 2.3 mg/dL (ref 1.7–2.4)

## 2023-03-25 SURGERY — CARDIOVERSION
Anesthesia: General

## 2023-03-25 SURGERY — LOOP RECORDER INSERTION

## 2023-03-25 MED ORDER — LIDOCAINE-EPINEPHRINE 1 %-1:100000 IJ SOLN
INTRAMUSCULAR | Status: AC
  Filled 2023-03-25: qty 1

## 2023-03-25 MED ORDER — EMPAGLIFLOZIN 10 MG PO TABS
10.0000 mg | ORAL_TABLET | Freq: Every day | ORAL | 0 refills | Status: AC
  Filled 2023-03-25: qty 30, 30d supply, fill #0

## 2023-03-25 MED ORDER — LIDOCAINE-EPINEPHRINE 1 %-1:100000 IJ SOLN
INTRAMUSCULAR | Status: DC | PRN
  Administered 2023-03-25: 10 mL

## 2023-03-25 MED ORDER — METOPROLOL SUCCINATE ER 100 MG PO TB24
100.0000 mg | ORAL_TABLET | Freq: Every day | ORAL | 0 refills | Status: DC
  Filled 2023-03-25: qty 30, 30d supply, fill #0

## 2023-03-25 MED ORDER — DOFETILIDE 125 MCG PO CAPS
125.0000 ug | ORAL_CAPSULE | Freq: Two times a day (BID) | ORAL | 0 refills | Status: DC
  Filled 2023-03-25: qty 60, 30d supply, fill #0

## 2023-03-25 SURGICAL SUPPLY — 2 items
PACK LOOP INSERTION (CUSTOM PROCEDURE TRAY) ×1 IMPLANT
SYSTEM MONITOR REVEAL LINQ II (Prosthesis & Implant Heart) IMPLANT

## 2023-03-25 NOTE — Plan of Care (Signed)
  Problem: Education: Goal: Knowledge of General Education information will improve Description Including pain rating scale, medication(s)/side effects and non-pharmacologic comfort measures Outcome: Progressing   Problem: Health Behavior/Discharge Planning: Goal: Ability to manage health-related needs will improve Outcome: Progressing   Problem: Clinical Measurements: Goal: Ability to maintain clinical measurements within normal limits will improve Outcome: Progressing Goal: Will remain free from infection Outcome: Progressing Goal: Diagnostic test results will improve Outcome: Progressing Goal: Respiratory complications will improve Outcome: Progressing Goal: Cardiovascular complication will be avoided Outcome: Progressing   Problem: Activity: Goal: Risk for activity intolerance will decrease Outcome: Progressing   Problem: Nutrition: Goal: Adequate nutrition will be maintained Outcome: Progressing   Problem: Coping: Goal: Level of anxiety will decrease Outcome: Progressing   Problem: Elimination: Goal: Will not experience complications related to bowel motility Outcome: Progressing Goal: Will not experience complications related to urinary retention Outcome: Progressing   Problem: Pain Managment: Goal: General experience of comfort will improve Outcome: Progressing   Problem: Safety: Goal: Ability to remain free from injury will improve Outcome: Progressing   Problem: Skin Integrity: Goal: Risk for impaired skin integrity will decrease Outcome: Progressing   Problem: Education: Goal: Knowledge of disease or condition will improve Outcome: Progressing Goal: Understanding of medication regimen will improve Outcome: Progressing Goal: Individualized Educational Video(s) Outcome: Progressing   Problem: Activity: Goal: Ability to tolerate increased activity will improve Outcome: Progressing   Problem: Cardiac: Goal: Ability to achieve and maintain  adequate cardiopulmonary perfusion will improve Outcome: Progressing   Problem: Health Behavior/Discharge Planning: Goal: Ability to safely manage health-related needs after discharge will improve Outcome: Progressing   Problem: Education: Goal: Ability to demonstrate management of disease process will improve Outcome: Progressing Goal: Ability to verbalize understanding of medication therapies will improve Outcome: Progressing Goal: Individualized Educational Video(s) Outcome: Progressing   Problem: Activity: Goal: Capacity to carry out activities will improve Outcome: Progressing   Problem: Cardiac: Goal: Ability to achieve and maintain adequate cardiopulmonary perfusion will improve Outcome: Progressing   

## 2023-03-25 NOTE — TOC Transition Note (Addendum)
Transition of Care [facility]) - CM/SW Discharge Note   Patient Details  Name: Sarah Lewis MRN: [id number] Date of Birth: Oct 16, 1936  [facility] CM/SW Contact:  Leone Haven, RN Phone Number: 03/25/2023, 2:10 PM   Clinical Narrative:    Patient is s/p loop recorder, for dc home today, spouse is at the bedside and will transport her home.  She is indep, has no needs. TOC to fill meds. Benefit check for Tikosyn) 125  is 10.00.         Patient Goals and CMS Choice      Discharge Placement                         Discharge Plan and Services Additional resources added to the After Visit Summary for                                       Social Determinants of Health (SDOH) Interventions SDOH Screenings   Tobacco Use: Low Risk  (03/25/2023)     Readmission Risk Interventions     No data to display

## 2023-03-25 NOTE — Progress Notes (Addendum)
   Electrophysiology Rounding Note  Patient Name: Sarah Lewis Date of Encounter: 03/25/2023  Primary Cardiologist: Olga Millers, MD  Electrophysiologist: None    Subjective   Pt remains in NSR on Tikosyn 125 mcg BID   QTc from EKG last pm shows stable QTc at ~440-450  The patient is doing well today.  At this time, the patient denies chest pain, shortness of breath, or any new concerns.  Inpatient Medications    Scheduled Meds:  apixaban  2.5 mg Oral BID   dofetilide  125 mcg Oral BID   empagliflozin  10 mg Oral Daily   metoprolol succinate  100 mg Oral Daily   sodium chloride flush  3 mL Intravenous Q12H   sodium chloride flush  3 mL Intravenous Q12H   Continuous Infusions:  sodium chloride     PRN Meds: sodium chloride, acetaminophen **OR** acetaminophen, melatonin, polyethylene glycol, sodium chloride flush   Vital Signs    Vitals:   03/24/23 2025 03/25/23 0039 03/25/23 0449 03/25/23 0500  BP: (!) 118/91 (!) 139/96 (!) 157/80   Pulse: 67 65 71   Resp: 20 20 20    Temp: 98.7 F (37.1 C) 97.7 F (36.5 C) 97.8 F (36.6 C)   TempSrc: Oral Oral Oral   SpO2: 93% 94% 94%   Weight:  40.7 kg  40.7 kg  Height:        Intake/Output Summary (Last 24 hours) at 03/25/2023 0700 Last data filed at 03/24/2023 1700 Gross per 24 hour  Intake 290 ml  Output 300 ml  Net -10 ml   Filed Weights   03/24/23 0513 03/25/23 0039 03/25/23 0500  Weight: 40.2 kg 40.7 kg 40.7 kg    Physical Exam    GEN- NAD, A&O x 3. Normal affect.  Lungs- CTAB, Normal effort.  Heart- Regular rate and rhythm. No M/G/R GI- Soft, NT, ND Extremities- No clubbing, cyanosis, or edema Skin- no rash or lesion  Labs    CBC Recent Labs    03/23/23 0033  WBC 9.5  HGB 14.0  HCT 40.5  MCV 91.4  PLT 280   Basic Metabolic Panel Recent Labs    29/19/16 0038 03/25/23 0047  NA 130* 131*  K 4.0 4.3  CL 99 99  CO2 27 26  GLUCOSE 89 84  BUN 19 19  CREATININE 0.89 0.89  CALCIUM 7.4* 8.2*   MG 2.0 2.3    Telemetry    NSR60-70s (personally reviewed)  Patient Profile     Sarah Lewis is a 87 y.o. female with a past medical history significant for persistent atrial fibrillation.  She was admitted with worsening SOB and AF RVR, EP consutled who recommended Tikosyn with underlying resp disease.   Tikosyn first dose 4/5 -> converted to sinus on medicine Plan for Loop for Alleviate HF 4/8  Assessment & Plan    Persistent atrial fibrillation Pt converted to sinus rhythm on Tikosyn 125 mcg BID  Continue Eliquis Creatinine, ser  0.89 (04/08 0047) Magnesium  2.3 (04/08 0047) Potassium4.3 (04/08 0047) No electrolyte supplementation needed  Acute on chronic diastolic CHF Better after IV lasix earlier this admit Plan for Loop/Alleviate HF today.   For questions or updates, please contact [facility] Please consult [email or web address] for contact info under Cardiology/STEMI.  Signed, Graciella Freer, PA-C  03/25/2023, 7:00 AM

## 2023-03-25 NOTE — Discharge Instructions (Signed)
Care After Your Loop Recorder  You have a [employer] Loop Recorder   Monitor your cardiac device site for redness, swelling, and drainage. Call the device clinic at [phone] if you experience these symptoms or fever/chills.  If you notice bleeding from your site, hold firm, but gently pressure with two fingers for 5 minutes. Dried blood on the steri-strips when removing the outer bandage is normal.   Keep the large square bandage on your site for 24 hours and then you may remove it yourself. Keep the steri-strips underneath in place.   You may shower after 72 hours / 3 days from your procedure with the steri-strips in place. They will usually fall off on their own, or may be removed after 10 days. Pat dry.   Avoid lotions, ointments, or perfumes over your incision until it is well-healed.  Please do not submerge in water until your site is completely healed.   Your device is MRI compatible.   Remote monitoring is used to monitor your cardiac device from home. This monitoring is scheduled every month by our office. It allows us to keep an eye on the function of your device to ensure it is working properly.    

## 2023-03-25 NOTE — Discharge Summary (Addendum)
Physician Discharge Summary   Patient: Sarah Lewis MRN: [id number] DOB: Jan 06, 1936  Admit date:     03/20/2023  Discharge date: 03/25/23  Discharge Physician: Coralie Keens   PCP: [name], MD   Recommendations at discharge:    Patient has been placed on dofetilide for rhythm control, and continue with metoprolol succinate. Had loop recorder implanted.  Added SGLT 2 inh for diastolic heart failure.  Continue anticoagulation with apixaban. Follow up renal function in 7 days. Follow up with Dr Katrinka Blazing in 7 to 10 days.  Follow up with EP/ cardiology as scheduled.   Discharge Diagnoses: Principal Problem:   Atrial fibrillation with RVR Active Problems:   Acute on chronic diastolic CHF (congestive heart failure)   Hyponatremia   COPD (chronic obstructive pulmonary disease)   Rheumatoid arthritis   Atrial fibrillation  Resolved Problems:   * No resolved hospital problems. [facility] Course: Sarah Lewis was admitted to the hospital with the working diagnosis of atrial fibrillation with RVR.   87 yo female with the past medical history of atrial fibrillation, heart failure, RA, ILD and breast cancer who presented with dyspnea, fatigue and palpitations. Recent hospitalization for heart failure and atrial fibrillation 03/16 to 03/06/23, requiring direct current cardioversion. Apparently patient doing well at home until 2 to 3 days ago when she developed palpitations and dyspnea. She was evaluated be cardiology one day prior to this admission, recommended transfer to the hospital but she declined hospitalization, then her metoprolol dose was increased. Because persistent symptoms she came to the ED. On her initial physical examination her blood pressure was 140/96, HR 130, RR 27 and 02 saturation 87%, patient was placed on supplemental 02, diltiazem infusion and received 40 mg IV furosemide, prior to transport to [facility] from [facility].  At the time of her transfer her HR 80, blood  pressure 125/88, and 02 saturation 98% with RR 22. Lungs with no wheezing or rales, heart with S1 and S2 present irregularly irregular with no gallops, abdomen with no distention and trace lower extremity edema.   Na 130, K 53, CL 98 bicarbonate 22, glucose 156, bun 33 cr 1,12 BNP 1,086  Wbc 10,0 hgb 15,7 plt 394  Sars covid 19 negative   Chest radiograph with hyperinflation, cephalization of the vasculature and small bilateral pleural effusions, more right than left.   EKG 131 bpm, left axis deviation, normal qtc, atrial fibrillation rhythm, with no significant ST segment or T wave changes.   Patient was placed on diltiazem infusion for rate control.  Diuresis with furosemide.  Patient was placed on Dofetilide for rhythm control.   04/05 plan to continue telemetry monitoring, if persistent a fib plan for direct current cardioversion.  04/06 patient converted to sinus rhythm, diltiazem was discontinued.  04/07 continue sinus rhythm.  04/08 patient will have Loop/ Alleviate HF today and then discharge home with close follow up as outpatient.   Assessment and Plan: * Atrial fibrillation with RVR 03/19 direct current cardioversion.  Patient initially placed on diltiazem infusion, and continued on oral metoprolol.  Considering history of ILD, patient not candidate for amiodarone, and decision was made to start patient on Dofetilide.  Patient converted to sinus rhythm, and remained on telemetry monitoring for close observation.   Patient tolerated antiarrhythmic therapy well, plan to continue dofetilide and have close follow up as outpatient.  Loop recorder implanted prior to her discharge.   Continue anticoagulation with apixaban.   Discharge EKG with 68 bpm, left axis  deviation, qtc 444, sinus rhythm with poor R R wave progression with no significant ST segment or T wave changes.   Acute on chronic diastolic CHF (congestive heart failure) Echocardiogram with preserved LV systolic  function EF 55 to 60%, no LVH, RV with normal systolic function, RVSP 41.2, LA and RA with severe dilatation, no pericardial effusion, moderate TR, moderate aortic valve regurgitation.  Patient was placed on furosemide with improvement on her volume status.  Continue empagliflozin and metoprolol.  Limited pharmacologic options due to risk of hypotension,  Acute hypoxemic respiratory failure due to cardiogenic pulmonary edema with bilateral pleural effusions.  Respiratory failure has resolved.  Hyponatremia CKD stage 3a. Hyponatremia, hypomagnesemia and hyperkalemia  Patient had close follow up of renal function and electrolytes.  At the time of her discharge her serum cr is 0,89, K is 4,3 and serum bicarbonate at 26.  Na 131 and Mg 2.3   Plan to follow up renal function as outpatient.  Continue SGLT 2 inh.   COPD (chronic obstructive pulmonary disease) No clinical signs of exacerbation, continue oxymetry monitoring.   Rheumatoid arthritis No active flare. PT and Ot.          Consultants: cardiology  Procedures performed: Loop recorder implantation   Disposition: Home Diet recommendation:  Cardiac diet DISCHARGE MEDICATION: Allergies as of 03/25/2023       Reactions   Amlodipine Benzoate Shortness Of Breath   Losartan Potassium Shortness Of Breath        Medication List     STOP taking these medications    metoprolol tartrate 100 MG tablet Commonly known as: LOPRESSOR       TAKE these medications    albuterol (2.5 MG/3ML) 0.083% nebulizer solution Commonly known as: PROVENTIL Take 3 mLs (2.5 mg total) by nebulization every 6 (six) hours as needed for wheezing or shortness of breath. What changed: when to take this   Calcium-Vitamin D 500-400 MG-UNIT Tabs Take 1 tablet by mouth 2 (two) times daily.   dofetilide 125 MCG capsule Commonly known as: TIKOSYN Take 1 capsule (125 mcg total) by mouth 2 (two) times daily.   Eliquis 2.5 MG Tabs  tablet Generic drug: apixaban Take 1 tablet (2.5 mg total) by mouth 2 (two) times daily.   empagliflozin 10 MG Tabs tablet Commonly known as: JARDIANCE Take 1 tablet (10 mg total) by mouth daily. Start taking on: March 26, 2023   ipratropium 0.02 % nebulizer solution Commonly known as: ATROVENT TAKE 2.5 MLS BY NEBULIZATION EVERY 6 (SIX) HOURS AS NEEDED FOR WHEEZING OR SHORTNESS OF BREATH.   metoprolol succinate 100 MG 24 hr tablet Commonly known as: TOPROL-XL Take 1 tablet (100 mg total) by mouth daily. Take with or immediately following a meal. Start taking on: March 26, 2023   Prolia 60 MG/ML Sosy injection Generic drug: denosumab Inject 60 mg into the skin every 6 (six) months.   Vitamin D-3 25 MCG (1000 UT) Caps Take 1 capsule by mouth daily.        Follow-up Information     [name], MD Follow up.   Specialty: Family Medicine Contact information: [address] [place] [address] [phone]                Discharge Exam: Filed Weights   03/24/23 0513 03/25/23 0039 03/25/23 0500  Weight: 40.2 kg 40.7 kg 40.7 kg   BP 136/73   Pulse 73   Temp 97.7 F (36.5 C) (Oral)   Resp  18   Ht 4\' 11"  (1.499 m)   Wt 40.7 kg   SpO2 92%   BMI 18.12 kg/m   Patient is feeling well, no chest pain or dyspnea, no palpitations or dizziness  Neurology awake and alert ENT with mild pallor Cardiovascular with S1 and S2 present and rhythmic with no gallops, rubs or murmurs No JVD No lower extremity edema Respiratory with no rales or wheezing Abdomen with no distention   Condition at discharge: stable  The results of significant diagnostics from this hospitalization (including imaging, microbiology, ancillary and laboratory) are listed below for reference.   Imaging Studies: DG Chest 2 View  Result Date: 03/20/2023 CLINICAL DATA:  Shortness of breath for the past week. EXAM: CHEST - 2 VIEW COMPARISON:  CT chest and chest x-ray dated March 02, 2023. FINDINGS: Unchanged cardiomegaly and small right greater than left pleural effusions. Normal pulmonary vascularity. Similar bibasilar scarring. No focal consolidation or pneumothorax. No acute osseous abnormality. IMPRESSION: 1. Unchanged small right greater than left pleural effusions. Electronically Signed   By: Obie DredgeWilliam T Derry M.D.   On: 03/20/2023 15:22   ECHO TEE  Result Date: 03/05/2023    TRANSESOPHOGEAL ECHO REPORT   Patient Name:   Sarah Lewis Date of Exam: 03/05/2023 Medical Rec #:  [id number]        Height:       60.0 in Accession #:    [id number]       Weight:       90.2 lb Date of Birth:  [birth year]       BSA:          1.330 m Patient Age:    86 years         BP:           97/53 mmHg Patient Gender: F                HR:           101 bpm. Exam Location:  Inpatient Procedure: Cardiac Doppler, Color Doppler and Transesophageal Echo Indications:     I48.91* Unspeicified atrial fibrillation  History:         Patient has prior history of Echocardiogram examinations, most                  recent 03/03/2023. CHF, Abnormal ECG, Arrythmias:Atrial                  Fibrillation; Signs/Symptoms:Dyspnea and Shortness of Breath.                  Breast cancer.  Sonographer:     Sheralyn Boatmanina West RDCS Referring Phys:  [id number] Jonita AlbeeKATHLEEN R JOHNSON Diagnosing Phys: Zoila ShutterKenneth Hilty MD PROCEDURE: After discussion of the risks and benefits of a TEE, an informed consent was obtained from the patient. The transesophogeal probe was passed without difficulty through the esophogus of the patient. Sedation performed by different physician. The patient was monitored while under deep sedation. Anesthestetic sedation was provided intravenously by Anesthesiology: 75mg  of Propofol, 40mg  of Lidocaine. The patient developed Respiratory depression during the procedure. A successful direct current cardioversion was performed at 150 joules with 1 attempt. Transgastric views not obtained.  IMPRESSIONS  1. Left ventricular ejection  fraction, by estimation, is 60 to 65%. The left ventricle has normal function.  2. Right ventricular systolic function is normal. The right ventricular size is normal.  3. Left atrial size was severely dilated. No left atrial/left atrial appendage thrombus was  detected.  4. Right atrial size was severely dilated.  5. The mitral valve is abnormal. Mild mitral valve regurgitation.  6. The tricuspid valve is abnormal. Tricuspid valve regurgitation is moderate.  7. The aortic valve is tricuspid. Aortic valve regurgitation is mild. Aortic valve sclerosis is present, with no evidence of aortic valve stenosis. Conclusion(s)/Recommendation(s): No LA/LAA thrombus identified. Successful cardioversion performed with restoration of normal sinus rhythm. FINDINGS  Left Ventricle: Left ventricular ejection fraction, by estimation, is 60 to 65%. The left ventricle has normal function. The left ventricular internal cavity size was normal in size. There is no left ventricular hypertrophy. Right Ventricle: The right ventricular size is normal. No increase in right ventricular wall thickness. Right ventricular systolic function is normal. Left Atrium: Left atrial size was severely dilated. No left atrial/left atrial appendage thrombus was detected. Right Atrium: Right atrial size was severely dilated. Pericardium: There is no evidence of pericardial effusion. Mitral Valve: The mitral valve is abnormal. Mild mitral valve regurgitation. Tricuspid Valve: The tricuspid valve is abnormal. Tricuspid valve regurgitation is moderate. Aortic Valve: The aortic valve is tricuspid. Aortic valve regurgitation is mild. Aortic valve sclerosis is present, with no evidence of aortic valve stenosis. Pulmonic Valve: The pulmonic valve was grossly normal. Pulmonic valve regurgitation is trivial. Aorta: The aortic root and ascending aorta are structurally normal, with no evidence of dilitation. There is minimal (Grade I) plaque involving the aortic arch  and descending aorta. IAS/Shunts: No atrial level shunt detected by color flow Doppler. Additional Comments: Spectral Doppler performed. Zoila Shutter MD Electronically signed by Zoila Shutter MD Signature Date/Time: 03/05/2023/2:21:19 PM    Final    ECHOCARDIOGRAM COMPLETE  Result Date: 03/04/2023    ECHOCARDIOGRAM REPORT   Patient Name:   Sarah Lewis Date of Exam: 03/03/2023 Medical Rec #:  [id number]        Height:       60.0 in Accession #:    [id number]       Weight:       94.0 lb Date of Birth:  June 15, 1936       BSA:          1.354 m Patient Age:    86 years         BP:           109/62 mmHg Patient Gender: F                HR:           92 bpm. Exam Location:  Inpatient Procedure: 2D Echo, Cardiac Doppler and Color Doppler Indications:    Atrial Fibrillation I48.91  History:        Patient has prior history of Echocardiogram examinations, most                 recent 04/30/2022. CHF, Aortic Valve Disease, Arrythmias:Atrial                 Fibrillation; Risk Factors:Hypertension. Acute heart failure                 with preserved ejection fraction. Malignant neoplasm of                 lower-outer quadrant of right female breast.  Sonographer:    Celesta Gentile RCS Referring Phys: [id number] SARA-MAIZ A THOMAS IMPRESSIONS  1. Left ventricular ejection fraction, by estimation, is 55 to 60%. The left ventricle has normal function. The left ventricle has no regional wall motion abnormalities. Left  ventricular diastolic parameters are indeterminate.  2. Right ventricular systolic function is normal. The right ventricular size is normal. There is mildly elevated pulmonary artery systolic pressure. The estimated right ventricular systolic pressure is 41.2 mmHg.  3. Left atrial size was severely dilated.  4. Right atrial size was severely dilated.  5. Mild mitral valve regurgitation.  6. Tricuspid valve regurgitation is moderate.  7. The aortic valve was not well visualized. There is moderate calcification of the  aortic valve. Aortic valve regurgitation is moderate.  8. The inferior vena cava is normal in size with <50% respiratory variability, suggesting right atrial pressure of 8 mmHg. FINDINGS  Left Ventricle: Left ventricular ejection fraction, by estimation, is 55 to 60%. The left ventricle has normal function. The left ventricle has no regional wall motion abnormalities. The left ventricular internal cavity size was normal in size. There is  no left ventricular hypertrophy. Left ventricular diastolic parameters are indeterminate. Right Ventricle: The right ventricular size is normal. Right ventricular systolic function is normal. There is mildly elevated pulmonary artery systolic pressure. The tricuspid regurgitant velocity is 2.88 m/s, and with an assumed right atrial pressure of 8 mmHg, the estimated right ventricular systolic pressure is 41.2 mmHg. Left Atrium: Left atrial size was severely dilated. Right Atrium: Right atrial size was severely dilated. Pericardium: There is no evidence of pericardial effusion. Mitral Valve: Mild mitral valve regurgitation. Tricuspid Valve: Tricuspid valve regurgitation is moderate. Aortic Valve: The aortic valve was not well visualized. There is moderate calcification of the aortic valve. Aortic valve regurgitation is moderate. Aortic regurgitation PHT measures 492 msec. Pulmonic Valve: Pulmonic valve regurgitation is not visualized. Aorta: The aortic root and ascending aorta are structurally normal, with no evidence of dilitation. Venous: The inferior vena cava is normal in size with less than 50% respiratory variability, suggesting right atrial pressure of 8 mmHg. IAS/Shunts: No atrial level shunt detected by color flow Doppler.  LEFT VENTRICLE PLAX 2D LVIDd:         3.50 cm LVIDs:         2.50 cm LV PW:         0.90 cm LV IVS:        0.90 cm LVOT diam:     1.50 cm LV SV:         32 LV SV Index:   24 LVOT Area:     1.77 cm  RIGHT VENTRICLE TAPSE (M-mode): 1.8 cm LEFT ATRIUM               Index        RIGHT ATRIUM           Index LA diam:        3.80 cm  2.81 cm/m   RA Area:     23.00 cm LA Vol (A2C):   74.5 ml  55.01 ml/m  RA Volume:   73.45 ml  54.24 ml/m LA Vol (A4C):   101.0 ml 74.58 ml/m LA Biplane Vol: 86.8 ml  64.10 ml/m  AORTIC VALVE LVOT Vmax:   89.40 cm/s LVOT Vmean:  60.900 cm/s LVOT VTI:    0.182 m AI PHT:      492 msec  AORTA Ao Root diam: 2.90 cm MITRAL VALVE                TRICUSPID VALVE MV Area (PHT): 4.68 cm     TR Peak grad:   33.2 mmHg MV Decel Time: 162 msec     TR Vmax:  288.00 cm/s MV E velocity: 129.00 cm/s                             SHUNTS                             Systemic VTI:  0.18 m                             Systemic Diam: 1.50 cm Carolan Clines Electronically signed by Carolan Clines Signature Date/Time: 03/04/2023/8:43:14 AM    Final    CT Angio Chest PE W and/or Wo Contrast  Result Date: 03/02/2023 CLINICAL DATA:  Shortness of breath, tachypnea, tachycardia, concern for pulmonary embolism. EXAM: CT ANGIOGRAPHY CHEST WITH CONTRAST TECHNIQUE: Multidetector CT imaging of the chest was performed using the standard protocol during bolus administration of intravenous contrast. Multiplanar CT image reconstructions and MIPs were obtained to evaluate the vascular anatomy. RADIATION DOSE REDUCTION: This exam was performed according to the departmental dose-optimization program which includes automated exposure control, adjustment of the mA and/or kV according to patient size and/or use of iterative reconstruction technique. CONTRAST:  80mL OMNIPAQUE IOHEXOL 350 MG/ML SOLN COMPARISON:  Same day chest radiograph and CT chest dated 01/23/2019. FINDINGS: Cardiovascular: Satisfactory opacification of the pulmonary arteries to the segmental level. No evidence of pulmonary embolism. The right and left pulmonary arteries are enlarged, measuring 2.6 cm and 2.5 cm respectively, suggestive of pulmonary hypertension. Vascular calcifications are seen in the coronary  arteries and aortic arch. The heart is enlarged. No pericardial effusion. Mediastinum/Nodes: No enlarged mediastinal, hilar, or axillary lymph nodes. Thyroid gland, trachea, and esophagus demonstrate no significant findings. Bilateral breast implants are noted. Lungs/Pleura: There is a moderate right and small left pleural effusion with associated atelectasis. There is mild atelectasis/scarring in the right middle lobe and the lingula. There is no pneumothorax. Upper Abdomen: No acute abnormality. Musculoskeletal: No chest wall abnormality. No acute or significant osseous findings. Review of the MIP images confirms the above findings. IMPRESSION: 1. No evidence of pulmonary embolism. 2. Moderate right and small left pleural effusions with associated atelectasis. 3. Cardiomegaly and findings suggestive of pulmonary hypertension. Aortic Atherosclerosis (ICD10-I70.0). Electronically Signed   By: Romona Curls M.D.   On: 03/02/2023 21:41   DG Chest Portable 1 View  Result Date: 03/02/2023 CLINICAL DATA:  Cough, shortness of breath EXAM: PORTABLE CHEST 1 VIEW COMPARISON:  10/12/2015 FINDINGS: There is hyperinflation of the lungs compatible with COPD. Cardiomegaly, aortic atherosclerosis. Airspace opacity in the right lung base with small right pleural effusion. No confluent opacity on the left. Biapical scarring. No acute bony abnormality. IMPRESSION: COPD/chronic changes. Cardiomegaly. Right lower lobe airspace opacity with small right effusion. This could reflect pneumonia. Electronically Signed   By: Charlett Nose M.D.   On: 03/02/2023 20:10    Microbiology: Results for orders placed or performed during the hospital encounter of 03/02/23  Resp panel by RT-PCR (RSV, Flu A&B, Covid) Anterior Nasal Swab     Status: None   Collection Time: 03/02/23  7:57 PM   Specimen: Anterior Nasal Swab  Result Value Ref Range Status   SARS Coronavirus 2 by RT PCR NEGATIVE NEGATIVE Final    Comment: (NOTE) SARS-CoV-2 target  nucleic acids are NOT DETECTED.  The SARS-CoV-2 RNA is generally detectable in upper respiratory specimens during the acute phase of infection. The  lowest concentration of SARS-CoV-2 viral copies this assay can detect is 138 copies/mL. A negative result does not preclude SARS-Cov-2 infection and should not be used as the sole basis for treatment or other patient management decisions. A negative result may occur with  improper specimen collection/handling, submission of specimen other than nasopharyngeal swab, presence of viral mutation(s) within the areas targeted by this assay, and inadequate number of viral copies(<138 copies/mL). A negative result must be combined with clinical observations, patient history, and epidemiological information. The expected result is Negative.  Fact Sheet for Patients:  BloggerCourse.com  Fact Sheet for Healthcare Providers:  SeriousBroker.it  This test is no t yet approved or cleared by the [place] FDA and  has been authorized for detection and/or diagnosis of SARS-CoV-2 by FDA under an Emergency Use Authorization (EUA). This EUA will remain  in effect (meaning this test can be used) for the duration of the COVID-19 declaration under Section 564(b)(1) of the Act, 21 U.S.C.section 360bbb-3(b)(1), unless the authorization is terminated  or revoked sooner.       Influenza A by PCR NEGATIVE NEGATIVE Final   Influenza B by PCR NEGATIVE NEGATIVE Final    Comment: (NOTE) The Xpert Xpress SARS-CoV-2/FLU/RSV plus assay is intended as an aid in the diagnosis of influenza from Nasopharyngeal swab specimens and should not be used as a sole basis for treatment. Nasal washings and aspirates are unacceptable for Xpert Xpress SARS-CoV-2/FLU/RSV testing.  Fact Sheet for Patients: BloggerCourse.com  Fact Sheet for Healthcare  Providers: SeriousBroker.it  This test is not yet approved or cleared by the [place] FDA and has been authorized for detection and/or diagnosis of SARS-CoV-2 by FDA under an Emergency Use Authorization (EUA). This EUA will remain in effect (meaning this test can be used) for the duration of the COVID-19 declaration under Section 564(b)(1) of the Act, 21 U.S.C. section 360bbb-3(b)(1), unless the authorization is terminated or revoked.     Resp Syncytial Virus by PCR NEGATIVE NEGATIVE Final    Comment: (NOTE) Fact Sheet for Patients: BloggerCourse.com  Fact Sheet for Healthcare Providers: SeriousBroker.it  This test is not yet approved or cleared by the [place] FDA and has been authorized for detection and/or diagnosis of SARS-CoV-2 by FDA under an Emergency Use Authorization (EUA). This EUA will remain in effect (meaning this test can be used) for the duration of the COVID-19 declaration under Section 564(b)(1) of the Act, 21 U.S.C. section 360bbb-3(b)(1), unless the authorization is terminated or revoked.  Performed at [facility], [address], [place], [place] [address]     Labs: CBC: Recent Labs  Lab 03/20/23 1453 03/21/23 0035 03/22/23 0038 03/23/23 0033  WBC 10.0 10.3 11.1* 9.5  HGB 15.7* 13.4 13.8 14.0  HCT 46.2* 38.8 39.9 40.5  MCV 90.9 90.4 90.9 91.4  PLT 394 281 267 280   Basic Metabolic Panel: Recent Labs  Lab 03/21/23 0035 03/22/23 0038 03/22/23 1354 03/23/23 0033 03/24/23 0038 03/25/23 0047  NA 131* 130* 134* 134* 130* 131*  K 3.9 3.7 4.1 4.0 4.0 4.3  CL 97* 96* 98 99 99 99  CO2 27 26 28 26 27 26   GLUCOSE 135* 109* 127* 104* 89 84  BUN 27* 27* 23 24* 19 19  CREATININE 1.12* 1.19* 1.06* 0.96 0.89 0.89  CALCIUM 8.4* 8.0* 7.9* 7.8* 7.4* 8.2*  MG 1.6* 2.3  --  2.2 2.0 2.3   Liver Function Tests: No results for input(s): "AST", "ALT",  "ALKPHOS", "BILITOT", "PROT", "ALBUMIN" in the  last 168 hours. CBG: No results for input(s): "GLUCAP" in the last 168 hours.  Discharge time spent: greater than 30 minutes.  Signed: Coralie Keens, MD [facility] 03/25/2023

## 2023-03-25 NOTE — Care Management Important Message (Signed)
Important Message  Patient Details  Name: Sarah Lewis MRN: [id number] Date of Birth: [birth year]   Medicare Important Message Given:  Yes     Renie Ora 03/25/2023, 9:15 AM

## 2023-03-25 NOTE — Progress Notes (Addendum)
Pharmacy: Dofetilide (Tikosyn) - Follow Up Assessment and Electrolyte Replacement  Pharmacy consulted to assist in monitoring and replacing electrolytes in this 87 y.o. female admitted on 03/20/2023 undergoing dofetilide initiation. First dofetilide dose: 03/21/22  Labs:    Component Value Date/Time   K 4.3 03/25/2023 0047   K 4.3 05/01/2017 1341   MG 2.3 03/25/2023 0047     Plan: Potassium: K >/= 4: No additional supplementation needed  Magnesium: Mg > 2: No additional supplementation needed  As patient has required on average 10 mEq of potassium replacement every day, recommend discharging patient with prescription for:  Potassium chloride 10 mEq daily if want supplementation at discharge (if plan for diuretic) however could discharge without supplementation otherwise  Thank you for allowing pharmacy to participate in this patient's care,  Sherron Monday, PharmD, BCCCP Clinical Pharmacist  Phone: [phone] 03/25/2023 7:41 AM  Please check AMION for all [facility] phone numbers After 10:00 PM, call Main Pharmacy [phone]

## 2023-03-25 NOTE — Plan of Care (Signed)
Problem: Education: Goal: Knowledge of General Education information will improve Description: Including pain rating scale, medication(s)/side effects and non-pharmacologic comfort measures 03/25/2023 1131 by Louanne Belton, RN Outcome: Progressing 03/25/2023 1131 by Louanne Belton, RN Outcome: Progressing   Problem: Health Behavior/Discharge Planning: Goal: Ability to manage health-related needs will improve 03/25/2023 1131 by Louanne Belton, RN Outcome: Progressing 03/25/2023 1131 by Louanne Belton, RN Outcome: Progressing   Problem: Clinical Measurements: Goal: Ability to maintain clinical measurements within normal limits will improve 03/25/2023 1131 by Louanne Belton, RN Outcome: Progressing 03/25/2023 1131 by Louanne Belton, RN Outcome: Progressing Goal: Will remain free from infection 03/25/2023 1131 by Louanne Belton, RN Outcome: Progressing 03/25/2023 1131 by Louanne Belton, RN Outcome: Progressing Goal: Diagnostic test results will improve 03/25/2023 1131 by Louanne Belton, RN Outcome: Progressing 03/25/2023 1131 by Louanne Belton, RN Outcome: Progressing Goal: Respiratory complications will improve 03/25/2023 1131 by Louanne Belton, RN Outcome: Progressing 03/25/2023 1131 by Louanne Belton, RN Outcome: Progressing Goal: Cardiovascular complication will be avoided 03/25/2023 1131 by Louanne Belton, RN Outcome: Progressing 03/25/2023 1131 by Louanne Belton, RN Outcome: Progressing   Problem: Activity: Goal: Risk for activity intolerance will decrease 03/25/2023 1131 by Louanne Belton, RN Outcome: Progressing 03/25/2023 1131 by Louanne Belton, RN Outcome: Progressing   Problem: Nutrition: Goal: Adequate nutrition will be maintained 03/25/2023 1131 by Louanne Belton, RN Outcome: Progressing 03/25/2023 1131 by Louanne Belton, RN Outcome: Progressing   Problem: Coping: Goal: Level of anxiety will decrease 03/25/2023 1131 by Louanne Belton, RN Outcome:  Progressing 03/25/2023 1131 by Louanne Belton, RN Outcome: Progressing   Problem: Elimination: Goal: Will not experience complications related to bowel motility 03/25/2023 1131 by Louanne Belton, RN Outcome: Progressing 03/25/2023 1131 by Louanne Belton, RN Outcome: Progressing Goal: Will not experience complications related to urinary retention 03/25/2023 1131 by Louanne Belton, RN Outcome: Progressing 03/25/2023 1131 by Louanne Belton, RN Outcome: Progressing   Problem: Pain Managment: Goal: General experience of comfort will improve 03/25/2023 1131 by Louanne Belton, RN Outcome: Progressing 03/25/2023 1131 by Louanne Belton, RN Outcome: Progressing   Problem: Safety: Goal: Ability to remain free from injury will improve 03/25/2023 1131 by Louanne Belton, RN Outcome: Progressing 03/25/2023 1131 by Louanne Belton, RN Outcome: Progressing   Problem: Skin Integrity: Goal: Risk for impaired skin integrity will decrease 03/25/2023 1131 by Louanne Belton, RN Outcome: Progressing 03/25/2023 1131 by Louanne Belton, RN Outcome: Progressing   Problem: Education: Goal: Knowledge of disease or condition will improve 03/25/2023 1131 by Louanne Belton, RN Outcome: Progressing 03/25/2023 1131 by Louanne Belton, RN Outcome: Progressing Goal: Understanding of medication regimen will improve 03/25/2023 1131 by Louanne Belton, RN Outcome: Progressing 03/25/2023 1131 by Louanne Belton, RN Outcome: Progressing Goal: Individualized Educational Video(s) 03/25/2023 1131 by Louanne Belton, RN Outcome: Progressing 03/25/2023 1131 by Louanne Belton, RN Outcome: Progressing   Problem: Activity: Goal: Ability to tolerate increased activity will improve 03/25/2023 1131 by Louanne Belton, RN Outcome: Progressing 03/25/2023 1131 by Louanne Belton, RN Outcome: Progressing   Problem: Cardiac: Goal: Ability to achieve and maintain adequate cardiopulmonary perfusion will improve 03/25/2023 1131 by Louanne Belton, RN Outcome: Progressing 03/25/2023 1131 by Louanne Belton, RN Outcome: Progressing   Problem: Health Behavior/Discharge Planning: Goal: Ability to safely manage health-related needs after discharge will improve 03/25/2023 1131 by Louanne Belton,  RN Outcome: Progressing 03/25/2023 1131 by Louanne Beltonherry, Babatunde Seago P, RN Outcome: Progressing   Problem: Education: Goal: Ability to demonstrate management of disease process will improve 03/25/2023 1131 by Louanne Beltonherry, Gianni Mihalik P, RN Outcome: Progressing 03/25/2023 1131 by Louanne Beltonherry, Arcangel Minion P, RN Outcome: Progressing Goal: Ability to verbalize understanding of medication therapies will improve 03/25/2023 1131 by Louanne Beltonherry, Constantine Ruddick P, RN Outcome: Progressing 03/25/2023 1131 by Louanne Beltonherry, Shaleigh Laubscher P, RN Outcome: Progressing Goal: Individualized Educational Video(s) 03/25/2023 1131 by Louanne Beltonherry, Jette Lewan P, RN Outcome: Progressing 03/25/2023 1131 by Louanne Beltonherry, Chasyn Cinque P, RN Outcome: Progressing   Problem: Activity: Goal: Capacity to carry out activities will improve 03/25/2023 1131 by Louanne Beltonherry, Kateri Balch P, RN Outcome: Progressing 03/25/2023 1131 by Louanne Beltonherry, Kortney Schoenfelder P, RN Outcome: Progressing   Problem: Cardiac: Goal: Ability to achieve and maintain adequate cardiopulmonary perfusion will improve 03/25/2023 1131 by Louanne Beltonherry, Laretta Pyatt P, RN Outcome: Progressing 03/25/2023 1131 by Louanne Beltonherry, Drew Herman P, RN Outcome: Progressing

## 2023-03-25 NOTE — Research (Addendum)
Alleviate HF Informed Consent   Subject Name: Sarah Lewis  Subject met inclusion and exclusion criteria.  The informed consent form, study requirements and expectations were reviewed with the subject and questions and concerns were addressed prior to the signing of the consent form.  The subject verbalized understanding of the trial requirements.  The subject agreed to participate in the Alleviate HF trial and signed the informed consent at 0830 on 03/25/2023.  The informed consent was obtained prior to performance of any protocol-specific procedures for the subject.  A copy of the signed informed consent was given to the subject and a copy was placed in the subject's medical record.   Tametha Banning    Reviewed of history and physical by investigator.   INCLUSION [x]  []  Patient has NYHA Class II or III heart failure per most recent assessment, irrespective of left ventricular ejection fraction (LVEF)  [x]  []  Patient has documented recent history of symptomatic heart failure, defined as meeting any one of the following three criteria: 1. Hospital admission with primary diagnosis of HF within the last 12 months, OR 2. Intravenous HF therapy (e.g. IV diuretics/vasodilators) or ultrafiltration within the last 6 months, OR 3. Patient had the following BNP/NT-proBNP6 within the last 3 months: - If LVEF ? 50%, then BNP> 150 pg/ml or NT-proBNP > 450 pg/ml OR - If LVEF is <50%, then BNP> 300 pg/ml or NT-proBNP > 900 pg/ml  [x]  []  Patient is willing and able to comply with the protocol, including LINQ ICM insertion, [facility] transmissions (including adequate connectivity), study visits and remote care directions  [x]  []  Patient is 85 years of age or older  [x]  []  Patient has a life expectancy of 12 months or more    EXCLUSION []  [x]  Patient is currently implanted with a cardiovascular implantable electronic device (CIED)7 (e.g. ICM8, pacemaker, ICD, CRT-D or CRT-P device) or hemodynamic monitor    []  [x]  Patient is receiving temporary or permanent mechanical circulatory support   []  [x]  Patient had MI or PCI/CABG within past 90 days   []  [x]  Patient has had a heart transplant, or is currently on heart transplant list   []  [x]  Patient has severe valve stenosis on echocardiogram   []  [x]  Patient has primary pulmonary hypertension (pre-capillary, WHO group 1,3,4,5)   []  [x]  Patient is on chronic intravenous inotropic drug therapy (e.g. dobutamine, milrinone)   []  [x]  Patient has severe renal impairment (eGFR <30 mL/min)   []  [x]  Patient has systolic blood pressure of < 90 mmHg at the time of enrollment   []  [x]  Patient is on chronic renal dialysis   []  [x]  Patient is unable to undergo one round of PRN medication intervention (i.e. 4 days of increased diuretics dose), based on the judgement of the investigator (e.g. known history of side effects or intolerance to PRN dosing of diuretics)   []  [x]  Patient has liver disease, defined as AST/ALT >5x normal, or bilirubin >2x normal   []  [x]  Patient has serum albumin < 3 g/dL   []  [x]  Patient has hypertrophic obstructive cardiomyopathy, constrictive pericarditis or amyloidosis   []  [x]   Patient has complex adult congenital heart disease   []  [x]  Patient has active cancer involving chemotherapy and/or radiation therapy   []  [x]  Patient weighs more than 500 pounds  []  [x]  Patient is pregnant (all females of child-bearing potential must have a negative pregnancy test within 1 week of enrollment)    Linq implanted  EQ 5D 5L completed KCCQ completed  NYHA ll 6 MHWT done

## 2023-03-26 ENCOUNTER — Other Ambulatory Visit: Payer: Self-pay | Admitting: Cardiology

## 2023-03-26 MED ORDER — POTASSIUM CHLORIDE ER 10 MEQ PO TBCR
EXTENDED_RELEASE_TABLET | ORAL | 3 refills | Status: DC
Start: 2023-03-25 — End: 2023-04-03

## 2023-03-26 MED ORDER — FUROSEMIDE 20 MG PO TABS
ORAL_TABLET | ORAL | 3 refills | Status: DC
Start: 2023-03-25 — End: 2023-04-03

## 2023-03-26 NOTE — Addendum Note (Signed)
Addended by: Dutch Quint B on: 03/26/2023 01:08 PM   Modules accepted: Orders

## 2023-03-28 ENCOUNTER — Telehealth: Payer: Self-pay

## 2023-03-28 ENCOUNTER — Ambulatory Visit (HOSPITAL_COMMUNITY): Payer: Medicare PPO | Admitting: Physician Assistant

## 2023-03-28 NOTE — Telephone Encounter (Signed)
-----   Message from Graciella Freer, [place] sent at 03/26/2023 12:27 PM EDT ----- Regarding: Same Day Discharge LOOP 03/25/23 Dr. Lalla Brothers   Pt in Alleviate study for loop

## 2023-03-28 NOTE — Telephone Encounter (Signed)
  Loop Recorder Follow up   Is patient connected to [employer]? Yes - transmissions monitored by research for Alleviate study   Have steri-strips fallen off or been removed? No-Pt states she will remove outer bandage today.  Advised under the outer bandage are steristrips that should fall off on their own.    Does the patient need in office follow up? Yes-follow up scheduled  Please continue to monitor your cardiac device site for redness, swelling, and drainage. Call the device clinic at [phone] if you experience these symptoms, fever/chills, or have questions about your device.

## 2023-03-29 ENCOUNTER — Ambulatory Visit (HOSPITAL_COMMUNITY): Payer: Medicare PPO

## 2023-04-01 ENCOUNTER — Ambulatory Visit: Payer: Medicare PPO | Admitting: Pulmonary Disease

## 2023-04-01 ENCOUNTER — Encounter: Payer: Self-pay | Admitting: Pulmonary Disease

## 2023-04-01 VITALS — BP 140/120 | HR 123 | Ht 59.0 in | Wt 94.8 lb

## 2023-04-01 DIAGNOSIS — Z1331 Encounter for screening for depression: Secondary | ICD-10-CM | POA: Diagnosis not present

## 2023-04-01 DIAGNOSIS — Z09 Encounter for follow-up examination after completed treatment for conditions other than malignant neoplasm: Secondary | ICD-10-CM | POA: Diagnosis not present

## 2023-04-01 DIAGNOSIS — Z0000 Encounter for general adult medical examination without abnormal findings: Secondary | ICD-10-CM | POA: Diagnosis not present

## 2023-04-01 DIAGNOSIS — I4891 Unspecified atrial fibrillation: Secondary | ICD-10-CM

## 2023-04-01 DIAGNOSIS — I5033 Acute on chronic diastolic (congestive) heart failure: Secondary | ICD-10-CM | POA: Diagnosis not present

## 2023-04-01 DIAGNOSIS — J479 Bronchiectasis, uncomplicated: Secondary | ICD-10-CM | POA: Diagnosis not present

## 2023-04-01 DIAGNOSIS — I10 Essential (primary) hypertension: Secondary | ICD-10-CM | POA: Diagnosis not present

## 2023-04-01 DIAGNOSIS — I110 Hypertensive heart disease with heart failure: Secondary | ICD-10-CM | POA: Diagnosis not present

## 2023-04-01 DIAGNOSIS — J449 Chronic obstructive pulmonary disease, unspecified: Secondary | ICD-10-CM | POA: Diagnosis not present

## 2023-04-01 DIAGNOSIS — D6869 Other thrombophilia: Secondary | ICD-10-CM | POA: Diagnosis not present

## 2023-04-01 DIAGNOSIS — E46 Unspecified protein-calorie malnutrition: Secondary | ICD-10-CM | POA: Diagnosis not present

## 2023-04-01 NOTE — Patient Instructions (Signed)
Thank you for visiting Dr. Tonia Brooms at [facility]. Today we recommend the following:  Continue nebs and flutter valve   Return in about 1 year (around 03/31/2024) for with APP or Dr. Tonia Brooms.    Please do your part to reduce the spread of COVID-19.

## 2023-04-01 NOTE — Progress Notes (Signed)
Synopsis: Referred in Jan 2020 for SOB by [name], MD  Subjective:   PATIENT ID: Sarah Lewis GENDER: female DOB: 28-Feb-1936, MRN: [id number]  Chief Complaint  Patient presents with   Follow-up    F/up    PMH of HTN, on medications, h/o breast cancer s/p BL mastectomy. SOB for the past 5-6 months, most notice when walking up steps or up the hill. She feels like she has to stop. Nothing has made it better or worse. She feels all right if she stops to catch her breath. Lives here in [place], 50+ years. 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 She does have a cough. Nothing comes up with it. Dry non-productive cough. She really had a bad cough when she was on lisinopril. Much better after coming off this. Life long non-smoker. No alcohol use. Married, 2 children, 5 grandkids.   OV 09/19/2020: Here today for follow-up.  Still having ongoing dyspnea on exertion on occasion.  Does have dry nonproductive cough.  She does have mild areas of scarring within the lung on her recent CT scan in 2020.  She had work-up to include pulmonary function test which did really show areas of restriction with impaired spirometry although be it mild.  She does have small areas of air trapping.  Review of the images myself do show some areas of bronchiectasis the tree-in-bud nodules which may in fact reflect MAI.  We discussed this today in the office.  Her productive cough is still nonproductive and her weight has been stable.  She is a little concerned about having blood clots.  But she has no risk factors for these no recent surgeries no trauma no swelling in the legs no chest pain.  She was worried because she had a friend recently diagnosed with these and is wondering if this could still be her problem.  OV 10/30/2021: Here for follow-up.  Was initially seen for ongoing dyspnea dry nonproductive cough.  She has some areas of mild bronchiectasis.  She was started on a trial of  nebulizers.  She is only been using the ipratropium nasal nebulizer portion and has been doing really well with this.  She feels like her symptoms have totally been erased.  No other significant shortness of breath issues.  She uses it first thing in the morning.  Her coughing spells have almost completely dissipated.  She would like to continue and stay on this if at all possible.  OV 04/01/2023: Here today for follow-up.  From a respiratory standpoint she is doing well.  Minimal cough.  Mild bronchiectasis using her nebulized treatments.  From a respiratory standpoint doing pretty well.  Here today for routine follow-up.  Of note has had issues with heart rate.  She is in A-fib today in the office.  Tachycardic.  I communicated this with her cardiology team.  She has an appointment tomorrow.      Past Medical History:  Diagnosis Date   Anemia yrs ago   Aortic insufficiency    Breast cancer of lower-outer quadrant of right female breast 10/10/2015   left breat also chemo done for first   Cough    from toporol   Heart murmur    Hypertension    Osteoporosis    Spinal headache yrs ago     Family History  Problem Relation Age of Onset   Other Mother        malignant neoplasm   Bone cancer Father  malignant neoplasm   Other Sister        malignant neoplasm     Past Surgical History:  Procedure Laterality Date   ABDOMINAL HYSTERECTOMY     total   APPENDECTOMY     BREAST RECONSTRUCTION WITH PLACEMENT OF TISSUE EXPANDER AND FLEX HD (ACELLULAR HYDRATED DERMIS) Right 12/13/2015   Procedure: RIGHT BREAST RECONSTRUCTION WITH PLACEMENT OF silicone gel implant and allograft tissue;  Surgeon: [name], MD;  Location[facility] OR;  Service: Plastics;  Laterality: Right;   BREAST SURGERY     CARDIOVERSION N/A 03/05/2023   Procedure: CARDIOVERSION;  Surgeon: [name], MD;  Location: [facility] ENDOSCOPY;  Service: Cardiovascular;  Laterality: N/A;   CESAREAN SECTION     x 2   COLONOSCOPY  WITH PROPOFOL N/A 04/08/2017   Procedure: COLONOSCOPY WITH PROPOFOL;  Surgeon: [name], MD;  Location: [facility] ENDOSCOPY;  Service: Endoscopy;  Laterality: N/A;   HERNIA REPAIR     LOOP RECORDER INSERTION N/A 03/25/2023   Procedure: LOOP RECORDER INSERTION;  Surgeon: [name], MD;  Location: [facility] INVASIVE CV LAB;  Service: Cardiovascular;  Laterality: N/A;   MASTECTOMY Left 1979   TEE WITHOUT CARDIOVERSION N/A 03/05/2023   Procedure: TRANSESOPHAGEAL ECHOCARDIOGRAM (TEE);  Surgeon: [name], MD;  Location: [facility] ENDOSCOPY;  Service: Cardiovascular;  Laterality: N/A;   TONSILLECTOMY     TOTAL MASTECTOMY Right 12/13/2015   Procedure: RIGHT MASTECTOMY ;  Surgeon: [name], MD;  Location: [facility] OR;  Service: General;  Laterality: Right;    Social History   Socioeconomic History   Marital status: Married    Spouse name: Not on file   Number of children: 2   Years of education: Not on file   Highest education level: Not on file  Occupational History   Not on file  Tobacco Use   Smoking status: Never   Smokeless tobacco: Never  Vaping Use   Vaping Use: Never used  Substance and Sexual Activity   Alcohol use: No   Drug use: No   Sexual activity: Not on file  Other Topics Concern   Not on file  Social History Narrative   Not on file   Social Determinants of Health   Financial Resource Strain: Not on file  Food Insecurity: Not on file  Transportation Needs: Not on file  Physical Activity: Not on file  Stress: Not on file  Social Connections: Not on file  Intimate Partner Violence: Not on file     Allergies  Allergen Reactions   Amlodipine Benzoate Shortness Of Breath   Losartan Potassium Shortness Of Breath     Outpatient Medications Prior to Visit  Medication Sig Dispense Refill   albuterol (PROVENTIL) (2.5 MG/3ML) 0.083% nebulizer solution Take 3 mLs (2.5 mg total) by nebulization every 6 (six) hours as needed for wheezing or shortness of breath. (Patient  taking differently: Take 2.5 mg by nebulization in the morning.) 75 mL 12   apixaban (ELIQUIS) 2.5 MG TABS tablet Take 1 tablet (2.5 mg total) by mouth 2 (two) times daily. 60 tablet 2   Calcium Carb-Cholecalciferol (CALCIUM-VITAMIN D) 500-400 MG-UNIT TABS Take 1 tablet by mouth 2 (two) times daily.     Cholecalciferol (VITAMIN D-3) 25 MCG (1000 UT) CAPS Take 1 capsule by mouth daily.     denosumab (PROLIA) 60 MG/ML SOSY injection Inject 60 mg into the skin every 6 (six) months.     dofetilide (TIKOSYN) 125 MCG capsule Take 1 capsule (125 mcg total) by  mouth 2 (two) times daily. 60 capsule 0   empagliflozin (JARDIANCE) 10 MG TABS tablet Take 1 tablet (10 mg total) by mouth daily. 30 tablet 0   furosemide (LASIX) 20 MG tablet As needed if directed patient may take 20 MG Lasix PRN by mouth daily x 4 days as directed per [employer] HF Study PRN plan. 90 tablet 3   ipratropium (ATROVENT) 0.02 % nebulizer solution TAKE 2.5 MLS BY NEBULIZATION EVERY 6 (SIX) HOURS AS NEEDED FOR WHEEZING OR SHORTNESS OF BREATH. 125 mL 6   metoprolol succinate (TOPROL-XL) 100 MG 24 hr tablet Take 1 tablet (100 mg total) by mouth daily. Take with or immediately following a meal. 30 tablet 0   potassium chloride (KLOR-CON) 10 MEQ tablet As needed if directed patient may take 10 mEq Potassium PRN by mouth daily x 4 days as directed per [employer] HF Study PRN plan. 90 tablet 3   No facility-administered medications prior to visit.    Review of Systems  Constitutional:  Negative for chills, fever, malaise/fatigue and weight loss.  HENT:  Negative for hearing loss, sore throat and tinnitus.   Eyes:  Negative for blurred vision and double vision.  Respiratory:  Positive for cough. Negative for hemoptysis, sputum production, shortness of breath, wheezing and stridor.   Cardiovascular:  Negative for chest pain, palpitations, orthopnea, leg swelling and PND.  Gastrointestinal:  Negative for abdominal pain,  constipation, diarrhea, heartburn, nausea and vomiting.  Genitourinary:  Negative for dysuria, hematuria and urgency.  Musculoskeletal:  Negative for joint pain and myalgias.  Skin:  Negative for itching and rash.  Neurological:  Negative for dizziness, tingling, weakness and headaches.  Endo/Heme/Allergies:  Negative for environmental allergies. Does not bruise/bleed easily.  Psychiatric/Behavioral:  Negative for depression. The patient is not nervous/anxious and does not have insomnia.   All other systems reviewed and are negative.    Objective:  Physical Exam Vitals reviewed.  Constitutional:      General: She is not in acute distress.    Appearance: She is well-developed.  HENT:     Head: Normocephalic and atraumatic.     Mouth/Throat:     Pharynx: No oropharyngeal exudate.  Eyes:     Conjunctiva/sclera: Conjunctivae normal.     Pupils: Pupils are equal, round, and reactive to light.  Neck:     Vascular: No JVD.     Trachea: No tracheal deviation.     Comments: Loss of supraclavicular fat Cardiovascular:     Rate and Rhythm: Tachycardia present. Rhythm irregular.     Heart sounds: S1 normal and S2 normal.     Comments: Distant heart tones Pulmonary:     Effort: No tachypnea or accessory muscle usage.     Breath sounds: No stridor. Decreased breath sounds (throughout all lung fields) present. No wheezing, rhonchi or rales.  Abdominal:     General: Bowel sounds are normal. There is no distension.     Palpations: Abdomen is soft.     Tenderness: There is no abdominal tenderness.  Musculoskeletal:        General: Deformity (muscle wasting ) present.  Skin:    General: Skin is warm and dry.     Capillary Refill: Capillary refill takes less than 2 seconds.     Findings: No rash.  Neurological:     Mental Status: She is alert and oriented to person, place, and time.  Psychiatric:        Behavior: Behavior normal.  Vitals:   04/01/23 1533  BP: (!) 140/120   Pulse: (!) 123  SpO2: 96%  Weight: 94 lb 12.8 oz (43 kg)  Height:  (1.499 m)   96% on RA BMI Readings from Last 3 Encounters:  04/01/23 19.15 kg/m  03/25/23 18.12 kg/m  03/19/23 19.35 kg/m   Wt Readings from Last 3 Encounters:  04/01/23 94 lb 12.8 oz (43 kg)  03/25/23 89 lb 11.6 oz (40.7 kg)  03/19/23 95 lb 12.8 oz (43.5 kg)     CBC    Component Value Date/Time   WBC 9.5 03/23/2023 0033   RBC 4.43 03/23/2023 0033   HGB 14.0 03/23/2023 0033   HGB 14.5 07/20/2020 0843   HGB 12.5 05/01/2017 1341   HCT 40.5 03/23/2023 0033   HCT 37.0 05/01/2017 1341   PLT 280 03/23/2023 0033   PLT 285 07/20/2020 0843   PLT 242 05/01/2017 1341   MCV 91.4 03/23/2023 0033   MCV 91.9 05/01/2017 1341   MCH 31.6 03/23/2023 0033   MCHC 34.6 03/23/2023 0033   RDW 13.5 03/23/2023 0033   RDW 12.8 05/01/2017 1341   LYMPHSABS 1.3 03/02/2023 1957   LYMPHSABS 1.8 05/01/2017 1341   MONOABS 0.6 03/02/2023 1957   MONOABS 0.5 05/01/2017 1341   EOSABS 0.1 03/02/2023 1957   EOSABS 0.1 05/01/2017 1341   BASOSABS 0.1 03/02/2023 1957   BASOSABS 0.1 05/01/2017 1341    Chest Imaging: CXR - 2016  - no active process The patient's images have been independently reviewed by me.    01/23/2019 high-resolution CT chest: No fibrotic disease.  Small areas of clustered nodules within the right middle lobe right upper lobe.  Small areas of air trapping. The patient's images have been independently reviewed by me.    Pulmonary Functions Testing Results:    Latest Ref Rng & Units 01/08/2019    2:46 PM  PFT Results  FVC-Pre L 1.08   FVC-Predicted Pre % 53   FVC-Post L 1.10   FVC-Predicted Post % 54   Pre FEV1/FVC % % 76   Post FEV1/FCV % % 80   FEV1-Pre L 0.82   FEV1-Predicted Pre % 55   FEV1-Post L 0.88   TLC L 3.65   TLC % Predicted % 84   RV % Predicted % 120     FeNO: None   Pathology: None   Echocardiogram:  09/2018 Study Conclusions   - Left ventricle: The cavity size was  normal. Systolic function was   normal. The estimated ejection fraction was in the range of 60%   to 65%. Wall motion was normal; there were no regional wall   motion abnormalities. Doppler parameters are consistent with   abnormal left ventricular relaxation (grade 1 diastolic   dysfunction). Doppler parameters are consistent with high   ventricular filling pressure. - Aortic valve: Transvalvular velocity was within the normal range.   There was no stenosis. There was mild regurgitation. - Mitral valve: Transvalvular velocity was within the normal range.   There was no evidence for stenosis. There was no regurgitation. - Left atrium: The atrium was moderately dilated. - Right ventricle: The cavity size was mildly dilated. Wall   thickness was normal. Systolic function was normal. - Tricuspid valve: There was moderate regurgitation. - Pulmonary arteries: Systolic pressure was mildly increased. PA   peak pressure: 48 mm Hg (S). - Global longitudinal strain -19.4% (normal).  Heart Catheterization: None     Assessment & Plan:   Bronchiectasis without complication  Atrial fibrillation, unspecified type  Discussion:  This is an 87 year old female longstanding history of cough, related to ACE inhibitor that was stopped.  Improvement at this point.  No significant GERD.  She also has bronchiectasis with impaired spirometry mild restriction.  Some evidence of air trapping.  Mildly elevated PA pressures on echocardiogram which could be associated with her dyspnea.  She has mild bronchiectasis on HRCT imaging.  She has been managed well with bronchodilators as needed.  Plan: Continue albuterol and ipratropium as needed Cough is improved. Continue flutter valve for clearance of bronchiectasis and pulmonary hygiene. Can return to clinic in 1 year or as needed.  As for her heart rate she has appointment to see A-fib clinic tomorrow. She was given instructions to follow-up tomorrow and ED  precautions if she her heart rate continues to climb or develops chest pain or shortness of breath.    Current Outpatient Medications:    albuterol (PROVENTIL) (2.5 MG/3ML) 0.083% nebulizer solution, Take 3 mLs (2.5 mg total) by nebulization every 6 (six) hours as needed for wheezing or shortness of breath. (Patient taking differently: Take 2.5 mg by nebulization in the morning.), Disp: 75 mL, Rfl: 12   apixaban (ELIQUIS) 2.5 MG TABS tablet, Take 1 tablet (2.5 mg total) by mouth 2 (two) times daily., Disp: 60 tablet, Rfl: 2   Calcium Carb-Cholecalciferol (CALCIUM-VITAMIN D) 500-400 MG-UNIT TABS, Take 1 tablet by mouth 2 (two) times daily., Disp: , Rfl:    Cholecalciferol (VITAMIN D-3) 25 MCG (1000 UT) CAPS, Take 1 capsule by mouth daily., Disp: , Rfl:    denosumab (PROLIA) 60 MG/ML SOSY injection, Inject 60 mg into the skin every 6 (six) months., Disp: , Rfl:    dofetilide (TIKOSYN) 125 MCG capsule, Take 1 capsule (125 mcg total) by mouth 2 (two) times daily., Disp: 60 capsule, Rfl: 0   empagliflozin (JARDIANCE) 10 MG TABS tablet, Take 1 tablet (10 mg total) by mouth daily., Disp: 30 tablet, Rfl: 0   furosemide (LASIX) 20 MG tablet, As needed if directed patient may take 20 MG Lasix PRN by mouth daily x 4 days as directed per [employer] HF Study PRN plan., Disp: 90 tablet, Rfl: 3   ipratropium (ATROVENT) 0.02 % nebulizer solution, TAKE 2.5 MLS BY NEBULIZATION EVERY 6 (SIX) HOURS AS NEEDED FOR WHEEZING OR SHORTNESS OF BREATH., Disp: 125 mL, Rfl: 6   metoprolol succinate (TOPROL-XL) 100 MG 24 hr tablet, Take 1 tablet (100 mg total) by mouth daily. Take with or immediately following a meal., Disp: 30 tablet, Rfl: 0   potassium chloride (KLOR-CON) 10 MEQ tablet, As needed if directed patient may take 10 mEq Potassium PRN by mouth daily x 4 days as directed per [employer] HF Study PRN plan., Disp: 90 tablet, Rfl: 3   Josephine Igo, DO [facility] 04/01/2023 3:50 PM

## 2023-04-02 ENCOUNTER — Ambulatory Visit (HOSPITAL_COMMUNITY)
Admit: 2023-04-02 | Discharge: 2023-04-02 | Disposition: A | Discharge status: Home / Self Care | Payer: Medicare PPO | Attending: Physician Assistant | Admitting: Physician Assistant

## 2023-04-02 ENCOUNTER — Encounter (HOSPITAL_COMMUNITY): Payer: Self-pay | Admitting: Physician Assistant

## 2023-04-02 VITALS — BP 134/90 | HR 66 | Ht 59.0 in | Wt 95.0 lb

## 2023-04-02 DIAGNOSIS — I491 Atrial premature depolarization: Secondary | ICD-10-CM | POA: Diagnosis not present

## 2023-04-02 DIAGNOSIS — I5032 Chronic diastolic (congestive) heart failure: Secondary | ICD-10-CM | POA: Insufficient documentation

## 2023-04-02 DIAGNOSIS — R008 Other abnormalities of heart beat: Secondary | ICD-10-CM | POA: Diagnosis not present

## 2023-04-02 DIAGNOSIS — I110 Hypertensive heart disease with heart failure: Secondary | ICD-10-CM | POA: Insufficient documentation

## 2023-04-02 DIAGNOSIS — Z79899 Other long term (current) drug therapy: Secondary | ICD-10-CM

## 2023-04-02 DIAGNOSIS — Z5181 Encounter for therapeutic drug level monitoring: Secondary | ICD-10-CM

## 2023-04-02 DIAGNOSIS — D6869 Other thrombophilia: Secondary | ICD-10-CM | POA: Diagnosis not present

## 2023-04-02 DIAGNOSIS — I4819 Other persistent atrial fibrillation: Secondary | ICD-10-CM | POA: Insufficient documentation

## 2023-04-02 LAB — BASIC METABOLIC PANEL
Anion gap: 8 (ref 5–15)
BUN: 23 mg/dL (ref 8–23)
CO2: 26 mmol/L (ref 22–32)
Calcium: 8.9 mg/dL (ref 8.9–10.3)
Chloride: 100 mmol/L (ref 98–111)
Creatinine, Ser: 1.01 mg/dL — ABNORMAL HIGH (ref 0.44–1.00)
GFR, Estimated: 55 mL/min — ABNORMAL LOW (ref >60)
Glucose, Bld: 108 mg/dL — ABNORMAL HIGH (ref 70–99)
Potassium: 3.6 mmol/L (ref 3.5–5.1)
Sodium: 134 mmol/L — ABNORMAL LOW (ref 135–145)

## 2023-04-02 LAB — MAGNESIUM: Magnesium: 1.8 mg/dL (ref 1.7–2.4)

## 2023-04-02 MED ORDER — DOFETILIDE 125 MCG PO CAPS: 125.0000 ug | ORAL_CAPSULE | Freq: Two times a day (BID) | ORAL | 3 refills | Status: DC

## 2023-04-02 NOTE — Progress Notes (Signed)
Primary Care Physician: Sarah Brunette, MD Primary Cardiologist: [name] Primary Electrophysiologist: [name] Referring Physician: [name] is a 87 y.o. female with a history of breast cancer, CAD, HTN, pulmonary HTN, bronchiectasis, atrial fibrillation who presents for follow up in the [facility]. Patient was admitted to [facility] 03/02/23 - 03/06/23 for new onset atrial fibrillation with RVR.  During that visit the patient underwent a TEE cardioversion which was successful.  Since discharge, the patient had been doing well until on Easter Sunday she developed repeat symptoms of SOB/DOE.  She was hopeful that her symptoms will subside; however, they persisted prompting her to come into the ED on 03/20/23. She was again in afib with RVR with acute HFpEF. EP was consulted and she was loaded on dofetilide. She converted with the medication and did not require another DCCV. Patient is on Eliquis for a CHADS2VASC score of 6.  On follow up today, patient reports that today is the best she has felt in several weeks. She remains in SR. She is happy that she slept well last night. No bleeding issues on anticoagulation.   Today, she denies symptoms of palpitations, chest pain, orthopnea, PND, dizziness, presyncope, syncope, snoring, daytime somnolence, bleeding, or neurologic sequela. The patient is tolerating medications without difficulties and is otherwise without complaint today.    Atrial Fibrillation Risk Factors:  she does not have symptoms or diagnosis of sleep apnea. she does not have a history of rheumatic fever.   she has a BMI of Body mass index is 19.19 kg/m.[name] Kitchen Filed Weights   04/02/23 0854  Weight: 43.1 kg    Family History  Problem Relation Age of Onset   Other Mother        malignant neoplasm   Bone cancer Father        malignant neoplasm   Other Sister        malignant neoplasm     Atrial Fibrillation Management  history:  Previous antiarrhythmic drugs: dofetilide  Previous cardioversions: 03/05/23 Previous ablations: none Anticoagulation history: Eliquis   Past Medical History:  Diagnosis Date   Anemia yrs ago   Aortic insufficiency    Breast cancer of lower-outer quadrant of right female breast 10/10/2015   left breat also chemo done for first   Cough    from toporol   Heart murmur    Hypertension    Osteoporosis    Spinal headache yrs ago   Past Surgical History:  Procedure Laterality Date   ABDOMINAL HYSTERECTOMY     total   APPENDECTOMY     BREAST RECONSTRUCTION WITH PLACEMENT OF TISSUE EXPANDER AND FLEX HD (ACELLULAR HYDRATED DERMIS) Right 12/13/2015   Procedure: RIGHT BREAST RECONSTRUCTION WITH PLACEMENT OF silicone gel implant and allograft tissue;  Surgeon: Sarah Sjogren, MD;  Location: [facility] OR;  Service: Plastics;  Laterality: Right;   BREAST SURGERY     CARDIOVERSION N/A 03/05/2023   Procedure: CARDIOVERSION;  Surgeon: Sarah Nose, MD;  Location: [facility] ENDOSCOPY;  Service: Cardiovascular;  Laterality: N/A;   CESAREAN SECTION     x 2   COLONOSCOPY WITH PROPOFOL N/A 04/08/2017   Procedure: COLONOSCOPY WITH PROPOFOL;  Surgeon: Sarah Bumpers, MD;  Location: [facility] ENDOSCOPY;  Service: Endoscopy;  Laterality: N/A;   HERNIA REPAIR     LOOP RECORDER INSERTION N/A 03/25/2023   Procedure: LOOP RECORDER INSERTION;  Surgeon: Sarah Prude, MD;  Location: [facility] INVASIVE CV LAB;  Service: Cardiovascular;  Laterality: N/A;   MASTECTOMY Left 1979   TEE WITHOUT CARDIOVERSION N/A 03/05/2023   Procedure: TRANSESOPHAGEAL ECHOCARDIOGRAM (TEE);  Surgeon: Sarah Nose, MD;  Location: [facility] ENDOSCOPY;  Service: Cardiovascular;  Laterality: N/A;   TONSILLECTOMY     TOTAL MASTECTOMY Right 12/13/2015   Procedure: RIGHT MASTECTOMY ;  Surgeon: Sarah Lint, MD;  Location: [facility] OR;  Service: General;  Laterality: Right;    Current Outpatient Medications  Medication Sig Dispense Refill   albuterol  (PROVENTIL) (2.5 MG/3ML) 0.083% nebulizer solution Take 3 mLs (2.5 mg total) by nebulization every 6 (six) hours as needed for wheezing or shortness of breath. (Patient taking differently: Take 2.5 mg by nebulization in the morning.) 75 mL 12   apixaban (ELIQUIS) 2.5 MG TABS tablet Take 1 tablet (2.5 mg total) by mouth 2 (two) times daily. 60 tablet 2   Calcium Carb-Cholecalciferol (CALCIUM-VITAMIN D) 500-400 MG-UNIT TABS Take 1 tablet by mouth 2 (two) times daily.     Cholecalciferol (VITAMIN D-3) 25 MCG (1000 UT) CAPS Take 1 capsule by mouth daily.     denosumab (PROLIA) 60 MG/ML SOSY injection Inject 60 mg into the skin every 6 (six) months.     empagliflozin (JARDIANCE) 10 MG TABS tablet Take 1 tablet (10 mg total) by mouth daily. 30 tablet 0   furosemide (LASIX) 20 MG tablet As needed if directed patient may take 20 MG Lasix PRN by mouth daily x 4 days as directed per [employer] HF Study PRN plan. 90 tablet 3   ipratropium (ATROVENT) 0.02 % nebulizer solution TAKE 2.5 MLS BY NEBULIZATION EVERY 6 (SIX) HOURS AS NEEDED FOR WHEEZING OR SHORTNESS OF BREATH. 125 mL 6   metoprolol succinate (TOPROL-XL) 100 MG 24 hr tablet Take 1 tablet (100 mg total) by mouth daily. Take with or immediately following a meal. 30 tablet 0   potassium chloride (KLOR-CON) 10 MEQ tablet As needed if directed patient may take 10 mEq Potassium PRN by mouth daily x 4 days as directed per [employer] HF Study PRN plan. 90 tablet 3   dofetilide (TIKOSYN) 125 MCG capsule Take 1 capsule (125 mcg total) by mouth 2 (two) times daily. 60 capsule 3   No current facility-administered medications for this encounter.    Allergies  Allergen Reactions   Amlodipine Benzoate Shortness Of Breath   Losartan Potassium Shortness Of Breath    Social History   Socioeconomic History   Marital status: Married    Spouse name: Not on file   Number of children: 2   Years of education: Not on file   Highest education level:  Not on file  Occupational History   Not on file  Tobacco Use   Smoking status: Never   Smokeless tobacco: Never   Tobacco comments:    Never smoke 04/02/23  Vaping Use   Vaping Use: Never used  Substance and Sexual Activity   Alcohol use: No   Drug use: No   Sexual activity: Not on file  Other Topics Concern   Not on file  Social History Narrative   Not on file   Social Determinants of Health   Financial Resource Strain: Not on file  Food Insecurity: Not on file  Transportation Needs: Not on file  Physical Activity: Not on file  Stress: Not on file  Social Connections: Not on file  Intimate Partner Violence: Not on file     ROS- All systems are reviewed and negative except as per the HPI above.  Physical  Exam: Vitals:   04/02/23 0854  BP: (!) 134/90  Pulse: 66  Weight: 43.1 kg  Height: 4\' 11"  (1.499 m)    GEN- The patient is a well appearing elderly female, alert and oriented x 3 today.   Head- normocephalic, atraumatic Eyes-  Sclera clear, conjunctiva pink Ears- hearing intact Oropharynx- clear Neck- supple  Lungs- Clear to ausculation bilaterally, normal work of breathing Heart- Regular rate and rhythm, no murmurs, rubs or gallops  GI- soft, NT, ND, + BS Extremities- no clubbing, cyanosis, or edema MS- no significant deformity or atrophy Skin- no rash or lesion Psych- euthymic mood, full affect Neuro- strength and sensation are intact  Wt Readings from Last 3 Encounters:  04/02/23 43.1 kg  04/01/23 43 kg  03/25/23 40.7 kg    EKG today demonstrates  SR with bigeminal PACs Vent. rate 66 BPM PR interval 136 ms QRS duration 82 ms QT/QTcB 400/419 ms  Echo 03/03/23 demonstrated 1. Left ventricular ejection fraction, by estimation, is 55 to 60%. The  left ventricle has normal function. The left ventricle has no regional  wall motion abnormalities. Left ventricular diastolic parameters are  indeterminate.   2. Right ventricular systolic function is  normal. The right ventricular  size is normal. There is mildly elevated pulmonary artery systolic  pressure. The estimated right ventricular systolic pressure is 41.2 mmHg.   3. Left atrial size was severely dilated.   4. Right atrial size was severely dilated.   5. Mild mitral valve regurgitation.   6. Tricuspid valve regurgitation is moderate.   7. The aortic valve was not well visualized. There is moderate  calcification of the aortic valve. Aortic valve regurgitation is moderate.   8. The inferior vena cava is normal in size with <50% respiratory  variability, suggesting right atrial pressure of 8 mmHg.   Epic records are reviewed at length today.  CHA2DS2-VASc Score = 6  The patient's score is based upon: CHF History: 1 HTN History: 1 Diabetes History: 0 Stroke History: 0 Vascular Disease History: 1 (coronary calcification/aortic atherosclerosis) Age Score: 2 Gender Score: 1       ASSESSMENT AND PLAN: 1. Persistent Atrial Fibrillation (ICD10:  I48.19) The patient's CHA2DS2-VASc score is 6, indicating a 9.7% annual risk of stroke.   S/p dofetilide loading 4/5-03/25/23 Patient in SR today.  Continue dofetilide 125 mcg BID. QT stable. Check bmet/mag today.  Continue Eliquis 2.5 mg BID Continue Toprol 100 mg daily  2. Secondary Hypercoagulable State (ICD10:  D68.69) The patient is at significant risk for stroke/thromboembolism based upon her CHA2DS2-VASc Score of 6.  Continue Apixaban (Eliquis).   3. Chronic HFpEF Appears euvolemic today Enrolled in [employer] HF. We discussed the rationale for the study again today.   4. HTN Stable, no changes today.   Follow up in the AF clinic in one month.    Jorja Loa PA-C [facility] [address] [place], [place] [address] [phone] 04/02/2023 9:28 AM

## 2023-04-03 ENCOUNTER — Other Ambulatory Visit (HOSPITAL_COMMUNITY): Payer: Self-pay | Admitting: *Deleted

## 2023-04-03 DIAGNOSIS — Z006 Encounter for examination for normal comparison and control in clinical research program: Secondary | ICD-10-CM

## 2023-04-03 MED ORDER — MAGNESIUM OXIDE 400 MG PO CAPS: 400.0000 mg | ORAL_CAPSULE | Freq: Every day | ORAL | 3 refills | Status: DC

## 2023-04-03 MED ORDER — FUROSEMIDE 20 MG PO TABS
20.0000 mg | ORAL_TABLET | Freq: Every day | ORAL | 3 refills | Status: DC
Start: 2023-04-03 — End: 2024-01-07

## 2023-04-03 MED ORDER — POTASSIUM CHLORIDE ER 10 MEQ PO TBCR: 10.0000 meq | EXTENDED_RELEASE_TABLET | Freq: Every day | ORAL | 3 refills | Status: DC

## 2023-04-05 ENCOUNTER — Ambulatory Visit: Payer: Medicare PPO | Attending: General Practice | Admitting: Cardiology

## 2023-04-05 ENCOUNTER — Encounter: Payer: Self-pay | Admitting: General Practice

## 2023-04-05 ENCOUNTER — Telehealth: Payer: Self-pay | Admitting: Cardiology

## 2023-04-05 VITALS — BP 148/78 | HR 67 | Ht 59.0 in | Wt 94.8 lb

## 2023-04-05 DIAGNOSIS — I4819 Other persistent atrial fibrillation: Secondary | ICD-10-CM

## 2023-04-05 DIAGNOSIS — I10 Essential (primary) hypertension: Secondary | ICD-10-CM | POA: Diagnosis not present

## 2023-04-05 DIAGNOSIS — I5032 Chronic diastolic (congestive) heart failure: Secondary | ICD-10-CM

## 2023-04-05 DIAGNOSIS — I4891 Unspecified atrial fibrillation: Secondary | ICD-10-CM | POA: Diagnosis not present

## 2023-04-05 DIAGNOSIS — D6869 Other thrombophilia: Secondary | ICD-10-CM | POA: Diagnosis not present

## 2023-04-05 MED ORDER — METOPROLOL SUCCINATE ER 100 MG PO TB24: 150.0000 mg | ORAL_TABLET | Freq: Every day | ORAL | 3 refills | Status: DC

## 2023-04-05 NOTE — Assessment & Plan Note (Signed)
Eliquis as above.

## 2023-04-05 NOTE — Progress Notes (Signed)
Cardiology Office Note:    Date:  04/05/2023   ID:  Marlowe Shores, DOB 06-19-1936, MRN [id number]  PCP:  [name], MD  [facility] Providers Cardiologist:  Olga Millers, MD Electrophysiologist:  [name], MD    Referring MD: [name], MD   Chief Complaint:  No chief complaint on file.    Patient Profile:  Atrial fibrillation S/P TEE/DCCV on 03/04/22. Recurrent afib in clinic on 4/2. Loaded on Tikosyn on 4/6 with conversion to NSR.[name] Kitchen Pulmonary hypertension RV systolic pressure 41.2 mmHg on 03/03/23 TTE. Diastolic CHF 03/03/23 TTE with LVEF 55-60%.  Moderate Aortic Insufficiency Aortic regurgitation PHT measures 492 msec.  Moderate TR Interstitial lung disease   Cardiac Studies & Procedures       ECHOCARDIOGRAM  ECHOCARDIOGRAM COMPLETE 03/04/2023  Narrative ECHOCARDIOGRAM REPORT    Patient Name:   ROCHELLA BENNER Date of Exam: 03/03/2023 Medical Rec #:  [id number]        Height:       60.0 in Accession #:    [id number]       Weight:       94.0 lb Date of Birth:  1936-12-10       BSA:          1.354 m Patient Age:    87 years         BP:           109/62 mmHg Patient Gender: F                HR:           92 bpm. Exam Location:  Inpatient  Procedure: 2D Echo, Cardiac Doppler and Color Doppler  Indications:    Atrial Fibrillation I48.91  History:        Patient has prior history of Echocardiogram examinations, most recent 04/30/2022. CHF, Aortic Valve Disease, Arrythmias:Atrial Fibrillation; Risk Factors:Hypertension. Acute heart failure with preserved ejection fraction. Malignant neoplasm of lower-outer quadrant of right female breast.  Sonographer:    Celesta Gentile RCS Referring Phys: [id number] SARA-MAIZ A THOMAS  IMPRESSIONS   1. Left ventricular ejection fraction, by estimation, is 55 to 60%. The left ventricle has normal function. The left ventricle has no regional wall motion abnormalities. Left ventricular diastolic  parameters are indeterminate. 2. Right ventricular systolic function is normal. The right ventricular size is normal. There is mildly elevated pulmonary artery systolic pressure. The estimated right ventricular systolic pressure is 41.2 mmHg. 3. Left atrial size was severely dilated. 4. Right atrial size was severely dilated. 5. Mild mitral valve regurgitation. 6. Tricuspid valve regurgitation is moderate. 7. The aortic valve was not well visualized. There is moderate calcification of the aortic valve. Aortic valve regurgitation is moderate. 8. The inferior vena cava is normal in size with <50% respiratory variability, suggesting right atrial pressure of 8 mmHg.  FINDINGS Left Ventricle: Left ventricular ejection fraction, by estimation, is 55 to 60%. The left ventricle has normal function. The left ventricle has no regional wall motion abnormalities. The left ventricular internal cavity size was normal in size. There is no left ventricular hypertrophy. Left ventricular diastolic parameters are indeterminate.  Right Ventricle: The right ventricular size is normal. Right ventricular systolic function is normal. There is mildly elevated pulmonary artery systolic pressure. The tricuspid regurgitant velocity is 2.88 m/s, and with an assumed right atrial pressure of 8 mmHg, the estimated right ventricular systolic pressure is 41.2 mmHg.  Left Atrium: Left atrial size was severely dilated.  Right Atrium: Right atrial size was severely dilated.  Pericardium: There is no evidence of pericardial effusion.  Mitral Valve: Mild mitral valve regurgitation.  Tricuspid Valve: Tricuspid valve regurgitation is moderate.  Aortic Valve: The aortic valve was not well visualized. There is moderate calcification of the aortic valve. Aortic valve regurgitation is moderate. Aortic regurgitation PHT measures 492 msec.  Pulmonic Valve: Pulmonic valve regurgitation is not visualized.  Aorta: The aortic root and  ascending aorta are structurally normal, with no evidence of dilitation.  Venous: The inferior vena cava is normal in size with less than 50% respiratory variability, suggesting right atrial pressure of 8 mmHg.  IAS/Shunts: No atrial level shunt detected by color flow Doppler.   LEFT VENTRICLE PLAX 2D LVIDd:         3.50 cm LVIDs:         2.50 cm LV PW:         0.90 cm LV IVS:        0.90 cm LVOT diam:     1.50 cm LV SV:         32 LV SV Index:   24 LVOT Area:     1.77 cm   RIGHT VENTRICLE TAPSE (M-mode): 1.8 cm  LEFT ATRIUM              Index        RIGHT ATRIUM           Index LA diam:        3.80 cm  2.81 cm/m   RA Area:     23.00 cm LA Vol (A2C):   74.5 ml  55.01 ml/m  RA Volume:   73.45 ml  54.24 ml/m LA Vol (A4C):   101.0 ml 74.58 ml/m LA Biplane Vol: 86.8 ml  64.10 ml/m AORTIC VALVE LVOT Vmax:   89.40 cm/s LVOT Vmean:  60.900 cm/s LVOT VTI:    0.182 m AI PHT:      492 msec  AORTA Ao Root diam: 2.90 cm  MITRAL VALVE                TRICUSPID VALVE MV Area (PHT): 4.68 cm     TR Peak grad:   33.2 mmHg MV Decel Time: 162 msec     TR Vmax:        288.00 cm/s MV E velocity: 129.00 cm/s SHUNTS Systemic VTI:  0.18 m Systemic Diam: 1.50 cm  Carolan Clines Electronically signed by Carolan Clines Signature Date/Time: 03/04/2023/8:43:14 AM    Final   TEE  ECHO TEE 03/05/2023  Narrative TRANSESOPHOGEAL ECHO REPORT    Patient Name:   BRAYDEN BETTERS Date of Exam: 03/05/2023 Medical Rec #:  [id number]        Height:       60.0 in Accession #:    [id number]       Weight:       90.2 lb Date of Birth:  10/14/1936       BSA:          1.330 m Patient Age:    87 years         BP:           97/53 mmHg Patient Gender: F                HR:           101 bpm. Exam Location:  Inpatient  Procedure: Cardiac Doppler, Color Doppler and Transesophageal Echo  Indications:  I48.91* Unspeicified atrial fibrillation  History:         Patient has prior history of  Echocardiogram examinations, most recent 03/03/2023. CHF, Abnormal ECG, Arrythmias:Atrial Fibrillation; Signs/Symptoms:Dyspnea and Shortness of Breath. Breast cancer.  Sonographer:     Sheralyn Boatman RDCS Referring Phys:  [id number] Jonita Albee Diagnosing Phys: Zoila Shutter MD  PROCEDURE: After discussion of the risks and benefits of a TEE, an informed consent was obtained from the patient. The transesophogeal probe was passed without difficulty through the esophogus of the patient. Sedation performed by different physician. The patient was monitored while under deep sedation. Anesthestetic sedation was provided intravenously by Anesthesiology: 75mg  of Propofol, 40mg  of Lidocaine. The patient developed Respiratory depression during the procedure. A successful direct current cardioversion was performed at 150 joules with 1 attempt. Transgastric views not obtained.  IMPRESSIONS   1. Left ventricular ejection fraction, by estimation, is 60 to 65%. The left ventricle has normal function. 2. Right ventricular systolic function is normal. The right ventricular size is normal. 3. Left atrial size was severely dilated. No left atrial/left atrial appendage thrombus was detected. 4. Right atrial size was severely dilated. 5. The mitral valve is abnormal. Mild mitral valve regurgitation. 6. The tricuspid valve is abnormal. Tricuspid valve regurgitation is moderate. 7. The aortic valve is tricuspid. Aortic valve regurgitation is mild. Aortic valve sclerosis is present, with no evidence of aortic valve stenosis.  Conclusion(s)/Recommendation(s): No LA/LAA thrombus identified. Successful cardioversion performed with restoration of normal sinus rhythm.  FINDINGS Left Ventricle: Left ventricular ejection fraction, by estimation, is 60 to 65%. The left ventricle has normal function. The left ventricular internal cavity size was normal in size. There is no left ventricular hypertrophy.  Right Ventricle:  The right ventricular size is normal. No increase in right ventricular wall thickness. Right ventricular systolic function is normal.  Left Atrium: Left atrial size was severely dilated. No left atrial/left atrial appendage thrombus was detected.  Right Atrium: Right atrial size was severely dilated.  Pericardium: There is no evidence of pericardial effusion.  Mitral Valve: The mitral valve is abnormal. Mild mitral valve regurgitation.  Tricuspid Valve: The tricuspid valve is abnormal. Tricuspid valve regurgitation is moderate.  Aortic Valve: The aortic valve is tricuspid. Aortic valve regurgitation is mild. Aortic valve sclerosis is present, with no evidence of aortic valve stenosis.  Pulmonic Valve: The pulmonic valve was grossly normal. Pulmonic valve regurgitation is trivial.  Aorta: The aortic root and ascending aorta are structurally normal, with no evidence of dilitation. There is minimal (Grade I) plaque involving the aortic arch and descending aorta.  IAS/Shunts: No atrial level shunt detected by color flow Doppler.  Additional Comments: Spectral Doppler performed.  Zoila Shutter MD Electronically signed by Zoila Shutter MD Signature Date/Time: 03/05/2023/2:21:19 PM    Final             History of Present Illness:   SHIRON WHETSEL is a 87 y.o. female with the above problem list.  She presents today for evaluation of worsening dyspnea and lower extremity edema. Patient was recently admitted from 03/02/2023 to 03/06/2023 for new onset atrial fibrillation with RVR and CHF. Chest x-ray was concerning for right lower lobe air opacities with small right pleural effusion. Chest CTA was negative for PE but showed moderate right and small left pleural effusion with associated atelectasis, cardiomegaly, and finding suggesting PAH. Echo showed LVEF of 55-60% with normal wall motion, normal RV size and function, severe biatrial enlargement, moderate AI, moderate TR, mild  MR, and mildly  elevated PASP of 41.2 mmHg. She was diuresed with IV Lasix and started on IV Cardizem and Eliquis. She then underwent successful TEE/DCCV on 03/04/2022 with restoration of sinus rhythm. She was discharged on Lasix  daily, Toprol-XL  daily, and Eliquis 2.5mg  twice daily.   Patient stopped lasix around 3/26 after reporting concern of possible rash 2/2 lasix. Patient was then seen in clinic on 4/2 for elevated HR, dyspnea, fatigue. She was found with recurrent afib. ED evaluation recommended but patient declined. She was switched to Metoprolol Tartrate from succinate in an attempt to achieve better rate control.   Patient ultimately presented to the ED on 4/4 for ongoing symptoms with afib. [facility] and received IV diuresis. Decision also made to load Tikosyn BID and patient converted to NSR on 4/5. She was also enrolled in Alleviate trial and a loop recorder was placed. At discharge on 4/8, patient was continued on Tikosyn, Metoprolol Succinate, Eliquis. She was started on Jardiance for HFpEF. Patient saw Alphonzo Severance PA in EP clinic on 4/16 where patient noted that she felt the best she had in several weeks. She was still in SR at that visit.   Today patient presents as a same day add on for evaluation of worsening lower extremity edema. Patient says that really since she left the hospital last week, she has noticed swelling in bilateral feet. In the last few days, this progressed into legs up to the knee. She reports awakening with some brief shortness of breath this morning but otherwise had no symptoms/completed household tasks without dyspnea, chest pain, palpitation. Patient also adds that she has slept very poorly since discharge, wakes up and can't go back to sleep. She thinks she slept about 4 hours last night. She has continued to take all medications as prescribed but has concerns that she is on too many medications.      Past Medical History:  Diagnosis Date    Anemia yrs ago   Aortic insufficiency    Breast cancer of lower-outer quadrant of right female breast 10/10/2015   left breat also chemo done for first   Cough    from toporol   Heart murmur    Hypertension    Osteoporosis    Spinal headache yrs ago   Current Medications: Current Meds  Medication Sig   apixaban (ELIQUIS) 2.5 MG TABS tablet Take 1 tablet (2.5 mg total) by mouth 2 (two) times daily.   Calcium Carb-Cholecalciferol (CALCIUM-VITAMIN D) 500-400 MG-UNIT TABS Take 1 tablet by mouth 2 (two) times daily.   Cholecalciferol (VITAMIN D-3) 25 MCG (1000 UT) CAPS Take 1 capsule by mouth daily.   denosumab (PROLIA) 60 MG/ML SOSY injection Inject 60 mg into the skin every 6 (six) months.   dofetilide (TIKOSYN) 125 MCG capsule Take 1 capsule (125 mcg total) by mouth 2 (two) times daily.   empagliflozin (JARDIANCE) 10 MG TABS tablet Take 1 tablet (10 mg total) by mouth daily.   furosemide (LASIX) 20 MG tablet Take 1 tablet (20 mg total) by mouth daily.   ipratropium (ATROVENT) 0.02 % nebulizer solution TAKE 2.5 MLS BY NEBULIZATION EVERY 6 (SIX) HOURS AS NEEDED FOR WHEEZING OR SHORTNESS OF BREATH.   Magnesium Oxide 400 MG CAPS Take 1 capsule (400 mg total) by mouth daily.   potassium chloride (KLOR-CON) 10 MEQ tablet Take 1 tablet (10 mEq total) by mouth daily.   [DISCONTINUED] metoprolol succinate (TOPROL-XL) 100 MG 24 hr tablet Take 1  tablet (100 mg total) by mouth daily. Take with or immediately following a meal.    Allergies:   Amlodipine benzoate and Losartan potassium   Social History   Occupational History   Not on file  Tobacco Use   Smoking status: Never   Smokeless tobacco: Never   Tobacco comments:    Never smoke 04/02/23  Vaping Use   Vaping Use: Never used  Substance and Sexual Activity   Alcohol use: No   Drug use: No   Sexual activity: Not on file    Family Hx: The patient's family history includes Bone cancer in her father; Other in her mother and  sister.  Review of Systems  Cardiovascular:  Positive for leg swelling. Negative for chest pain, dyspnea on exertion, irregular heartbeat, orthopnea and palpitations.  Respiratory:  Positive for shortness of breath (briefly this morning upon waking up).      EKGs/Labs/Other Test Reviewed:    EKG:  EKG is ordered today.  The ekg ordered today demonstrates sinus rhythm with no acute ischemic changes.    Recent Labs: 03/03/2023: TSH 2.094 03/20/2023: B Natriuretic Peptide 1,086.9 03/23/2023: Hemoglobin 14.0; Platelets 280 04/02/2023: BUN 23; Creatinine, Ser 1.01; Magnesium 1.8; Potassium 3.6; Sodium 134   Recent Lipid Panel No results for input(s): "CHOL", "TRIG", "HDL", "VLDL", "LDLCALC", "LDLDIRECT" in the last 8760 hours.    Risk Assessment/Calculations/Metrics:    CHA2DS2-VASc Score = 6   This indicates a 9.7% annual risk of stroke. The patient's score is based upon: CHF History: 1 HTN History: 1 Diabetes History: 0 Stroke History: 0 Vascular Disease History: 1 (coronary calcification/aortic atherosclerosis) Age Score: 2 Gender Score: 1        HYPERTENSION CONTROL Vitals:   04/05/23 1420 04/05/23 1559  BP: (!) 160/80 (!) 148/78    The patient's blood pressure is elevated above target today.  In order to address the patient's elevated BP: Blood pressure will be monitored at home to determine if medication changes need to be made.; A current anti-hypertensive medication was adjusted today.; Follow up with general cardiology has been recommended.       Physical Exam:    VS:  BP (!) 148/78 (BP Location: Right Arm, Patient Position: Sitting)   Pulse 67   Ht 4\' 11"  (1.499 m)   Wt 94 lb 12.8 oz (43 kg)   SpO2 (!) 88%   BMI 19.15 kg/m     Wt Readings from Last 3 Encounters:  04/05/23 94 lb 12.8 oz (43 kg)  04/02/23 95 lb (43.1 kg)  04/01/23 94 lb 12.8 oz (43 kg)    Constitutional:      Appearance: Healthy appearance. Not in distress.  Eyes:     Pupils: Pupils are  equal, round, and reactive to light.  Neck:     Comments: JVP minimally elevated. Pulmonary:     Effort: Pulmonary effort is normal.     Breath sounds: No wheezing. No rhonchi. No rales.  Cardiovascular:     PMI at left midclavicular line. Normal rate. Regular rhythm. Normal S1. Normal S2.      Murmurs: There is no murmur.     No gallop.  No click. No rub.  Pulses:    Intact distal pulses.  Edema:    Peripheral edema present.    Pretibial: bilateral 2+ edema of the pretibial area.    Ankle: bilateral 2+ edema of the ankle.    Feet: bilateral 2+ edema of the feet. Abdominal:     General: Bowel  sounds are normal.  Musculoskeletal: Normal range of motion. Skin:    General: Skin is warm and dry.  Neurological:     Mental Status: Alert and oriented to person, place and time.         ASSESSMENT & PLAN:   Atrial fibrillation with rapid ventricular response [facility]) Patient remains in NSR per clinic ECG today. Continue Tikosyn BID, Eliquis 2.5mg  BID.  Chronic diastolic heart failure Patient presented to clinic today with worsening lower extremity edema. 2+ bilateral edema to knee in lower extremities. JVP minimally elevated above clavicle. No rales/crackles/abnormal breath sounds on physical exam. Will increase Lasix to 40mg  x3 days and call to reassess edema on Monday. Continue Jardiance 10mg .   Hypercoagulable state due to persistent atrial fibrillation Eliquis as above.  Hypertension Patient with elevated BP in clinic today. BP also noted to have been elevated 2 days ago in EP clinic. Some of this elevation may be related to increased stress/poor sleep following recent hospitalization. Since patient is quite stressed about the number of medications she's currently taking, will increase Metoprolol Succinate to 150mg  QD. Patient will check BP at home.            Dispo:  No follow-ups on file.   Medication Adjustments/Labs and Tests Ordered: Current medicines are reviewed at  length with the patient today.  Concerns regarding medicines are outlined above.  Tests Ordered: Orders Placed This Encounter  Procedures   EKG 12-Lead   Medication Changes: Meds ordered this encounter  Medications   metoprolol succinate (TOPROL-XL) 100 MG 24 hr tablet    Sig: Take 1.5 tablets (150 mg total) by mouth daily. Take with or immediately following a meal.    Dispense:  135 tablet    Refill:  3   Signed, Perlie Gold, PA-C  04/05/2023 4:09 PM    [facility] [address] [place], [place], [place]  [address] Phone: [phone]; Fax: [phone]

## 2023-04-05 NOTE — Assessment & Plan Note (Signed)
Patient presented to clinic today with worsening lower extremity edema. 2+ bilateral edema to knee in lower extremities. JVP minimally elevated above clavicle. No rales/crackles/abnormal breath sounds on physical exam. Will increase Lasix to  x3 days and call to reassess edema on Monday. Continue Jardiance .

## 2023-04-05 NOTE — Telephone Encounter (Signed)
Patient called to discuss some problems she has. Please call back

## 2023-04-05 NOTE — Assessment & Plan Note (Signed)
Patient with elevated BP in clinic today. BP also noted to have been elevated 2 days ago in EP clinic. Some of this elevation may be related to increased stress/poor sleep following recent hospitalization. Since patient is quite stressed about the number of medications she's currently taking, will increase Metoprolol Succinate to  QD. Patient will check BP at home.

## 2023-04-05 NOTE — Assessment & Plan Note (Signed)
Patient remains in NSR per clinic ECG today. Continue Tikosyn BID, Eliquis 2.5mg  BID.

## 2023-04-05 NOTE — Patient Instructions (Signed)
Medication Instructions:   INCREASE METOPROLOL TO 150 MG ONCE DAILY= 1 AND 1/2 OF THE 100 MG TABLET ONCE DAILY  TAKE 40 MG OF FUROSEMIDE FOR THE NEXT 3 DAYS = 2 OF THE 20 MG TABLETS ONCE DAILY THEN REDUCE BACK TO 20 MG ONCE DAILY  *If you need a refill on your cardiac medications before your next appointment, please call your pharmacy*  Follow-Up: At [facility], you and your health needs are our priority.  As part of our continuing mission to provide you with exceptional heart care, we have created designated Provider Care Teams.  These Care Teams include your primary Cardiologist (physician) and Advanced Practice Providers (APPs -  Physician Assistants and Nurse Practitioners) who all work together to provide you with the care you need, when you need it.  We recommend signing up for the patient portal called "MyChart".  Sign up information is provided on this After Visit Summary.  MyChart is used to connect with patients for Virtual Visits (Telemedicine).  Patients are able to view lab/test results, encounter notes, upcoming appointments, etc.  Non-urgent messages can be sent to your provider as well.   To learn more about what you can do with MyChart, go to [email or web address].    Your next appointment:   2 week(s)  Provider:   Edd Fabian NP

## 2023-04-05 NOTE — Telephone Encounter (Signed)
Returned call to patient who states issues with swelling, shortness of breath and trouble sleeping. Patient reports that her legs and feet are very swollen despite taking furosemide  daily as prescribed. Patient reports trouble with shortness of breath while lying flat but states at present her breathing is fine. Patient reports that she is also only sleeping 3 hours a night for the last several nights and thinks this could be related to medications. Patient states that her BP this morning after her medications is 172/96 with HR 63. Patient is reluctant to make any changes without being seen. Scheduled patient an appointment today with Edd Fabian, NP. Patient aware of apt time and date. Advised patient to call back to office with any issues, questions, or concerns. Patient verbalized understanding.   Will forward to MD to make aware.

## 2023-04-08 ENCOUNTER — Telehealth: Payer: Self-pay | Admitting: *Deleted

## 2023-04-08 NOTE — Telephone Encounter (Addendum)
Spoke with pt, she reports her blood pressure today after taking her medications was 158/84, 154/86 and 152/87. She does report yesterday afternoon it was 134/76 and 135/76. She does report the swelling in her feet and legs is a lot better. She also reports sleeping much better and that makes her feel so much better today.

## 2023-04-10 ENCOUNTER — Other Ambulatory Visit (HOSPITAL_COMMUNITY): Payer: Self-pay

## 2023-04-15 NOTE — Progress Notes (Unsigned)
Cardiology Clinic Note   Patient Name: Sarah Lewis Date of Encounter: 04/16/2023  Primary Care Provider:  [name], MD Primary Cardiologist:  Olga Millers, MD  Patient Profile    Sarah Lewis 87 year old female presents the clinic for follow-up evaluation of her chronic diastolic CHF and atrial fibrillation.  Past Medical History    Past Medical History:  Diagnosis Date   Anemia yrs ago   Aortic insufficiency    Breast cancer of lower-outer quadrant of right female breast (HCC) 10/10/2015   left breat also chemo done for first   Cough    from toporol   Heart murmur    Hypertension    Osteoporosis    Spinal headache yrs ago   Past Surgical History:  Procedure Laterality Date   ABDOMINAL HYSTERECTOMY     total   APPENDECTOMY     BREAST RECONSTRUCTION WITH PLACEMENT OF TISSUE EXPANDER AND FLEX HD (ACELLULAR HYDRATED DERMIS) Right 12/13/2015   Procedure: RIGHT BREAST RECONSTRUCTION WITH PLACEMENT OF silicone gel implant and allograft tissue;  Surgeon: [name], MD;  Location: [facility] OR;  Service: Plastics;  Laterality: Right;   BREAST SURGERY     CARDIOVERSION N/A 03/05/2023   Procedure: CARDIOVERSION;  Surgeon: [name], MD;  Location: [facility] ENDOSCOPY;  Service: Cardiovascular;  Laterality: N/A;   CESAREAN SECTION     x 2   COLONOSCOPY WITH PROPOFOL N/A 04/08/2017   Procedure: COLONOSCOPY WITH PROPOFOL;  Surgeon: [name], MD;  Location: [facility] ENDOSCOPY;  Service: Endoscopy;  Laterality: N/A;   HERNIA REPAIR     LOOP RECORDER INSERTION N/A 03/25/2023   Procedure: LOOP RECORDER INSERTION;  Surgeon: [name], MD;  Location: [facility] INVASIVE CV LAB;  Service: Cardiovascular;  Laterality: N/A;   MASTECTOMY Left 1979   TEE WITHOUT CARDIOVERSION N/A 03/05/2023   Procedure: TRANSESOPHAGEAL ECHOCARDIOGRAM (TEE);  Surgeon: [name], MD;  Location: [facility] ENDOSCOPY;  Service: Cardiovascular;  Laterality: N/A;   TONSILLECTOMY     TOTAL MASTECTOMY  Right 12/13/2015   Procedure: RIGHT MASTECTOMY ;  Surgeon: [name], MD;  Location: [facility] OR;  Service: General;  Laterality: Right;    Allergies  Allergies  Allergen Reactions   Amlodipine Benzoate Shortness Of Breath   Losartan Potassium Shortness Of Breath    History of Present Illness    Sarah Lewis has a PMH of chronic diastolic CHF LVEF 55-60%, pulmonary hypertension, atrial relation status post TEE/DCCV 03/04/2022 with recurrent atrial fibrillation on 4/2.  She was loaded on Tikosyn and converted on 462 normal sinus rhythm.  Her PMH also includes moderate tricuspid valve regurgitation, and interstitial lung disease.  She stopped furosemide 03/12/2023 with concern for possible drug rash.  She was seen in the clinic on 03/19/2023 due to elevated heart rate and dyspnea and fatigue.  She was found to be in recurrent atrial fibrillation.  ED evaluation was recommended however she declined.  She was transition from metoprolol to succinate to metoprolol to tartrate for rate control.  She presented to the emergency department on 03/21/2023 for ongoing symptoms of atrial fibrillation.  She received IV diuresis and was located on Tikosyn 125 mcg twice daily.  She converted to sinus rhythm on 4/5.  She was enrolled in the Alleviate trial and loop recorder was placed.  She was discharged in stable condition on 03/25/2023.  She was also started on Jardiance for HFpEF.  She followed up with the A-fib clinic on 04/02/2023.  She continued to  feel well.  She maintained sinus rhythm.  She was seen by Perlie Gold, PA-C on 04/05/2023.  She was a same-day add-on for worsening lower extremity edema.  She was noted to have progressing lower extremity edema up to her bilateral knees.  She noted increased shortness of breath with ambulation.  She reported poor sleep.  She remained in sinus rhythm.  Her furosemide was increased to 40 mg x 3 days.  She presents to the clinic today for follow-up evaluation and states  she is feeling much better since the extra fluid has gone away.  She has been monitoring her blood pressure at home and it has gradually come down to the 120-130 systolic range.  We reviewed the importance of low-sodium diet and continue to monitor her blood pressure.  She reports that she had recent lab work drawn at her PCP.  I will request this.  We will plan follow-up in 4 to 6 months.  Today she denies chest pain, shortness of breath, lower extremity edema, fatigue, palpitations, melena, hematuria, hemoptysis, diaphoresis, weakness, presyncope, syncope, orthopnea, and PND.   Home Medications    Prior to Admission medications   Medication Sig Start Date End Date Taking? Authorizing Provider  albuterol (PROVENTIL) (2.5 MG/3ML) 0.083% nebulizer solution Take 3 mLs (2.5 mg total) by nebulization every 6 (six) hours as needed for wheezing or shortness of breath. Patient not taking: Reported on 04/05/2023 09/19/20   Josephine Igo, DO  apixaban (ELIQUIS) 2.5 MG TABS tablet Take 1 tablet (2.5 mg total) by mouth 2 (two) times daily. 03/06/23   Almon Hercules, MD  Calcium Carb-Cholecalciferol (CALCIUM-VITAMIN D) 500-400 MG-UNIT TABS Take 1 tablet by mouth 2 (two) times daily.    [provider]  Cholecalciferol (VITAMIN D-3) 25 MCG (1000 UT) CAPS Take 1 capsule by mouth daily.    [provider]  denosumab (PROLIA) 60 MG/ML SOSY injection Inject 60 mg into the skin every 6 (six) months. 09/16/17   [provider]  dofetilide (TIKOSYN) 125 MCG capsule Take 1 capsule (125 mcg total) by mouth 2 (two) times daily. 04/02/23 05/02/23  Fenton, Clint R, PA  empagliflozin (JARDIANCE) 10 MG TABS tablet Take 1 tablet (10 mg total) by mouth daily. 03/26/23 04/25/23  Arrien, York Ram, MD  furosemide (LASIX) 20 MG tablet Take 1 tablet (20 mg total) by mouth daily. 04/03/23   Fenton, Clint R, PA  ipratropium (ATROVENT) 0.02 % nebulizer solution TAKE 2.5 MLS BY NEBULIZATION EVERY 6 (SIX) HOURS  AS NEEDED FOR WHEEZING OR SHORTNESS OF BREATH. 11/29/22   Icard, Rachel Bo, DO  Magnesium Oxide 400 MG CAPS Take 1 capsule (400 mg total) by mouth daily. 04/03/23   Fenton, Clint R, PA  metoprolol succinate (TOPROL-XL) 100 MG 24 hr tablet Take 1.5 tablets (150 mg total) by mouth daily. Take with or immediately following a meal. 04/05/23   Perlie Gold, PA-C  potassium chloride (KLOR-CON) 10 MEQ tablet Take 1 tablet (10 mEq total) by mouth daily. 04/03/23   Fenton, Levonne Spiller R, PA    Family History    Family History  Problem Relation Age of Onset   Other Mother        malignant neoplasm   Bone cancer Father        malignant neoplasm   Other Sister        malignant neoplasm   She indicated that her mother is deceased. She indicated that her father is deceased. She indicated that the status of her  sister is unknown.  Social History    Social History   Socioeconomic History   Marital status: Married    Spouse name: Not on file   Number of children: 2   Years of education: Not on file   Highest education level: Not on file  Occupational History   Not on file  Tobacco Use   Smoking status: Never   Smokeless tobacco: Never   Tobacco comments:    Never smoke 04/02/23  Vaping Use   Vaping Use: Never used  Substance and Sexual Activity   Alcohol use: No   Drug use: No   Sexual activity: Not on file  Other Topics Concern   Not on file  Social History Narrative   Not on file   Social Determinants of Health   Financial Resource Strain: Not on file  Food Insecurity: Not on file  Transportation Needs: Not on file  Physical Activity: Not on file  Stress: Not on file  Social Connections: Not on file  Intimate Partner Violence: Not on file     Review of Systems    General:  No chills, fever, night sweats or weight changes.  Cardiovascular:  No chest pain, dyspnea on exertion, edema, orthopnea, palpitations, paroxysmal nocturnal dyspnea. Dermatological: No rash,  lesions/masses Respiratory: No cough, dyspnea Urologic: No hematuria, dysuria Abdominal:   No nausea, vomiting, diarrhea, bright red blood per rectum, melena, or hematemesis Neurologic:  No visual changes, wkns, changes in mental status. All other systems reviewed and are otherwise negative except as noted above.  Physical Exam    VS:  BP 136/76   Pulse 82   Ht 4\' 11"  (1.499 m)   Wt 88 lb 3.2 oz (40 kg)   SpO2 91%   BMI 17.81 kg/m  , BMI Body mass index is 17.81 kg/m. GEN: Well nourished, well developed, in no acute distress. HEENT: normal. Neck: Supple, no JVD, carotid bruits, or masses. Cardiac: RRR, no murmurs, rubs, or gallops. No clubbing, cyanosis, edema.  Radials/DP/PT 2+ and equal bilaterally.  Respiratory:  Respirations regular and unlabored, clear to auscultation bilaterally. GI: Soft, nontender, nondistended, BS + x 4. MS: no deformity or atrophy. Skin: warm and dry, no rash. Neuro:  Strength and sensation are intact. Psych: Normal affect.  Accessory Clinical Findings    Recent Labs: 03/03/2023: TSH 2.094 03/20/2023: B Natriuretic Peptide 1,086.9 03/23/2023: Hemoglobin 14.0; Platelets 280 04/02/2023: BUN 23; Creatinine, Ser 1.01; Magnesium 1.8; Potassium 3.6; Sodium 134   Recent Lipid Panel No results found for: "CHOL", "TRIG", "HDL", "CHOLHDL", "VLDL", "LDLCALC", "LDLDIRECT"       ECG personally reviewed by me today-none today.  Echocardiogram 03/05/2023 IMPRESSIONS     1. Left ventricular ejection fraction, by estimation, is 60 to 65%. The  left ventricle has normal function.   2. Right ventricular systolic function is normal. The right ventricular  size is normal.   3. Left atrial size was severely dilated. No left atrial/left atrial  appendage thrombus was detected.   4. Right atrial size was severely dilated.   5. The mitral valve is abnormal. Mild mitral valve regurgitation.   6. The tricuspid valve is abnormal. Tricuspid valve regurgitation is   moderate.   7. The aortic valve is tricuspid. Aortic valve regurgitation is mild.  Aortic valve sclerosis is present, with no evidence of aortic valve  stenosis.   Conclusion(s)/Recommendation(s): No LA/LAA thrombus identified. Successful  cardioversion performed with restoration of normal sinus rhythm.   FINDINGS   Left Ventricle: Left  ventricular ejection fraction, by estimation, is 60  to 65%. The left ventricle has normal function. The left ventricular  internal cavity size was normal in size. There is no left ventricular  hypertrophy.   Right Ventricle: The right ventricular size is normal. No increase in  right ventricular wall thickness. Right ventricular systolic function is  normal.   Left Atrium: Left atrial size was severely dilated. No left atrial/left  atrial appendage thrombus was detected.   Right Atrium: Right atrial size was severely dilated.   Pericardium: There is no evidence of pericardial effusion.   Mitral Valve: The mitral valve is abnormal. Mild mitral valve  regurgitation.   Tricuspid Valve: The tricuspid valve is abnormal. Tricuspid valve  regurgitation is moderate.   Aortic Valve: The aortic valve is tricuspid. Aortic valve regurgitation is  mild. Aortic valve sclerosis is present, with no evidence of aortic valve  stenosis.   Pulmonic Valve: The pulmonic valve was grossly normal. Pulmonic valve  regurgitation is trivial.   Aorta: The aortic root and ascending aorta are structurally normal, with  no evidence of dilitation. There is minimal (Grade I) plaque involving the  aortic arch and descending aorta.   IAS/Shunts: No atrial level shunt detected by color flow Doppler.   Additional Comments: Spectral Doppler performed.   Zoila Shutter MD  Electronically signed by Zoila Shutter MD  Signature Date/Time: 03/05/2023/2:21:19 PM        Final      Assessment & Plan   1.  Chronic diastolic CHF-euvolemic today.  Weight today 88.2  pounds. Heart healthy low-sodium diet-salty 6 given Elevate lower extremities when not active Daily weights and weight log-contact office with a weight increase of 2 to 3 pounds overnight or 5 pounds in 1 week. Continue Jardiance, furosemide, metoprolol, potassium  Atrial fibrillation-heart rate today 82 bpm.  Compliant with Tikosyn, metoprolol, and apixaban.  Denies bleeding issues. Continue to follow with A-fib clinic Avoid triggers caffeine, chocolate, EtOH, dehydration etc.  Essential hypertension-BP today 136/76.  Tolerating increased metoprolol well.  Maintaining blood pressure log. Continue current medications Maintain blood pressure log  Disposition: Follow-up with Dr. Jens Som or me in 4-6 months.   Thomasene Ripple. Heaton Sarin NP-C     04/16/2023, 2:46 PM [facility] [address] Office [phone] Fax [phone]    I spent 14 minutes examining this patient, reviewing medications, and using patient centered shared decision making involving her cardiac care.  Prior to her visit I spent greater than 20 minutes reviewing her past medical history,  medications, and prior cardiac tests.

## 2023-04-16 ENCOUNTER — Encounter: Payer: Self-pay | Admitting: General Practice

## 2023-04-16 ENCOUNTER — Ambulatory Visit: Payer: Medicare PPO | Attending: General Practice | Admitting: General Practice

## 2023-04-16 VITALS — BP 136/76 | HR 82 | Ht 59.0 in | Wt 88.2 lb

## 2023-04-16 DIAGNOSIS — I10 Essential (primary) hypertension: Secondary | ICD-10-CM

## 2023-04-16 DIAGNOSIS — I5032 Chronic diastolic (congestive) heart failure: Secondary | ICD-10-CM | POA: Diagnosis not present

## 2023-04-16 DIAGNOSIS — I4891 Unspecified atrial fibrillation: Secondary | ICD-10-CM

## 2023-04-16 NOTE — Patient Instructions (Signed)
Medication Instructions:  The current medical regimen is effective;  continue present plan and medications as directed. Please refer to the Current Medication list given to you today.  *If you need a refill on your cardiac medications before your next appointment, please call your pharmacy*  Lab Work: NONE If you have labs (blood work) drawn today and your tests are completely normal, you will receive your results only by: MyChart Message (if you have MyChart) OR A paper copy in the mail If you have any lab test that is abnormal or we need to change your treatment, we will call you to review the results.  Follow-Up: At [facility], you and your health needs are our priority.  As part of our continuing mission to provide you with exceptional heart care, we have created designated Provider Care Teams.  These Care Teams include your primary Cardiologist (physician) and Advanced Practice Providers (APPs -  Physician Assistants and Nurse Practitioners) who all work together to provide you with the care you need, when you need it.  We recommend signing up for the patient portal called "MyChart".  Sign up information is provided on this After Visit Summary.  MyChart is used to connect with patients for Virtual Visits (Telemedicine).  Patients are able to view lab/test results, encounter notes, upcoming appointments, etc.  Non-urgent messages can be sent to your provider as well.   To learn more about what you can do with MyChart, go to [email or web address].    Your next appointment:   4-6 month(s)  Provider:   Olga Millers, MD     Other Instructions SECONDARY CAUSES FOR HYPERTENSION:  Hormonal contraceptives, Steroids, stimulants, antidepressants, weight loss medications, NSAIDs,  alcohol, caffeine, licorice, ginseng, St. John's wort.  IF SYSTOLIC REMAINS (TOP NUMBER) >027 CALL/CONTACT us   MAY SLOWLY INCREASE EXERCISES  TAKE AND LOG YOUR BLOOD PRESSURE, AT LEASE 1 HOUR AFTER  TAKING YOUR MEDICATION  Quality Sleep Information, Adult Quality sleep is important for your mental and physical health. It also improves your quality of life. Quality sleep means you: Are asleep for most of the time you are in bed. Fall asleep within 30 minutes. Wake up no more than once a night. Are awake for no longer than 20 minutes if you do wake up during the night. Most adults need 7-8 hours of quality sleep each night. How can poor sleep affect me? If you do not get enough quality sleep, you may have: Mood swings. Daytime sleepiness. Decreased alertness, reaction time, and concentration. Sleep disorders, such as insomnia and sleep apnea. Difficulty with: Solving problems. Coping with stress. Paying attention. These issues may affect your performance and productivity at work, school, and home. Lack of sleep may also put you at higher risk for accidents, suicide, and risky behaviors. If you do not get quality sleep, you may also be at higher risk for several health problems, including: Infections. Type 2 diabetes. Heart disease. High blood pressure. Obesity. Worsening of long-term conditions, like arthritis, kidney disease, depression, Parkinson's disease, and epilepsy. What actions can I take to get more quality sleep? Sleep schedule and routine Stick to a sleep schedule. Go to sleep and wake up at about the same time each day. Do not try to sleep less on weekdays and make up for lost sleep on weekends. This does not work. Limit naps during the day to 30 minutes or less. Do not take naps in the late afternoon. Make time to relax before bed. Reading, listening to music,  or taking a hot bath promotes quality sleep. Make your bedroom a place that promotes quality sleep. Keep your bedroom dark, quiet, and at a comfortable room temperature. Make sure your bed is comfortable. Avoid using electronic devices that give off bright blue light for 30 minutes before bedtime. Your brain  perceives bright blue light as sunlight. This includes television, phones, and computers. If you are lying awake in bed for longer than 20 minutes, get up and do a relaxing activity until you feel sleepy. Lifestyle     Try to get at least 30 minutes of exercise on most days. Do not exercise 2-3 hours before going to bed. Do not use any products that contain nicotine or tobacco. These products include cigarettes, chewing tobacco, and vaping devices, such as e-cigarettes. If you need help quitting, ask your health care provider. Do not drink caffeinated beverages for at least 8 hours before going to bed. Coffee, tea, and some sodas contain caffeine. Do not drink alcohol or eat large meals close to bedtime. Try to get at least 30 minutes of sunlight every day. Morning sunlight is best. Medical concerns Work with your health care provider to treat medical conditions that may affect sleeping, such as: Nasal obstruction. Snoring. Sleep apnea and other sleep disorders. Talk to your health care provider if you think any of your prescription medicines may cause you to have difficulty falling or staying asleep. If you have sleep problems, talk with a sleep consultant. If you think you have a sleep disorder, talk with your health care provider about getting evaluated by a specialist. Where to find more information Sleep Foundation: sleepfoundation.org American Academy of Sleep Medicine: aasm.org Centers for Disease Control and Prevention (CDC): TonerPromos.no Contact a health care provider if: You have trouble getting to sleep or staying asleep. You often wake up very early in the morning and cannot get back to sleep. You have daytime sleepiness. You have daytime sleep attacks of suddenly falling asleep and sudden muscle weakness (narcolepsy). You have a tingling sensation in your legs with a strong urge to move your legs (restless legs syndrome). You stop breathing briefly during sleep (sleep apnea). You  think you have a sleep disorder or are taking a medicine that is affecting your quality of sleep. Summary Most adults need 7-8 hours of quality sleep each night. Getting enough quality sleep is important for your mental and physical health. Make your bedroom a place that promotes quality sleep, and avoid things that may cause you to have poor sleep, such as alcohol, caffeine, smoking, or large meals. Talk to your health care provider if you have trouble falling asleep or staying asleep. This information is not intended to replace advice given to you by your health care provider. Make sure you discuss any questions you have with your health care provider. Document Revised: 03/28/2022 Document Reviewed: 03/28/2022 Elsevier Patient Education  2023 [employer].

## 2023-04-22 ENCOUNTER — Telehealth: Payer: Self-pay | Admitting: Cardiology

## 2023-04-22 MED ORDER — AMLODIPINE BESYLATE 2.5 MG PO TABS: 2.5000 mg | ORAL_TABLET | Freq: Every day | ORAL | 3 refills | Status: DC

## 2023-04-22 NOTE — Telephone Encounter (Signed)
Spoke with pt, she does not remember having problems with the amlodipine. New script sent to the pharmacy. She will let us know if problems.

## 2023-04-22 NOTE — Telephone Encounter (Signed)
Pt c/o BP issue: STAT if pt c/o blurred vision, one-sided weakness or slurred speech  1. What are your last 5 BP readings?  Today - 172/90 Yesterday - 144/84 Saturday - 156/84 Friday - 144/87  2. Are you having any other symptoms (ex. Dizziness, headache, blurred vision, passed out)? No  3. What is your BP issue? Pt states that BP seems to be up and down. Calling to report reading per provider at last visit

## 2023-04-22 NOTE — Telephone Encounter (Signed)
Left message for pt to call.

## 2023-04-22 NOTE — Telephone Encounter (Signed)
Patient states today's BP 172/90 was 2 hours after metoprolol (9am).  The others, she states she thinks after medication. States she has not been in the 140's since April.  Concerned at the high numbers and fluctuating.  No side effects.  She takes in the morning. She states went to fitness center and was really low at 128/75  120/70  114/67  116/70  (Takes them back to back). Does not think she was hydrated so could be the issue.   April 29  140/77  128/75  133/69  (Takes them back to back) April 30  136/76 May 1    159/94  157/92  158/86 May 2    162/96  172/90  159/90 May 3     144/87 May 4     156/84 May 6     144/84  Metoprolol succinate 100 mg  1 1/2 tablet Daily

## 2023-04-23 ENCOUNTER — Other Ambulatory Visit (HOSPITAL_COMMUNITY): Payer: Self-pay

## 2023-04-26 ENCOUNTER — Telehealth: Payer: Self-pay | Admitting: Cardiology

## 2023-04-26 ENCOUNTER — Other Ambulatory Visit (HOSPITAL_COMMUNITY): Payer: Self-pay

## 2023-04-26 ENCOUNTER — Telehealth: Payer: Self-pay

## 2023-04-26 DIAGNOSIS — Z006 Encounter for examination for normal comparison and control in clinical research program: Secondary | ICD-10-CM

## 2023-04-26 NOTE — Telephone Encounter (Signed)
-----   Message from Graciella Freer, PA-C sent at 04/26/2023 11:15 AM EDT -----   Discussed with Dr. Lalla Brothers  Pt is adamant to have her Loop recorder removed.   Can we please schedule her to come to the cath lab as an outpatient to have this done?  Otherwise is office visits are just so far out.

## 2023-04-26 NOTE — Telephone Encounter (Signed)
Left message for patient to call back  

## 2023-04-26 NOTE — Telephone Encounter (Signed)
Spoke with the patient and scheduled her an appointment with Dr. Lalla Brothers to have her loop recorder removed.

## 2023-04-26 NOTE — Telephone Encounter (Signed)
Patient is returning phone call.  °

## 2023-05-03 ENCOUNTER — Encounter (HOSPITAL_COMMUNITY): Payer: Self-pay | Admitting: Physician Assistant

## 2023-05-03 ENCOUNTER — Other Ambulatory Visit (HOSPITAL_COMMUNITY): Payer: Self-pay

## 2023-05-03 ENCOUNTER — Ambulatory Visit (HOSPITAL_COMMUNITY)
Admission: RE | Admit: 2023-05-03 | Discharge: 2023-05-03 | Disposition: A | Discharge status: Home / Self Care | Payer: Medicare PPO | Source: Ambulatory Visit | Attending: Physician Assistant | Admitting: Physician Assistant

## 2023-05-03 ENCOUNTER — Other Ambulatory Visit (HOSPITAL_COMMUNITY): Payer: Self-pay | Admitting: Physician Assistant

## 2023-05-03 VITALS — BP 130/72 | HR 60 | Ht 59.0 in | Wt 86.8 lb

## 2023-05-03 DIAGNOSIS — Z7901 Long term (current) use of anticoagulants: Secondary | ICD-10-CM | POA: Diagnosis not present

## 2023-05-03 DIAGNOSIS — Z79899 Other long term (current) drug therapy: Secondary | ICD-10-CM | POA: Diagnosis not present

## 2023-05-03 DIAGNOSIS — I110 Hypertensive heart disease with heart failure: Secondary | ICD-10-CM | POA: Diagnosis not present

## 2023-05-03 DIAGNOSIS — I272 Pulmonary hypertension, unspecified: Secondary | ICD-10-CM | POA: Insufficient documentation

## 2023-05-03 DIAGNOSIS — Z5181 Encounter for therapeutic drug level monitoring: Secondary | ICD-10-CM

## 2023-05-03 DIAGNOSIS — I2510 Atherosclerotic heart disease of native coronary artery without angina pectoris: Secondary | ICD-10-CM | POA: Diagnosis not present

## 2023-05-03 DIAGNOSIS — D6869 Other thrombophilia: Secondary | ICD-10-CM | POA: Diagnosis not present

## 2023-05-03 DIAGNOSIS — D6859 Other primary thrombophilia: Secondary | ICD-10-CM | POA: Diagnosis not present

## 2023-05-03 DIAGNOSIS — I4819 Other persistent atrial fibrillation: Secondary | ICD-10-CM | POA: Diagnosis not present

## 2023-05-03 DIAGNOSIS — I5032 Chronic diastolic (congestive) heart failure: Secondary | ICD-10-CM | POA: Insufficient documentation

## 2023-05-03 LAB — BASIC METABOLIC PANEL
Anion gap: 8 (ref 5–15)
BUN: 17 mg/dL (ref 8–23)
CO2: 30 mmol/L (ref 22–32)
Calcium: 8.7 mg/dL — ABNORMAL LOW (ref 8.9–10.3)
Chloride: 97 mmol/L — ABNORMAL LOW (ref 98–111)
Creatinine, Ser: 0.79 mg/dL (ref 0.44–1.00)
GFR, Estimated: >60 mL/min (ref >60)
Glucose, Bld: 96 mg/dL (ref 70–99)
Potassium: 3.2 mmol/L — ABNORMAL LOW (ref 3.5–5.1)
Sodium: 135 mmol/L (ref 135–145)

## 2023-05-03 LAB — MAGNESIUM: Magnesium: 2.1 mg/dL (ref 1.7–2.4)

## 2023-05-03 MED ORDER — MAGNESIUM OXIDE 400 MG PO CAPS: 400.0000 mg | ORAL_CAPSULE | Freq: Every day | ORAL | 6 refills | Status: DC

## 2023-05-03 NOTE — Progress Notes (Signed)
Primary Care Physician: [name], MD Primary Cardiologist: Dr Jens Som Primary Electrophysiologist: [name] Referring Physician: [name] is a 87 y.o. female with a history of breast cancer, CAD, HTN, pulmonary HTN, bronchiectasis, atrial fibrillation who presents for follow up in the [facility]. Patient was admitted to [facility] 03/02/23 - 03/06/23 for new onset atrial fibrillation with RVR.  During that visit the patient underwent a TEE cardioversion which was successful.  Since discharge, the patient had been doing well until on Easter Sunday she developed repeat symptoms of SOB/DOE.  She was hopeful that her symptoms will subside; however, they persisted prompting her to come into the ED on 03/20/23. She was again in afib with RVR with acute HFpEF. EP was consulted and she was loaded on dofetilide. She converted with the medication and did not require another DCCV. Patient is on Eliquis for a CHADS2VASC score of 6.  On follow up today, patient reports that she has done well since her last visit. She states that she "feels better everyday" but is still not quite back to her baseline. She is in SR today. No bleeding issues on anticoagulation. She does have some very mild ankle edema after starting amlodipine. No other symptoms of fluid overload.   Today, she denies symptoms of palpitations, chest pain, orthopnea, PND, dizziness, presyncope, syncope, snoring, daytime somnolence, bleeding, or neurologic sequela. The patient is tolerating medications without difficulties and is otherwise without complaint today.    Atrial Fibrillation Risk Factors:  she does not have symptoms or diagnosis of sleep apnea. she does not have a history of rheumatic fever.   she has a BMI of Body mass index is 17.53 kg/m.[name] Kitchen Filed Weights   05/03/23 1014  Weight: 39.4 kg    Family History  Problem Relation Age of Onset   Other Mother        malignant  neoplasm   Bone cancer Father        malignant neoplasm   Other Sister        malignant neoplasm     Atrial Fibrillation Management history:  Previous antiarrhythmic drugs: dofetilide  Previous cardioversions: 03/05/23 Previous ablations: none Anticoagulation history: Eliquis   Past Medical History:  Diagnosis Date   Anemia yrs ago   Aortic insufficiency    Breast cancer of lower-outer quadrant of right female breast (HCC) 10/10/2015   left breat also chemo done for first   Cough    from toporol   Heart murmur    Hypertension    Osteoporosis    Spinal headache yrs ago   Past Surgical History:  Procedure Laterality Date   ABDOMINAL HYSTERECTOMY     total   APPENDECTOMY     BREAST RECONSTRUCTION WITH PLACEMENT OF TISSUE EXPANDER AND FLEX HD (ACELLULAR HYDRATED DERMIS) Right 12/13/2015   Procedure: RIGHT BREAST RECONSTRUCTION WITH PLACEMENT OF silicone gel implant and allograft tissue;  Surgeon: [name], MD;  Location: [facility] OR;  Service: Plastics;  Laterality: Right;   BREAST SURGERY     CARDIOVERSION N/A 03/05/2023   Procedure: CARDIOVERSION;  Surgeon: [name], MD;  Location: [facility] ENDOSCOPY;  Service: Cardiovascular;  Laterality: N/A;   CESAREAN SECTION     x 2   COLONOSCOPY WITH PROPOFOL N/A 04/08/2017   Procedure: COLONOSCOPY WITH PROPOFOL;  Surgeon: [name], MD;  Location: [facility] ENDOSCOPY;  Service: Endoscopy;  Laterality: N/A;   HERNIA REPAIR     LOOP RECORDER INSERTION  N/A 03/25/2023   Procedure: LOOP RECORDER INSERTION;  Surgeon: [name], MD;  Location: [facility] INVASIVE CV LAB;  Service: Cardiovascular;  Laterality: N/A;   MASTECTOMY Left 1979   TEE WITHOUT CARDIOVERSION N/A 03/05/2023   Procedure: TRANSESOPHAGEAL ECHOCARDIOGRAM (TEE);  Surgeon: [name], MD;  Location: [facility] ENDOSCOPY;  Service: Cardiovascular;  Laterality: N/A;   TONSILLECTOMY     TOTAL MASTECTOMY Right 12/13/2015   Procedure: RIGHT MASTECTOMY ;  Surgeon: [name], MD;   Location: [facility] OR;  Service: General;  Laterality: Right;    Current Outpatient Medications  Medication Sig Dispense Refill   amLODipine (NORVASC) 2.5 MG tablet Take 1 tablet (2.5 mg total) by mouth daily. 180 tablet 3   apixaban (ELIQUIS) 2.5 MG TABS tablet Take 1 tablet (2.5 mg total) by mouth 2 (two) times daily. 60 tablet 2   Calcium Carb-Cholecalciferol (CALCIUM-VITAMIN D) 500-400 MG-UNIT TABS Take 1 tablet by mouth 2 (two) times daily.     Cholecalciferol (VITAMIN D-3) 25 MCG (1000 UT) CAPS Take 1 capsule by mouth daily.     denosumab (PROLIA) 60 MG/ML SOSY injection Inject 60 mg into the skin every 6 (six) months.     dofetilide (TIKOSYN) 125 MCG capsule Take 1 capsule (125 mcg total) by mouth 2 (two) times daily. 60 capsule 3   furosemide (LASIX) 20 MG tablet Take 1 tablet (20 mg total) by mouth daily. 90 tablet 3   ipratropium (ATROVENT) 0.02 % nebulizer solution TAKE 2.5 MLS BY NEBULIZATION EVERY 6 (SIX) HOURS AS NEEDED FOR WHEEZING OR SHORTNESS OF BREATH. 125 mL 6   Magnesium Oxide 400 MG CAPS Take 1 capsule (400 mg total) by mouth daily. 30 capsule 3   metoprolol succinate (TOPROL-XL) 100 MG 24 hr tablet Take 1.5 tablets (150 mg total) by mouth daily. Take with or immediately following a meal. 135 tablet 3   albuterol (PROVENTIL) (2.5 MG/3ML) 0.083% nebulizer solution Take 3 mLs (2.5 mg total) by nebulization every 6 (six) hours as needed for wheezing or shortness of breath. (Patient not taking: Reported on 04/05/2023) 75 mL 12   potassium chloride (KLOR-CON) 10 MEQ tablet Take 1 tablet (10 mEq total) by mouth daily. (Patient not taking: Reported on 05/03/2023) 90 tablet 3   No current facility-administered medications for this encounter.    Allergies  Allergen Reactions   Amlodipine Benzoate Shortness Of Breath   Losartan Potassium Shortness Of Breath    Social History   Socioeconomic History   Marital status: Married    Spouse name: Not on file   Number of children: 2    Years of education: Not on file   Highest education level: Not on file  Occupational History   Not on file  Tobacco Use   Smoking status: Never   Smokeless tobacco: Never   Tobacco comments:    Never smoke 04/02/23  Vaping Use   Vaping Use: Never used  Substance and Sexual Activity   Alcohol use: No   Drug use: No   Sexual activity: Not on file  Other Topics Concern   Not on file  Social History Narrative   Not on file   Social Determinants of Health   Financial Resource Strain: Not on file  Food Insecurity: Not on file  Transportation Needs: Not on file  Physical Activity: Not on file  Stress: Not on file  Social Connections: Not on file  Intimate Partner Violence: Not on file     ROS- All systems are reviewed and  negative except as per the HPI above.  Physical Exam: Vitals:   05/03/23 1014  BP: 130/72  Pulse: 60  Weight: 39.4 kg  Height: 4\' 11"  (1.499 m)     GEN- The patient is a well appearing elderly female, alert and oriented x 3 today.   HEENT-head normocephalic, atraumatic, sclera clear, conjunctiva pink, hearing intact, trachea midline. Lungs- Clear to ausculation bilaterally, normal work of breathing Heart- Regular rate and rhythm, no murmurs, rubs or gallops  GI- soft, NT, ND, + BS Extremities- no clubbing, cyanosis, or edema MS- no significant deformity or atrophy Skin- no rash or lesion Psych- euthymic mood, full affect Neuro- strength and sensation are intact   Wt Readings from Last 3 Encounters:  05/03/23 39.4 kg  04/16/23 40 kg  04/05/23 43 kg    EKG today demonstrates  SR Vent. rate 60 BPM PR interval 164 ms QRS duration 86 ms QT/QTcB 436/436 ms  Echo 03/03/23 demonstrated 1. Left ventricular ejection fraction, by estimation, is 55 to 60%. The  left ventricle has normal function. The left ventricle has no regional  wall motion abnormalities. Left ventricular diastolic parameters are  indeterminate.   2. Right ventricular  systolic function is normal. The right ventricular  size is normal. There is mildly elevated pulmonary artery systolic  pressure. The estimated right ventricular systolic pressure is 41.2 mmHg.   3. Left atrial size was severely dilated.   4. Right atrial size was severely dilated.   5. Mild mitral valve regurgitation.   6. Tricuspid valve regurgitation is moderate.   7. The aortic valve was not well visualized. There is moderate  calcification of the aortic valve. Aortic valve regurgitation is moderate.   8. The inferior vena cava is normal in size with <50% respiratory  variability, suggesting right atrial pressure of 8 mmHg.   Epic records are reviewed at length today.  CHA2DS2-VASc Score = 6  The patient's score is based upon: CHF History: 1 HTN History: 1 Diabetes History: 0 Stroke History: 0 Vascular Disease History: 1 (coronary calcification/aortic atherosclerosis) Age Score: 2 Gender Score: 1       ASSESSMENT AND PLAN: 1. Persistent Atrial Fibrillation (ICD10:  I48.19) The patient's CHA2DS2-VASc score is 6, indicating a 9.7% annual risk of stroke.   S/p dofetilide loading 4/5-03/25/23 Patient appears to be maintaining SR.  Continue dofetilide 125 mcg BID. QT stable. Check bmet/mag today. She ran out of magnesium yesterday and has not been taking KCL for 3 weeks. Will resume supplements based on results today.  Continue Eliquis 2.5 mg BID Continue Toprol 100 mg daily  2. Secondary Hypercoagulable State (ICD10:  D68.69) The patient is at significant risk for stroke/thromboembolism based upon her CHA2DS2-VASc Score of 6.  Continue Apixaban (Eliquis).   3. Chronic HFpEF Fluid status appears stable. Patient scheduled for ILR explant 6/21, does not want to participate in Alleviate HF study.  4. HTN Stable, no changes today. Home BP readings have improved with amlodipine.    Follow up with [name] and Dr Jens Som as scheduled. AF clinic 2-3 months after Dr Jens Som  for dofetilide monitoring.    Jorja Loa PA-C Afib Clinic [facility] [address] [place], [place] [address] [phone] 05/03/2023 11:06 AM

## 2023-05-09 ENCOUNTER — Ambulatory Visit (HOSPITAL_COMMUNITY)
Admission: RE | Admit: 2023-05-09 | Discharge: 2023-05-09 | Disposition: A | Discharge status: Home / Self Care | Payer: Medicare PPO | Source: Ambulatory Visit | Attending: Physician Assistant | Admitting: Physician Assistant

## 2023-05-09 DIAGNOSIS — I4891 Unspecified atrial fibrillation: Secondary | ICD-10-CM | POA: Diagnosis not present

## 2023-05-09 LAB — BASIC METABOLIC PANEL
Anion gap: 8 (ref 5–15)
BUN: 14 mg/dL (ref 8–23)
CO2: 30 mmol/L (ref 22–32)
Calcium: 9.1 mg/dL (ref 8.9–10.3)
Chloride: 97 mmol/L — ABNORMAL LOW (ref 98–111)
Creatinine, Ser: 0.87 mg/dL (ref 0.44–1.00)
GFR, Estimated: >60 mL/min (ref >60)
Glucose, Bld: 104 mg/dL — ABNORMAL HIGH (ref 70–99)
Potassium: 4.3 mmol/L (ref 3.5–5.1)
Sodium: 135 mmol/L (ref 135–145)

## 2023-05-20 ENCOUNTER — Ambulatory Visit: Payer: Medicare PPO | Admitting: Nurse Practitioner

## 2023-06-06 DIAGNOSIS — M810 Age-related osteoporosis without current pathological fracture: Secondary | ICD-10-CM | POA: Diagnosis not present

## 2023-06-07 ENCOUNTER — Encounter: Payer: Self-pay | Admitting: Cardiology

## 2023-06-07 ENCOUNTER — Ambulatory Visit: Payer: Medicare PPO | Attending: Cardiology | Admitting: Cardiology

## 2023-06-07 VITALS — BP 120/64 | HR 64 | Ht 59.0 in | Wt 84.8 lb

## 2023-06-07 DIAGNOSIS — Z006 Encounter for examination for normal comparison and control in clinical research program: Secondary | ICD-10-CM | POA: Diagnosis not present

## 2023-06-07 DIAGNOSIS — I4819 Other persistent atrial fibrillation: Secondary | ICD-10-CM | POA: Diagnosis not present

## 2023-06-07 DIAGNOSIS — I5032 Chronic diastolic (congestive) heart failure: Secondary | ICD-10-CM | POA: Diagnosis not present

## 2023-06-07 NOTE — Progress Notes (Signed)
Electrophysiology Office Follow up Visit Note:    Date:  06/07/2023   ID:  Sarah Lewis, DOB [birth year], MRN [id number]  PCP:  [name], MD  [facility] Cardiologist:  Olga Millers, MD  [facility] Electrophysiologist:  [name], MD    Interval History:    Sarah Lewis is a 87 y.o. female who presents for a follow up visit.   She presents today to have her loop recorder removed.  She has a loop recorder as part of the alleviate heart failure trial.  Loop recorder was implanted March 25, 2023.  The patient has experienced anxiety surrounding the loop recorder being in place and regarding the monitoring of the device.  For these reason she presents to have it removed.         Past medical, surgical, social and family history were reviewed.  ROS:   Please see the history of present illness.    All other systems reviewed and are negative.  EKGs/Labs/Other Studies Reviewed:    The following studies were reviewed today:  EKG Interpretation  Date/Time:  Friday June 07 2023 13:32:58 EDT Ventricular Rate:  63 PR Interval:  180 QRS Duration: 90 QT Interval:  420 QTC Calculation: 429 R Axis:   -64 Text Interpretation: Normal sinus rhythm Left axis deviation Confirmed by Steffanie Dunn [phone] on 06/07/2023 1:53:29 PM     Physical Exam:    VS:  BP 120/64   Pulse 64   Ht 4\' 11"  (1.499 m)   Wt 84 lb 12.8 oz (38.5 kg)   SpO2 96%   BMI 17.13 kg/m     Wt Readings from Last 3 Encounters:  06/07/23 84 lb 12.8 oz (38.5 kg)  05/03/23 86 lb 12.8 oz (39.4 kg)  04/16/23 88 lb 3.2 oz (40 kg)     GEN:  Well nourished, well developed in no acute distress.  Thin CARDIAC: RRR, no murmurs, rubs, gallops RESPIRATORY:  Clear to auscultation without rales, wheezing or rhonchi       ASSESSMENT:    1. Persistent atrial fibrillation (HCC)   2. Chronic diastolic heart failure (HCC)   3. Research study patient    PLAN:    In order of problems  listed above:  #Persistent atrial fibrillation #High risk med monitoring-dofetilide On Tikosyn 125 mcg by mouth twice daily and Eliquis 2.5 mg by mouth twice daily. EKG today with acceptable QTc for ongoing Tikosyn use  Plan for loop recorder explant today.  Procedure reviewed including the risks and she wishes to proceed.  Follow-up 4 months with APP.   Signed, Steffanie Dunn, MD, [facility], [facility] 06/07/2023 1:54 PM    Electrophysiology [facility]  --------------------------  SURGEON:  Steffanie Dunn, MD    PREPROCEDURE DIAGNOSIS:  Atrial fibrillation and chronic diastolic heart failure     POSTPROCEDURE DIAGNOSIS: Same as preprocedure     PROCEDURES:   1. Implantable loop recorder explantation    INTRODUCTION:  Sarah Lewis is a 87 y.o. female with a history of atrial fibrillation and chronic diastolic heart failure who presents today for implantable loop recorder explant.  The patient has a loop recorder as part of the alleviate heart failure trial.  She has experienced anxiety about the monitoring and implantable device and presents today to have it removed.     DESCRIPTION OF PROCEDURE:  Informed written consent was obtained.  A preprocedural timeout was performed with Wenda Low, RN. The patient required no sedation for the procedure  today.   The patients left chest was therefore prepped and draped in the usual sterile fashion.  The skin overlying the ILR monitor was infiltrated with lidocaine for local analgesia.  A 0.5-cm incision was made over the site.  The previously implanted ILR was exposed and removed using a combination of sharp and blunt dissection.  Steri- Strips and a sterile dressing were then applied. EBL<45ml.  There were no early apparent complications.     CONCLUSIONS:   1. Successful explantation of a [employer] Reveal LINQ implantable loop recorder   2. No early apparent complications.        Steffanie Dunn, MD 06/07/2023 1:54  PM

## 2023-06-07 NOTE — Patient Instructions (Addendum)
Medication Instructions:  Your physician recommends that you continue on your current medications as directed. Please refer to the Current Medication list given to you today.  Labwork: None ordered.  Testing/Procedures: None ordered.  Follow-Up:  Your physician wants you to follow-up in: 4 months with an EP APP    Implantable Loop Recorder Removal, Care After This sheet gives you information about how to care for yourself after your procedure. Your health care provider may also give you more specific instructions. If you have problems or questions, contact your health care provider. What can I expect after the procedure? After the procedure, it is common to have: Soreness or discomfort near the incision. Some swelling or bruising near the incision.  Follow these instructions at home: Incision care  Monitor your cardiac device site for redness, swelling, and drainage. Call the device clinic at [phone] if you experience these symptoms or fever/chills.  Keep the large square bandage on your site for 24 hours and then you may remove it yourself. Keep the steri-strips underneath in place.   You may shower after 72 hours / 3 days from your procedure with the steri-strips in place. They will usually fall off on their own, or may be removed after 10 days. Pat dry.   Avoid lotions, ointments, or perfumes over your incision until it is well-healed.  Please do not submerge in water until your site is completely healed.   If your wound site starts to bleed apply pressure.       If you have any questions/concerns please call the device clinic at [phone].  Activity  Return to your normal activities.  Contact a health care provider if: You have redness, swelling, or pain around your incision. You have a fever.

## 2023-08-18 ENCOUNTER — Other Ambulatory Visit (HOSPITAL_COMMUNITY): Payer: Self-pay | Admitting: Physician Assistant

## 2023-09-02 DIAGNOSIS — Z23 Encounter for immunization: Secondary | ICD-10-CM | POA: Diagnosis not present

## 2023-09-02 DIAGNOSIS — J479 Bronchiectasis, uncomplicated: Secondary | ICD-10-CM | POA: Diagnosis not present

## 2023-09-19 NOTE — Progress Notes (Signed)
HPI: FU atrial fibrillation and hypertension. Chest CT February 2020 showed coronary calcification. Carotid Dopplers November 2021 showed 1 to 39% right and no stenosis on the left.  Patient was admitted with atrial fibrillation March 2024.  Echocardiogram March 2024 showed normal LV function, severe biatrial enlargement, mild mitral regurgitation, moderate tricuspid regurgitation, moderate aortic insufficiency.  Transesophageal echocardiogram March 2024 showed normal LV function, severe biatrial enlargement, mild mitral regurgitation, moderate tricuspid regurgitation, mild aortic insufficiency.  Had successful cardioversion at that time.  She then developed recurrent atrial fibrillation and was placed on dofetilide.  She converted spontaneously at that time.  Implantable loop recorder placed April 2024.  Since last seen she denies dyspnea, chest pain, palpitations or syncope.  No bleeding.  Current Outpatient Medications  Medication Sig Dispense Refill   apixaban (ELIQUIS) 2.5 MG TABS tablet Take 1 tablet (2.5 mg total) by mouth 2 (two) times daily. 60 tablet 2   Calcium Carb-Cholecalciferol (CALCIUM-VITAMIN D) 500-400 MG-UNIT TABS Take 1 tablet by mouth 2 (two) times daily.     Cholecalciferol (VITAMIN D-3) 25 MCG (1000 UT) CAPS Take 1 capsule by mouth daily.     denosumab (PROLIA) 60 MG/ML SOSY injection Inject 60 mg into the skin every 6 (six) months.     dofetilide (TIKOSYN) 125 MCG capsule Take 1 capsule by mouth twice daily 60 capsule 6   furosemide (LASIX) 20 MG tablet Take 1 tablet (20 mg total) by mouth daily. 90 tablet 3   ipratropium (ATROVENT) 0.02 % nebulizer solution TAKE 2.5 MLS BY NEBULIZATION EVERY 6 (SIX) HOURS AS NEEDED FOR WHEEZING OR SHORTNESS OF BREATH. 125 mL 6   JARDIANCE 10 MG TABS tablet Take 10 mg by mouth daily.     magnesium oxide (MAG-OX) 400 (240 Mg) MG tablet Take 0.5 tablets (200 mg total) by mouth daily. 45 tablet 3   metoprolol succinate (TOPROL-XL) 100 MG 24  hr tablet Take 1.5 tablets (150 mg total) by mouth daily. Take with or immediately following a meal. 135 tablet 3   potassium chloride (KLOR-CON) 10 MEQ tablet Take 1 tablet (10 mEq total) by mouth daily. 90 tablet 3   albuterol (PROVENTIL) (2.5 MG/3ML) 0.083% nebulizer solution Take 3 mLs (2.5 mg total) by nebulization every 6 (six) hours as needed for wheezing or shortness of breath. 75 mL 12   No current facility-administered medications for this visit.     Past Medical History:  Diagnosis Date   Anemia yrs ago   Aortic insufficiency    Breast cancer of lower-outer quadrant of right female breast (HCC) 10/10/2015   left breat also chemo done for first   Cough    from toporol   Heart murmur    Hypertension    Osteoporosis    Spinal headache yrs ago    Past Surgical History:  Procedure Laterality Date   ABDOMINAL HYSTERECTOMY     total   APPENDECTOMY     BREAST RECONSTRUCTION WITH PLACEMENT OF TISSUE EXPANDER AND FLEX HD (ACELLULAR HYDRATED DERMIS) Right 12/13/2015   Procedure: RIGHT BREAST RECONSTRUCTION WITH PLACEMENT OF silicone gel implant and allograft tissue;  Surgeon: [name], MD;  Location: [facility] OR;  Service: Plastics;  Laterality: Right;   BREAST SURGERY     CARDIOVERSION N/A 03/05/2023   Procedure: CARDIOVERSION;  Surgeon: [name], MD;  Location: [facility] ENDOSCOPY;  Service: Cardiovascular;  Laterality: N/A;   CESAREAN SECTION     x 2   COLONOSCOPY WITH PROPOFOL N/A 04/08/2017  Procedure: COLONOSCOPY WITH PROPOFOL;  Surgeon: [name], MD;  Location: [facility] ENDOSCOPY;  Service: Endoscopy;  Laterality: N/A;   HERNIA REPAIR     LOOP RECORDER INSERTION N/A 03/25/2023   Procedure: LOOP RECORDER INSERTION;  Surgeon: [name], MD;  Location: [facility] INVASIVE CV LAB;  Service: Cardiovascular;  Laterality: N/A;   MASTECTOMY Left 1979   TEE WITHOUT CARDIOVERSION N/A 03/05/2023   Procedure: TRANSESOPHAGEAL ECHOCARDIOGRAM (TEE);  Surgeon: [name], MD;   Location: [facility] ENDOSCOPY;  Service: Cardiovascular;  Laterality: N/A;   TONSILLECTOMY     TOTAL MASTECTOMY Right 12/13/2015   Procedure: RIGHT MASTECTOMY ;  Surgeon: [name], MD;  Location: [facility] OR;  Service: General;  Laterality: Right;    Social History   Socioeconomic History   Marital status: Married    Spouse name: Not on file   Number of children: 2   Years of education: Not on file   Highest education level: Not on file  Occupational History   Not on file  Tobacco Use   Smoking status: Never   Smokeless tobacco: Never   Tobacco comments:    Never smoke 04/02/23  Vaping Use   Vaping status: Never Used  Substance and Sexual Activity   Alcohol use: No   Drug use: No   Sexual activity: Not on file  Other Topics Concern   Not on file  Social History Narrative   Not on file   Social Determinants of Health   Financial Resource Strain: Not on file  Food Insecurity: Not on file  Transportation Needs: Not on file  Physical Activity: Not on file  Stress: Not on file  Social Connections: Not on file  Intimate Partner Violence: Not on file    Family History  Problem Relation Age of Onset   Other Mother        malignant neoplasm   Bone cancer Father        malignant neoplasm   Other Sister        malignant neoplasm    ROS: no fevers or chills, productive cough, hemoptysis, dysphasia, odynophagia, melena, hematochezia, dysuria, hematuria, rash, seizure activity, orthopnea, PND, pedal edema, claudication. Remaining systems are negative.  Physical Exam: Well-developed well-nourished in no acute distress.  Skin is warm and dry.  HEENT is normal.  Neck is supple.  Chest is clear to auscultation with normal expansion.  Cardiovascular exam is regular rate and rhythm.  Abdominal exam nontender or distended. No masses palpated. Extremities show no edema. neuro grossly intact   A/P  1 paroxysmal atrial fibrillation-patient remains in sinus rhythm.  Will continue  dofetilide, Toprol and apixaban.  2 valvular heart disease-recent transesophageal echocardiogram showed mild mitral and aortic insufficiency and moderate tricuspid regurgitation.  Will plan follow-up echocardiogram March 2025.  3 coronary calcification-she is not having chest pain.  4 hypertension-patient's blood pressure is controlled.  Continue present medications.  5 chronic diastolic congestive heart failure-patient is euvolemic today.  Sarah Millers, MD

## 2023-09-21 NOTE — Progress Notes (Unsigned)
Cardiology Office Note:  .   Date:  09/21/2023  ID:  Sarah Lewis, DOB [birth year], MRN [id number] PCP: [name], MD  [facility] Providers Cardiologist:  Olga Millers, MD Electrophysiologist:  [name], MD {  History of Present Illness: .   Sarah Lewis is a 87 y.o. female w/PMHx of breast cancer (b/l mastectomy/chemo), coronary Ca++ by CT, HTN, p.HTN, bronchiectasis, AFib   She saw Dr. Lalla Brothers 06/07/23, loop removed given patient anxiety about having the device, monitoring, etc.  QTc stable  Today's visit is scheduled as 3-4 mo Tikosyn visit  ROS: ***  *** Loop extraction site *** symptoms *** eliquis, dose, bleeding'*** tikosyn teaching, labs, meds, EKG  Device information MDT linq implanted 03/25/23: as part of the Alleviate trial >> more anxiety provoking for the patient and requested removal >> extracted 06/07/23  Arrhythmia/AAD hx Tikosyn started march 2024  Studies Reviewed: [name] Kitchen    EKG done today and reviewed by myself:  ***   03/05/23: TEE/DCCV Impression: No LAA thrombus Severe biatrial enlargement Mild MR, moderate TR, mild AI LVEF 60-65% Successful DCCV with a single 150J biphasic shock to NSR.     03/03/23; TTE 1. Left ventricular ejection fraction, by estimation, is 55 to 60%. The  left ventricle has normal function. The left ventricle has no regional  wall motion abnormalities. Left ventricular diastolic parameters are  indeterminate.   2. Right ventricular systolic function is normal. The right ventricular  size is normal. There is mildly elevated pulmonary artery systolic  pressure. The estimated right ventricular systolic pressure is 41.2 mmHg.   3. Left atrial size was severely dilated.   4. Right atrial size was severely dilated.   5. Mild mitral valve regurgitation.   6. Tricuspid valve regurgitation is moderate.   7. The aortic valve was not well visualized. There is moderate  calcification of the aortic valve.  Aortic valve regurgitation is moderate.   8. The inferior vena cava is normal in size with <50% respiratory  variability, suggesting right atrial pressure of 8 mmHg.    04/30/22: TTE 1. Left ventricular ejection fraction, by estimation, is 55 to 60%. The  left ventricle has normal function. The left ventricle has no regional  wall motion abnormalities. Left ventricular diastolic parameters are  consistent with Grade II diastolic  dysfunction (pseudonormalization).   2. Right ventricular systolic function is normal. The right ventricular  size is moderately enlarged. There is mildly elevated pulmonary artery  systolic pressure. The estimated right ventricular systolic pressure is  41.4 mmHg.   3. Left atrial size was mildly dilated.   4. Right atrial size was mildly dilated.   5. The mitral valve is grossly normal. Mild mitral valve regurgitation.  No evidence of mitral stenosis.   6. The tricuspid valve is abnormal. Tricuspid valve regurgitation is  moderate to severe.   7. The aortic valve is calcified. Aortic valve regurgitation is moderate.   8. The inferior vena cava is normal in size with greater than 50%  respiratory variability, suggesting right atrial pressure of 3 mmHg.   Comparison(s): Changes from prior study are noted. EF unchanged. AI is now  moderate. TR still moderate to severe.    Risk Assessment/Calculations:    Physical Exam:   VS:  There were no vitals taken for this visit.   Wt Readings from Last 3 Encounters:  06/07/23 84 lb 12.8 oz (38.5 kg)  05/03/23 86 lb 12.8 oz (39.4 kg)  04/16/23 88 lb  3.2 oz (40 kg)    GEN: Well nourished, well developed in no acute distress NECK: No JVD; No carotid bruits CARDIAC: ***RRR, no murmurs, rubs, gallops RESPIRATORY:  *** CTA b/l without rales, wheezing or rhonchi  ABDOMEN: Soft, non-tender, non-distended EXTREMITIES:  No edema; No deformity   ILR removal site: *** is stable, no thinning, fluctuation,  tethering  ASSESSMENT AND PLAN: .    Persistent  AFib CHA2DS2Vasc is 3, on eliquis, *** appropriately dosed Tikosyn w/*** QTc *** teaching re-enforced *** med list reviewed ***  Secondary hypercoagulable state 2/2 AFib     {Are you ordering a CV Procedure (e.g. stress test, cath, DCCV, TEE, etc)?   Press F2        :[id number]}     Dispo: ***  Signed, Sheilah Pigeon, PA-C

## 2023-09-23 ENCOUNTER — Encounter: Payer: Self-pay | Admitting: Physician Assistant

## 2023-09-23 ENCOUNTER — Ambulatory Visit: Payer: Medicare PPO | Attending: Physician Assistant | Admitting: Physician Assistant

## 2023-09-23 VITALS — BP 108/64 | HR 58 | Ht 59.0 in | Wt 83.2 lb

## 2023-09-23 DIAGNOSIS — I10 Essential (primary) hypertension: Secondary | ICD-10-CM

## 2023-09-23 DIAGNOSIS — Z5181 Encounter for therapeutic drug level monitoring: Secondary | ICD-10-CM | POA: Diagnosis not present

## 2023-09-23 DIAGNOSIS — D6869 Other thrombophilia: Secondary | ICD-10-CM | POA: Diagnosis not present

## 2023-09-23 DIAGNOSIS — Z79899 Other long term (current) drug therapy: Secondary | ICD-10-CM | POA: Diagnosis not present

## 2023-09-23 DIAGNOSIS — I4819 Other persistent atrial fibrillation: Secondary | ICD-10-CM

## 2023-09-23 NOTE — Patient Instructions (Signed)
Medication Instructions:   STOP TAKING:  AMLODIPINE   *If you need a refill on your cardiac medications before your next appointment, please call your pharmacy*   Lab Work:  BMET AND MAG TODAY     If you have labs (blood work) drawn today and your tests are completely normal, you will receive your results only by: MyChart Message (if you have MyChart) OR A paper copy in the mail If you have any lab test that is abnormal or we need to change your treatment, we will call you to review the results.   Testing/Procedures: NONE ORDERED  TODAY    Follow-Up: At [facility], you and your health needs are our priority.  As part of our continuing mission to provide you with exceptional heart care, we have created designated Provider Care Teams.  These Care Teams include your primary Cardiologist (physician) and Advanced Practice Providers (APPs -  Physician Assistants and Nurse Practitioners) who all work together to provide you with the care you need, when you need it.  We recommend signing up for the patient portal called "MyChart".  Sign up information is provided on this After Visit Summary.  MyChart is used to connect with patients for Virtual Visits (Telemedicine).  Patients are able to view lab/test results, encounter notes, upcoming appointments, etc.  Non-urgent messages can be sent to your provider as well.   To learn more about what you can do with MyChart, go to [email or web address].    Your next appointment:    4  MONTHS   Provider:    You may see [name], MD or one of the following Advanced Practice Providers on your designated Care Team:   Francis Dowse, [place]    Other Instructions

## 2023-09-24 LAB — BASIC METABOLIC PANEL
BUN/Creatinine Ratio: 14 (ref 12–28)
BUN: 15 mg/dL (ref 8–27)
CO2: 28 mmol/L (ref 20–29)
Calcium: 10 mg/dL (ref 8.7–10.3)
Chloride: 98 mmol/L (ref 96–106)
Creatinine, Ser: 1.08 mg/dL — ABNORMAL HIGH (ref 0.57–1.00)
Glucose: 100 mg/dL — ABNORMAL HIGH (ref 70–99)
Potassium: 4.2 mmol/L (ref 3.5–5.2)
Sodium: 141 mmol/L (ref 134–144)
eGFR: 51 mL/min/{1.73_m2} — ABNORMAL LOW (ref >59)

## 2023-09-24 LAB — MAGNESIUM: Magnesium: 2.2 mg/dL (ref 1.6–2.3)

## 2023-09-25 ENCOUNTER — Other Ambulatory Visit: Payer: Self-pay | Admitting: *Deleted

## 2023-09-25 DIAGNOSIS — Z79899 Other long term (current) drug therapy: Secondary | ICD-10-CM

## 2023-09-25 MED ORDER — MAGNESIUM OXIDE -MG SUPPLEMENT 400 (240 MG) MG PO TABS: 200.0000 mg | ORAL_TABLET | Freq: Every day | ORAL | 3 refills | Status: DC

## 2023-10-03 ENCOUNTER — Ambulatory Visit: Payer: Medicare PPO | Attending: Cardiology | Admitting: Cardiology

## 2023-10-03 ENCOUNTER — Encounter: Payer: Self-pay | Admitting: Cardiology

## 2023-10-03 VITALS — BP 98/50 | HR 63 | Ht 59.0 in | Wt 83.0 lb

## 2023-10-03 DIAGNOSIS — I5032 Chronic diastolic (congestive) heart failure: Secondary | ICD-10-CM | POA: Diagnosis not present

## 2023-10-03 DIAGNOSIS — I351 Nonrheumatic aortic (valve) insufficiency: Secondary | ICD-10-CM

## 2023-10-03 DIAGNOSIS — I480 Paroxysmal atrial fibrillation: Secondary | ICD-10-CM | POA: Diagnosis not present

## 2023-10-03 DIAGNOSIS — I10 Essential (primary) hypertension: Secondary | ICD-10-CM | POA: Diagnosis not present

## 2023-10-03 NOTE — Patient Instructions (Signed)
Medication Instructions:  No change  *If you need a refill on your cardiac medications before your next appointment, please call your pharmacy*   Lab Work: None  If you have labs (blood work) drawn today and your tests are completely normal, you will receive your results only by: MyChart Message (if you have MyChart) OR A paper copy in the mail If you have any lab test that is abnormal or we need to change your treatment, we will call you to review the results.   Testing/Procedures: None   Follow-Up: At [facility], you and your health needs are our priority.  As part of our continuing mission to provide you with exceptional heart care, we have created designated Provider Care Teams.  These Care Teams include your primary Cardiologist (physician) and Advanced Practice Providers (APPs -  Physician Assistants and Nurse Practitioners) who all work together to provide you with the care you need, when you need it.  We recommend signing up for the patient portal called "MyChart".  Sign up information is provided on this After Visit Summary.  MyChart is used to connect with patients for Virtual Visits (Telemedicine).  Patients are able to view lab/test results, encounter notes, upcoming appointments, etc.  Non-urgent messages can be sent to your provider as well.   To learn more about what you can do with MyChart, go to [email or web address].    Your next appointment:   6 month(s)  Provider:   Olga Millers, MD

## 2023-10-08 DIAGNOSIS — R739 Hyperglycemia, unspecified: Secondary | ICD-10-CM | POA: Diagnosis not present

## 2023-10-08 DIAGNOSIS — I10 Essential (primary) hypertension: Secondary | ICD-10-CM | POA: Diagnosis not present

## 2023-10-08 DIAGNOSIS — J449 Chronic obstructive pulmonary disease, unspecified: Secondary | ICD-10-CM | POA: Diagnosis not present

## 2023-10-08 DIAGNOSIS — M810 Age-related osteoporosis without current pathological fracture: Secondary | ICD-10-CM | POA: Diagnosis not present

## 2023-10-08 DIAGNOSIS — I4891 Unspecified atrial fibrillation: Secondary | ICD-10-CM | POA: Diagnosis not present

## 2023-10-08 DIAGNOSIS — R634 Abnormal weight loss: Secondary | ICD-10-CM | POA: Diagnosis not present

## 2023-10-08 DIAGNOSIS — Z853 Personal history of malignant neoplasm of breast: Secondary | ICD-10-CM | POA: Diagnosis not present

## 2023-10-08 DIAGNOSIS — D6869 Other thrombophilia: Secondary | ICD-10-CM | POA: Diagnosis not present

## 2023-10-08 DIAGNOSIS — I5032 Chronic diastolic (congestive) heart failure: Secondary | ICD-10-CM | POA: Diagnosis not present

## 2023-10-09 ENCOUNTER — Ambulatory Visit: Payer: Medicare PPO | Attending: Cardiology

## 2023-10-09 DIAGNOSIS — Z79899 Other long term (current) drug therapy: Secondary | ICD-10-CM | POA: Diagnosis not present

## 2023-10-10 LAB — BASIC METABOLIC PANEL
BUN/Creatinine Ratio: 15 (ref 12–28)
BUN: 13 mg/dL (ref 8–27)
CO2: 29 mmol/L (ref 20–29)
Calcium: 9.1 mg/dL (ref 8.7–10.3)
Chloride: 95 mmol/L — ABNORMAL LOW (ref 96–106)
Creatinine, Ser: 0.86 mg/dL (ref 0.57–1.00)
Glucose: 134 mg/dL — ABNORMAL HIGH (ref 70–99)
Potassium: 3.9 mmol/L (ref 3.5–5.2)
Sodium: 139 mmol/L (ref 134–144)
eGFR: 67 mL/min/{1.73_m2} (ref >59)

## 2023-10-10 LAB — MAGNESIUM: Magnesium: 1.9 mg/dL (ref 1.6–2.3)

## 2023-10-11 ENCOUNTER — Telehealth: Payer: Self-pay | Admitting: Cardiology

## 2023-10-11 NOTE — Telephone Encounter (Signed)
Spoke to patient.and lab results below relayed.        Stable labs, continue on the lower dose of magnesium.  Hopefully that helps her GI symptoms.

## 2023-10-11 NOTE — Telephone Encounter (Signed)
Patient returned RN's call regarding test results.

## 2023-10-17 ENCOUNTER — Encounter: Payer: Self-pay | Admitting: *Deleted

## 2023-11-04 ENCOUNTER — Other Ambulatory Visit: Payer: Self-pay | Admitting: Pulmonary Disease

## 2023-11-04 ENCOUNTER — Other Ambulatory Visit: Payer: Self-pay | Admitting: Cardiology

## 2023-11-27 DIAGNOSIS — M810 Age-related osteoporosis without current pathological fracture: Secondary | ICD-10-CM | POA: Diagnosis not present

## 2023-11-27 DIAGNOSIS — E559 Vitamin D deficiency, unspecified: Secondary | ICD-10-CM | POA: Diagnosis not present

## 2023-12-04 DIAGNOSIS — M1990 Unspecified osteoarthritis, unspecified site: Secondary | ICD-10-CM | POA: Diagnosis not present

## 2023-12-04 DIAGNOSIS — M0579 Rheumatoid arthritis with rheumatoid factor of multiple sites without organ or systems involvement: Secondary | ICD-10-CM | POA: Diagnosis not present

## 2023-12-04 DIAGNOSIS — I10 Essential (primary) hypertension: Secondary | ICD-10-CM | POA: Diagnosis not present

## 2023-12-04 DIAGNOSIS — E559 Vitamin D deficiency, unspecified: Secondary | ICD-10-CM | POA: Diagnosis not present

## 2023-12-04 DIAGNOSIS — M810 Age-related osteoporosis without current pathological fracture: Secondary | ICD-10-CM | POA: Diagnosis not present

## 2023-12-05 ENCOUNTER — Ambulatory Visit (HOSPITAL_COMMUNITY)
Admission: RE | Admit: 2023-12-05 | Discharge: 2023-12-05 | Disposition: A | Discharge status: Home / Self Care | Payer: Medicare PPO | Source: Ambulatory Visit | Attending: Physician Assistant | Admitting: Physician Assistant

## 2023-12-05 ENCOUNTER — Encounter (HOSPITAL_COMMUNITY): Payer: Self-pay | Admitting: Physician Assistant

## 2023-12-05 VITALS — BP 140/84 | HR 62 | Ht 59.0 in | Wt 85.4 lb

## 2023-12-05 DIAGNOSIS — I4819 Other persistent atrial fibrillation: Secondary | ICD-10-CM | POA: Insufficient documentation

## 2023-12-05 DIAGNOSIS — Z79899 Other long term (current) drug therapy: Secondary | ICD-10-CM | POA: Diagnosis not present

## 2023-12-05 DIAGNOSIS — Z7901 Long term (current) use of anticoagulants: Secondary | ICD-10-CM | POA: Insufficient documentation

## 2023-12-05 DIAGNOSIS — I2510 Atherosclerotic heart disease of native coronary artery without angina pectoris: Secondary | ICD-10-CM | POA: Insufficient documentation

## 2023-12-05 DIAGNOSIS — I10 Essential (primary) hypertension: Secondary | ICD-10-CM | POA: Diagnosis present

## 2023-12-05 DIAGNOSIS — I4891 Unspecified atrial fibrillation: Secondary | ICD-10-CM | POA: Diagnosis present

## 2023-12-05 DIAGNOSIS — I110 Hypertensive heart disease with heart failure: Secondary | ICD-10-CM | POA: Insufficient documentation

## 2023-12-05 DIAGNOSIS — Z5181 Encounter for therapeutic drug level monitoring: Secondary | ICD-10-CM | POA: Diagnosis not present

## 2023-12-05 DIAGNOSIS — I272 Pulmonary hypertension, unspecified: Secondary | ICD-10-CM | POA: Diagnosis not present

## 2023-12-05 DIAGNOSIS — I5032 Chronic diastolic (congestive) heart failure: Secondary | ICD-10-CM | POA: Diagnosis not present

## 2023-12-05 DIAGNOSIS — D6869 Other thrombophilia: Secondary | ICD-10-CM | POA: Insufficient documentation

## 2023-12-05 DIAGNOSIS — Z853 Personal history of malignant neoplasm of breast: Secondary | ICD-10-CM | POA: Diagnosis not present

## 2023-12-05 DIAGNOSIS — R9431 Abnormal electrocardiogram [ECG] [EKG]: Secondary | ICD-10-CM | POA: Insufficient documentation

## 2023-12-05 LAB — BASIC METABOLIC PANEL
Anion gap: 9 (ref 5–15)
BUN: 15 mg/dL (ref 8–23)
CO2: 26 mmol/L (ref 22–32)
Calcium: 9.2 mg/dL (ref 8.9–10.3)
Chloride: 101 mmol/L (ref 98–111)
Creatinine, Ser: 0.87 mg/dL (ref 0.44–1.00)
GFR, Estimated: >60 mL/min (ref >60)
Glucose, Bld: 63 mg/dL — ABNORMAL LOW (ref 70–99)
Potassium: 4.1 mmol/L (ref 3.5–5.1)
Sodium: 136 mmol/L (ref 135–145)

## 2023-12-05 LAB — MAGNESIUM: Magnesium: 2.1 mg/dL (ref 1.7–2.4)

## 2023-12-05 NOTE — Progress Notes (Signed)
Primary Care Physician: [name], MD Primary Cardiologist: Dr Jens Som Primary Electrophysiologist: [name] Referring Physician: [name] is a 87 y.o. female with a history of breast cancer, CAD, HTN, pulmonary HTN, bronchiectasis, atrial fibrillation who presents for follow up in the [facility]. Patient was admitted to [facility] 03/02/23 - 03/06/23 for new onset atrial fibrillation with RVR.  During that visit the patient underwent a TEE cardioversion which was successful.  Since discharge, the patient had been doing well until on Easter Sunday she developed repeat symptoms of SOB/DOE.  She was hopeful that her symptoms will subside; however, they persisted prompting her to come into the ED on 03/20/23. She was again in afib with RVR with acute HFpEF. EP was consulted and she was loaded on dofetilide. She converted with the medication and did not require another DCCV. Patient is on Eliquis for a CHADS2VASC score of 6. ILR was explanted 05/2023 per patient preference.   On follow up today, patient reports that she has done well since her last visit. She denies any interim symptoms of afib. No bleeding issues on anticoagulation.   Today, she denies symptoms of palpitations, chest pain, orthopnea, PND, dizziness, presyncope, syncope, snoring, daytime somnolence, bleeding, or neurologic sequela. The patient is tolerating medications without difficulties and is otherwise without complaint today.    Atrial Fibrillation Risk Factors:  she does not have symptoms or diagnosis of sleep apnea. she does not have a history of rheumatic fever.   Atrial Fibrillation Management history:  Previous antiarrhythmic drugs: dofetilide  Previous cardioversions: 03/05/23 Previous ablations: none Anticoagulation history: Eliquis   Past Medical History:  Diagnosis Date   Anemia yrs ago   Aortic insufficiency    Breast cancer of lower-outer quadrant of right  female breast (HCC) 10/10/2015   left breat also chemo done for first   Cough    from toporol   Heart murmur    Hypertension    Osteoporosis    Spinal headache yrs ago    Current Outpatient Medications  Medication Sig Dispense Refill   albuterol (PROVENTIL) (2.5 MG/3ML) 0.083% nebulizer solution Take 3 mLs (2.5 mg total) by nebulization every 6 (six) hours as needed for wheezing or shortness of breath. 75 mL 12   apixaban (ELIQUIS) 2.5 MG TABS tablet Take 1 tablet (2.5 mg total) by mouth 2 (two) times daily. 60 tablet 2   Calcium Carb-Cholecalciferol (CALCIUM-VITAMIN D) 500-400 MG-UNIT TABS Take 1 tablet by mouth 2 (two) times daily.     Cholecalciferol (VITAMIN D-3) 25 MCG (1000 UT) CAPS Take 1 capsule by mouth daily.     denosumab (PROLIA) 60 MG/ML SOSY injection Inject 60 mg into the skin every 6 (six) months.     dofetilide (TIKOSYN) 125 MCG capsule Take 1 capsule by mouth twice daily 60 capsule 6   furosemide (LASIX) 20 MG tablet Take 1 tablet (20 mg total) by mouth daily. 90 tablet 3   ipratropium (ATROVENT) 0.02 % nebulizer solution TAKE 2.5 MLS BY NEBULIZATION EVERY 6 (SIX) HOURS AS NEEDED FOR WHEEZING OR SHORTNESS OF BREATH. 125 mL 6   JARDIANCE 10 MG TABS tablet Take 10 mg by mouth daily.     magnesium oxide (MAG-OX) 400 (240 Mg) MG tablet Take 0.5 tablets (200 mg total) by mouth daily. 45 tablet 3   metoprolol succinate (TOPROL-XL) 100 MG 24 hr tablet Take 1.5 tablets (150 mg total) by mouth daily. Take with or immediately following a  meal. 135 tablet 3   potassium chloride (KLOR-CON) 10 MEQ tablet Take 1 tablet (10 mEq total) by mouth daily. 90 tablet 3   No current facility-administered medications for this encounter.    ROS- All systems are reviewed and negative except as per the HPI above.  Physical Exam: Vitals:   12/05/23 1053  BP: (!) 140/84  Pulse: 62  Weight: 38.7 kg  Height: 4\' 11"  (1.499 m)     GEN: Well nourished, well developed in no acute  distress NECK: No JVD; No carotid bruits CARDIAC: Regular rate and rhythm, no murmurs, rubs, gallops RESPIRATORY:  Clear to auscultation without rales, wheezing or rhonchi  ABDOMEN: Soft, non-tender, non-distended EXTREMITIES:  No edema; No deformity    Wt Readings from Last 3 Encounters:  12/05/23 38.7 kg  10/03/23 37.6 kg  09/23/23 37.7 kg    EKG today demonstrates  SR Vent. rate 62 BPM PR interval 172 ms QRS duration 90 ms QT/QTcB 430/436 ms   Echo 03/03/23 demonstrated 1. Left ventricular ejection fraction, by estimation, is 55 to 60%. The  left ventricle has normal function. The left ventricle has no regional  wall motion abnormalities. Left ventricular diastolic parameters are  indeterminate.   2. Right ventricular systolic function is normal. The right ventricular  size is normal. There is mildly elevated pulmonary artery systolic  pressure. The estimated right ventricular systolic pressure is 41.2 mmHg.   3. Left atrial size was severely dilated.   4. Right atrial size was severely dilated.   5. Mild mitral valve regurgitation.   6. Tricuspid valve regurgitation is moderate.   7. The aortic valve was not well visualized. There is moderate  calcification of the aortic valve. Aortic valve regurgitation is moderate.   8. The inferior vena cava is normal in size with <50% respiratory  variability, suggesting right atrial pressure of 8 mmHg.   Epic records are reviewed at length today.  CHA2DS2-VASc Score = 6  The patient's score is based upon: CHF History: 1 HTN History: 1 Diabetes History: 0 Stroke History: 0 Vascular Disease History: 1 (coronary calcification/aortic atherosclerosis) Age Score: 2 Gender Score: 1       ASSESSMENT AND PLAN: Persistent Atrial Fibrillation (ICD10:  I48.19) The patient's CHA2DS2-VASc score is 6, indicating a 9.7% annual risk of stroke.   S/p dofetilide loading 4/5-03/25/23 Patient appears to be maintaining SR Continue dofetilide  125 mcg BID, QT stable Check bmet/mag today Continue Eliquis 2.5 mg BID Continue Toprol 150 mg daily  Secondary Hypercoagulable State (ICD10:  D68.69) The patient is at significant risk for stroke/thromboembolism based upon her CHA2DS2-VASc Score of 6.  Continue Apixaban (Eliquis).   Chronic HFpEF Fluid status appears stable  HTN Stable on current regimen   Follow up with Francis Dowse in 6 months.    Jorja Loa PA-C [facility] [address] [place], [place] [address] [phone] 12/05/2023 11:02 AM

## 2023-12-09 DIAGNOSIS — M810 Age-related osteoporosis without current pathological fracture: Secondary | ICD-10-CM | POA: Diagnosis not present

## 2024-01-07 ENCOUNTER — Other Ambulatory Visit: Payer: Self-pay | Admitting: Cardiology

## 2024-01-07 ENCOUNTER — Other Ambulatory Visit (HOSPITAL_COMMUNITY): Payer: Self-pay | Admitting: Physician Assistant

## 2024-01-07 DIAGNOSIS — Z006 Encounter for examination for normal comparison and control in clinical research program: Secondary | ICD-10-CM

## 2024-01-20 ENCOUNTER — Other Ambulatory Visit: Payer: Self-pay | Admitting: *Deleted

## 2024-01-20 DIAGNOSIS — I340 Nonrheumatic mitral (valve) insufficiency: Secondary | ICD-10-CM

## 2024-01-20 DIAGNOSIS — I071 Rheumatic tricuspid insufficiency: Secondary | ICD-10-CM

## 2024-01-20 DIAGNOSIS — I351 Nonrheumatic aortic (valve) insufficiency: Secondary | ICD-10-CM

## 2024-02-03 ENCOUNTER — Encounter: Payer: Medicare PPO | Admitting: Physician Assistant

## 2024-02-05 NOTE — Research (Signed)
Subject no longer wishes to participate in the Alleviate Research Study. Will address with MD.

## 2024-02-05 NOTE — Research (Signed)
Loop recorder removed for the Alleviate HF Research Study per pt request.

## 2024-02-17 ENCOUNTER — Ambulatory Visit (HOSPITAL_COMMUNITY): Payer: Medicare PPO | Attending: Cardiology

## 2024-02-17 DIAGNOSIS — I071 Rheumatic tricuspid insufficiency: Secondary | ICD-10-CM | POA: Diagnosis not present

## 2024-02-17 DIAGNOSIS — I340 Nonrheumatic mitral (valve) insufficiency: Secondary | ICD-10-CM | POA: Insufficient documentation

## 2024-02-17 DIAGNOSIS — I351 Nonrheumatic aortic (valve) insufficiency: Secondary | ICD-10-CM | POA: Insufficient documentation

## 2024-02-17 LAB — ECHOCARDIOGRAM COMPLETE
Area-P 1/2: 3.45 cm2
P 1/2 time: 553 ms
S' Lateral: 1.7 cm

## 2024-02-18 ENCOUNTER — Other Ambulatory Visit: Payer: Self-pay | Admitting: *Deleted

## 2024-02-18 DIAGNOSIS — I351 Nonrheumatic aortic (valve) insufficiency: Secondary | ICD-10-CM

## 2024-02-25 ENCOUNTER — Other Ambulatory Visit (HOSPITAL_COMMUNITY): Payer: Self-pay | Admitting: Physician Assistant

## 2024-04-07 ENCOUNTER — Other Ambulatory Visit (HOSPITAL_COMMUNITY): Payer: Self-pay | Admitting: Physician Assistant

## 2024-04-13 DIAGNOSIS — I5032 Chronic diastolic (congestive) heart failure: Secondary | ICD-10-CM | POA: Diagnosis not present

## 2024-04-13 DIAGNOSIS — I10 Essential (primary) hypertension: Secondary | ICD-10-CM | POA: Diagnosis not present

## 2024-04-13 DIAGNOSIS — Z23 Encounter for immunization: Secondary | ICD-10-CM | POA: Diagnosis not present

## 2024-04-13 DIAGNOSIS — M810 Age-related osteoporosis without current pathological fracture: Secondary | ICD-10-CM | POA: Diagnosis not present

## 2024-04-13 DIAGNOSIS — I491 Atrial premature depolarization: Secondary | ICD-10-CM | POA: Diagnosis not present

## 2024-04-13 DIAGNOSIS — Z853 Personal history of malignant neoplasm of breast: Secondary | ICD-10-CM | POA: Diagnosis not present

## 2024-04-13 DIAGNOSIS — I4891 Unspecified atrial fibrillation: Secondary | ICD-10-CM | POA: Diagnosis not present

## 2024-04-13 DIAGNOSIS — Z1331 Encounter for screening for depression: Secondary | ICD-10-CM | POA: Diagnosis not present

## 2024-04-13 DIAGNOSIS — R010 Benign and innocent cardiac murmurs: Secondary | ICD-10-CM | POA: Diagnosis not present

## 2024-04-13 DIAGNOSIS — Z0000 Encounter for general adult medical examination without abnormal findings: Secondary | ICD-10-CM | POA: Diagnosis not present

## 2024-04-30 ENCOUNTER — Other Ambulatory Visit (HOSPITAL_COMMUNITY): Payer: Self-pay | Admitting: Physician Assistant

## 2024-05-12 DIAGNOSIS — D751 Secondary polycythemia: Secondary | ICD-10-CM | POA: Diagnosis not present

## 2024-05-12 DIAGNOSIS — Z23 Encounter for immunization: Secondary | ICD-10-CM | POA: Diagnosis not present

## 2024-05-20 ENCOUNTER — Other Ambulatory Visit: Payer: Self-pay

## 2024-05-20 MED ORDER — METOPROLOL SUCCINATE ER 100 MG PO TB24: 150.0000 mg | ORAL_TABLET | Freq: Every day | ORAL | 1 refills | Status: DC

## 2024-05-31 NOTE — Progress Notes (Unsigned)
 Cardiology Office Note:  .   Date:  05/31/2024  ID:  Addie Holstein, DOB [birth year], MRN [id number] PCP: Faustina Hood, MD  [facility] Providers Cardiologist:  Alexandria Angel, MD Electrophysiologist:  Boyce Byes, MD {  History of Present Illness: .   Sarah Lewis is a 88 y.o. female w/PMHx of  breast cancer (b/l mastectomy/chemo),  coronary Ca++ by CT, HTN,  p.HTN, bronchiectasis,  AFib   She saw Dr. Marven Slimmer 06/07/23, loop removed given patient anxiety about having the device, monitoring, etc.  QTc stable  I saw her Oct 2024, stable QTc, low BP, amlodipine  stopped  She saw the AFib clinic 12/05/23, no symptoms of AFib, stable QTc  Today's visit is scheduled as 6 mo mo Tikosyn  visit ROS:   She is doing quite well Reports that if she didn't have doctor appts she wouldn't know anything was wrong! She continues to live independently cares for her ome, does all the cleaning etc, some dys she is more tired then others No CP, palpitations or cardiac awareness No SOB Mild DOE with heavier chores No near syncope or syncope, no dizziness No bleeding or signs of bleeding  She was struggling with loose stools a few months back and her PMD advised she stop her magnesium  > BMs did normalize  She would like to get off some meds of she could > she is due to see Dr. Audery Blazing, will defer her other meds to him/his team  Device information MDT linq implanted 03/25/23: as part of the Alleviate trial >> more anxiety provoking for the patient and requested removal >> extracted 06/07/23  Arrhythmia/AAD hx Tikosyn  started march 2024  Studies Reviewed: Aaron Aas    EKG done today and reviewed by myself:  SR 60bpm, QTc , LAD Unusual P axis, unchanged   03/05/23: TEE/DCCV Impression: No LAA thrombus Severe biatrial enlargement Mild MR, moderate TR, mild AI LVEF 60-65% Successful DCCV with a single 150J biphasic shock to NSR.     03/03/23; TTE 1. Left ventricular  ejection fraction, by estimation, is 55 to 60%. The  left ventricle has normal function. The left ventricle has no regional  wall motion abnormalities. Left ventricular diastolic parameters are  indeterminate.   2. Right ventricular systolic function is normal. The right ventricular  size is normal. There is mildly elevated pulmonary artery systolic  pressure. The estimated right ventricular systolic pressure is 41.2 mmHg.   3. Left atrial size was severely dilated.   4. Right atrial size was severely dilated.   5. Mild mitral valve regurgitation.   6. Tricuspid valve regurgitation is moderate.   7. The aortic valve was not well visualized. There is moderate  calcification of the aortic valve. Aortic valve regurgitation is moderate.   8. The inferior vena cava is normal in size with <50% respiratory  variability, suggesting right atrial pressure of 8 mmHg.    04/30/22: TTE 1. Left ventricular ejection fraction, by estimation, is 55 to 60%. The  left ventricle has normal function. The left ventricle has no regional  wall motion abnormalities. Left ventricular diastolic parameters are  consistent with Grade II diastolic  dysfunction (pseudonormalization).   2. Right ventricular systolic function is normal. The right ventricular  size is moderately enlarged. There is mildly elevated pulmonary artery  systolic pressure. The estimated right ventricular systolic pressure is  41.4 mmHg.   3. Left atrial size was mildly dilated.   4. Right atrial size was mildly dilated.   5. The  mitral valve is grossly normal. Mild mitral valve regurgitation.  No evidence of mitral stenosis.   6. The tricuspid valve is abnormal. Tricuspid valve regurgitation is  moderate to severe.   7. The aortic valve is calcified. Aortic valve regurgitation is moderate.   8. The inferior vena cava is normal in size with greater than 50%  respiratory variability, suggesting right atrial pressure of 3 mmHg.    Comparison(s): Changes from prior study are noted. EF unchanged. AI is now  moderate. TR still moderate to severe.    Risk Assessment/Calculations:    Physical Exam:   VS:  There were no vitals taken for this visit.   Wt Readings from Last 3 Encounters:  12/05/23 85 lb 6.4 oz (38.7 kg)  10/03/23 83 lb (37.6 kg)  09/23/23 83 lb 3.2 oz (37.7 kg)    GEN: Well nourished, well developed in no acute distress NECK: No JVD; No carotid bruits CARDIAC:  RRR, no murmurs, rubs, gallops RESPIRATORY:  CTA b/l without rales, wheezing or rhonchi  ABDOMEN: Soft, non-tender, non-distended EXTREMITIES:  No edema; No deformity   ASSESSMENT AND PLAN: .    Persistent  AFib CHA2DS2Vasc is 3, on eliquis , appropriately dosed Tikosyn  w/*** stable QTc teaching re-enforced, discussed importance of stable mag/K+ levels med list reviewed Labs today   Secondary hypercoagulable state 2/2 AFib  3. HTN Looks great    Dispo: back in 72mo, sooner if needed  Signed, Debbie Fails, PA-C

## 2024-06-01 DIAGNOSIS — H02055 Trichiasis without entropian left lower eyelid: Secondary | ICD-10-CM | POA: Diagnosis not present

## 2024-06-01 DIAGNOSIS — H1131 Conjunctival hemorrhage, right eye: Secondary | ICD-10-CM | POA: Diagnosis not present

## 2024-06-02 ENCOUNTER — Encounter: Payer: Self-pay | Admitting: Physician Assistant

## 2024-06-02 ENCOUNTER — Ambulatory Visit: Payer: Medicare PPO | Attending: Physician Assistant | Admitting: Physician Assistant

## 2024-06-02 VITALS — BP 114/68 | HR 60 | Ht 59.0 in | Wt 84.4 lb

## 2024-06-02 DIAGNOSIS — I4819 Other persistent atrial fibrillation: Secondary | ICD-10-CM

## 2024-06-02 DIAGNOSIS — Z5181 Encounter for therapeutic drug level monitoring: Secondary | ICD-10-CM | POA: Diagnosis not present

## 2024-06-02 DIAGNOSIS — Z79899 Other long term (current) drug therapy: Secondary | ICD-10-CM

## 2024-06-02 DIAGNOSIS — I10 Essential (primary) hypertension: Secondary | ICD-10-CM

## 2024-06-02 DIAGNOSIS — D6869 Other thrombophilia: Secondary | ICD-10-CM | POA: Diagnosis not present

## 2024-06-02 NOTE — Patient Instructions (Signed)
 Medication Instructions:   Your physician recommends that you continue on your current medications as directed. Please refer to the Current Medication list given to you today.   *If you need a refill on your cardiac medications before your next appointment, please call your pharmacy*   Lab Work:  PLEASE GO DOWN STAIRS  [facility]  FIRST FLOOR   ( GET OFF ELEVATORS WALK TOWARDS WAITING AREA LAB LOCATED BY PHARMACY): BMET MAG ND CBC TODAY    If you have labs (blood work) drawn today and your tests are completely normal, you will receive your results only by: MyChart Message (if you have MyChart) OR A paper copy in the mail If you have any lab test that is abnormal or we need to change your treatment, we will call you to review the results.   Testing/Procedures:  NONE ORDERED  TODAY     Follow-Up: At [facility], you and your health needs are our priority.  As part of our continuing mission to provide you with exceptional heart care, our providers are all part of one team.  This team includes your primary Cardiologist (physician) and Advanced Practice Providers or APPs (Physician Assistants and Nurse Practitioners) who all work together to provide you with the care you need, when you need it.  Your next appointment:   DR Audery Blazing /APP IN NOVEMBER ( RECALL)    6 month(s)  Provider:   You may see Boyce Byes, MD or one of the following Advanced Practice Providers on your designated Care Team:   Mertha Abrahams, [place]   We recommend signing up for the patient portal called MyChart.  Sign up information is provided on this After Visit Summary.  MyChart is used to connect with patients for Virtual Visits (Telemedicine).  Patients are able to view lab/test results, encounter notes, upcoming appointments, etc.  Non-urgent messages can be sent to your provider as well.   To learn more about what you can do with MyChart, go to [email or web address].   Other Instructions

## 2024-06-03 ENCOUNTER — Ambulatory Visit: Payer: Self-pay | Admitting: Physician Assistant

## 2024-06-03 DIAGNOSIS — M810 Age-related osteoporosis without current pathological fracture: Secondary | ICD-10-CM | POA: Diagnosis not present

## 2024-06-03 DIAGNOSIS — E559 Vitamin D deficiency, unspecified: Secondary | ICD-10-CM | POA: Diagnosis not present

## 2024-06-03 LAB — CBC
Hematocrit: 47 % — ABNORMAL HIGH (ref 34.0–46.6)
Hemoglobin: 15.6 g/dL (ref 11.1–15.9)
MCH: 31.3 pg (ref 26.6–33.0)
MCHC: 33.2 g/dL (ref 31.5–35.7)
MCV: 94 fL (ref 79–97)
Platelets: 245 10*3/uL (ref 150–450)
RBC: 4.99 x10E6/uL (ref 3.77–5.28)
RDW: 12.2 % (ref 11.7–15.4)
WBC: 6.2 10*3/uL (ref 3.4–10.8)

## 2024-06-03 LAB — BASIC METABOLIC PANEL WITH GFR
BUN/Creatinine Ratio: 24 (ref 12–28)
BUN: 21 mg/dL (ref 8–27)
CO2: 28 mmol/L (ref 20–29)
Calcium: 10.6 mg/dL — ABNORMAL HIGH (ref 8.7–10.3)
Chloride: 94 mmol/L — ABNORMAL LOW (ref 96–106)
Creatinine, Ser: 0.89 mg/dL (ref 0.57–1.00)
Glucose: 54 mg/dL — ABNORMAL LOW (ref 70–99)
Potassium: 3.8 mmol/L (ref 3.5–5.2)
Sodium: 142 mmol/L (ref 134–144)
eGFR: 63 mL/min/{1.73_m2} (ref >59)

## 2024-06-03 LAB — MAGNESIUM: Magnesium: 2.2 mg/dL (ref 1.6–2.3)

## 2024-06-08 NOTE — Telephone Encounter (Signed)
 Follow Up:     Patient is retuning a call from Friday(06-05-24), concerning her results.

## 2024-06-08 NOTE — Telephone Encounter (Signed)
 Patient is returning call to discuss labs.

## 2024-06-09 ENCOUNTER — Telehealth: Payer: Self-pay | Admitting: Physician Assistant

## 2024-06-09 ENCOUNTER — Other Ambulatory Visit: Payer: Self-pay

## 2024-06-09 DIAGNOSIS — Z79899 Other long term (current) drug therapy: Secondary | ICD-10-CM

## 2024-06-09 DIAGNOSIS — E876 Hypokalemia: Secondary | ICD-10-CM

## 2024-06-09 DIAGNOSIS — I351 Nonrheumatic aortic (valve) insufficiency: Secondary | ICD-10-CM

## 2024-06-09 NOTE — Telephone Encounter (Signed)
 Sarah Charlies Helling, PA-C to Sarah Lewis, [facility]     06/03/24 10:17 AM Result Note Potassium is wnl, though slightly lower then we like for Tikosyn  I don't think we need to make any changes (continue same K+ dose) but would like a repeat potassium level in 2-3 weeks please   Labs otherwise look ok!   The patient has been notified of the result and verbalized understanding.  All questions (if any) were answered. Sharlet Drena Martinis, RN 06/09/2024 5:30 PM

## 2024-06-09 NOTE — Telephone Encounter (Signed)
Patient returned call for her lab results. °

## 2024-06-09 NOTE — Telephone Encounter (Signed)
 Pt calling back

## 2024-06-10 DIAGNOSIS — M810 Age-related osteoporosis without current pathological fracture: Secondary | ICD-10-CM | POA: Diagnosis not present

## 2024-06-15 DIAGNOSIS — Z961 Presence of intraocular lens: Secondary | ICD-10-CM | POA: Diagnosis not present

## 2024-06-15 DIAGNOSIS — H26493 Other secondary cataract, bilateral: Secondary | ICD-10-CM | POA: Diagnosis not present

## 2024-07-14 DIAGNOSIS — R0789 Other chest pain: Secondary | ICD-10-CM | POA: Diagnosis not present

## 2024-07-14 DIAGNOSIS — R200 Anesthesia of skin: Secondary | ICD-10-CM | POA: Diagnosis not present

## 2024-07-14 DIAGNOSIS — Z9181 History of falling: Secondary | ICD-10-CM | POA: Diagnosis not present

## 2024-07-14 DIAGNOSIS — S20219A Contusion of unspecified front wall of thorax, initial encounter: Secondary | ICD-10-CM | POA: Diagnosis not present

## 2024-07-16 ENCOUNTER — Other Ambulatory Visit (HOSPITAL_COMMUNITY): Payer: Self-pay | Admitting: Cardiology

## 2024-08-04 DIAGNOSIS — R200 Anesthesia of skin: Secondary | ICD-10-CM | POA: Diagnosis not present

## 2024-08-04 DIAGNOSIS — M79641 Pain in right hand: Secondary | ICD-10-CM | POA: Diagnosis not present

## 2024-08-11 DIAGNOSIS — L570 Actinic keratosis: Secondary | ICD-10-CM | POA: Diagnosis not present

## 2024-08-11 DIAGNOSIS — L821 Other seborrheic keratosis: Secondary | ICD-10-CM | POA: Diagnosis not present

## 2024-08-11 DIAGNOSIS — X32XXXD Exposure to sunlight, subsequent encounter: Secondary | ICD-10-CM | POA: Diagnosis not present

## 2024-09-20 ENCOUNTER — Other Ambulatory Visit (HOSPITAL_COMMUNITY): Payer: Self-pay | Admitting: Physician Assistant

## 2024-10-12 DIAGNOSIS — M810 Age-related osteoporosis without current pathological fracture: Secondary | ICD-10-CM | POA: Diagnosis not present

## 2024-10-12 DIAGNOSIS — I4891 Unspecified atrial fibrillation: Secondary | ICD-10-CM | POA: Diagnosis not present

## 2024-10-12 DIAGNOSIS — Z23 Encounter for immunization: Secondary | ICD-10-CM | POA: Diagnosis not present

## 2024-10-12 DIAGNOSIS — I10 Essential (primary) hypertension: Secondary | ICD-10-CM | POA: Diagnosis not present

## 2024-10-24 ENCOUNTER — Other Ambulatory Visit (HOSPITAL_COMMUNITY): Payer: Self-pay | Admitting: Physician Assistant

## 2024-11-19 ENCOUNTER — Telehealth: Payer: Self-pay | Admitting: Cardiology

## 2024-11-23 ENCOUNTER — Telehealth: Payer: Self-pay | Admitting: Pulmonary Disease

## 2024-11-23 NOTE — Telephone Encounter (Signed)
 Patient is out of medication

## 2024-11-23 NOTE — Telephone Encounter (Signed)
 Refill sent

## 2024-11-24 NOTE — Telephone Encounter (Signed)
 Copied from CRM #[id number]. Topic: Clinical - Medication Refill >> Nov 23, 2024 12:49 PM Leila BROCKS wrote: Patient [phone] is aware Dr. Brenna ronco 04/01/23 is longer at this practice, but still wants to have ipratropium (ATROVENT ) 0.02 % nebulizer solution refilled. Patient has five days left of medication. Scheduled [name] 11/26/24 at 9 a,m. Please advise and call back.   [id number] - [place], [place] - [address] AT [address] OF [address] [place] [address] Phone:[phone]

## 2024-11-26 ENCOUNTER — Encounter: Payer: Self-pay | Admitting: Pulmonary Disease

## 2024-11-26 ENCOUNTER — Ambulatory Visit

## 2024-11-26 ENCOUNTER — Ambulatory Visit: Admitting: Pulmonary Disease

## 2024-11-26 VITALS — BP 130/78 | HR 60 | Wt 82.2 lb

## 2024-11-26 DIAGNOSIS — R062 Wheezing: Secondary | ICD-10-CM

## 2024-11-26 DIAGNOSIS — J90 Pleural effusion, not elsewhere classified: Secondary | ICD-10-CM

## 2024-11-26 DIAGNOSIS — J479 Bronchiectasis, uncomplicated: Secondary | ICD-10-CM | POA: Diagnosis not present

## 2024-11-26 MED ORDER — ALBUTEROL SULFATE (2.5 MG/3ML) 0.083% IN NEBU: 2.5000 mg | INHALATION_SOLUTION | Freq: Four times a day (QID) | RESPIRATORY_TRACT | 12 refills | Status: AC | PRN

## 2024-11-26 NOTE — Progress Notes (Signed)
 Established Patient Pulmonology Office Visit   Subjective:  Patient ID: Sarah Lewis, female    DOB: 1936-01-28  MRN: [id number]  CC:  Chief Complaint  Patient presents with   Medical Management of Chronic Issues    Pt states no concerns just refill INH     Discussed the use of AI scribe software for clinical note transcription with the patient, who gave verbal consent to proceed.  History of Present Illness Sarah Lewis is an 88 year old female with bronchiectasis who presents for follow-up.  She uses an albuterol  nebulizer once daily after breakfast and has done so for several years. She uses a flutter valve as needed for chest congestion.  Since starting albuterol , she has had no cough or mucus problems.  She has occasional shortness of breath, which she associates with heart problems over the past few years. She was hospitalized for heart issues last year or the year before.  She recalls prior chest x-rays and CT scans showing fluid around the lung, and she has not received any recent updates about this.        ROS   Current Medications[1]      Objective:  BP 130/78   Pulse 60   Wt 82 lb 3.2 oz (37.3 kg)   SpO2 93%   BMI 16.60 kg/m     Physical Exam Constitutional:      General: She is not in acute distress.    Appearance: Normal appearance.  Eyes:     General: No scleral icterus.    Conjunctiva/sclera: Conjunctivae normal.  Cardiovascular:     Rate and Rhythm: Normal rate and regular rhythm.  Pulmonary:     Breath sounds: No wheezing, rhonchi or rales.  Musculoskeletal:     Right lower leg: No edema.     Left lower leg: No edema.  Skin:    General: Skin is warm and dry.  Neurological:     General: No focal deficit present.      Diagnostic Review:    CTA Chest 03/02/23 Mediastinum/Nodes: No enlarged mediastinal, hilar, or axillary lymph nodes. Thyroid gland, trachea, and esophagus demonstrate no significant findings. Bilateral  breast implants are noted.   Lungs/Pleura: There is a moderate right and small left pleural effusion with associated atelectasis. There is mild atelectasis/scarring in the right middle lobe and the lingula. There is no pneumothorax.     Assessment & Plan:   Assessment & Plan Bronchiectasis without complication (HCC)  Orders:   albuterol  (PROVENTIL ) (2.5 MG/3ML) 0.083% nebulizer solution; Take 3 mLs (2.5 mg total) by nebulization every 6 (six) hours as needed for wheezing or shortness of breath.  Pleural effusion, bilateral  Orders:   DG Chest 2 View; Future  Wheezing  Orders:   albuterol  (PROVENTIL ) (2.5 MG/3ML) 0.083% nebulizer solution; Take 3 mLs (2.5 mg total) by nebulization every 6 (six) hours as needed for wheezing or shortness of breath.   Assessment and Plan Assessment & Plan Bronchiectasis Well-controlled with albuterol  inhaler. Occasional dyspnea likely cardiac-related. - Refilled albuterol  inhaler prescription. - Continue albuterol  inhaler once daily. - Yearly follow-up unless symptoms change.  Pleural effusion Previous imaging showed pleural effusion, possibly cardiac-related. No current symptoms. - Ordered chest x-ray to assess current status.      Return in about 1 year (around 11/26/2025) for f/u visit [name].   Dorn KATHEE Kara, MD     [1]  Current Outpatient Medications:    apixaban  (ELIQUIS ) 2.5 MG TABS tablet, Take 1  tablet (2.5 mg total) by mouth 2 (two) times daily., Disp: 60 tablet, Rfl: 2   Calcium Carb-Cholecalciferol (CALCIUM-VITAMIN D) 500-400 MG-UNIT TABS, Take 1 tablet by mouth 2 (two) times daily., Disp: , Rfl:    Cholecalciferol (VITAMIN D-3) 25 MCG (1000 UT) CAPS, Take 1 capsule by mouth daily., Disp: , Rfl:    denosumab  (PROLIA ) 60 MG/ML SOSY injection, Inject 60 mg into the skin every 6 (six) months., Disp: , Rfl:    dofetilide  (TIKOSYN ) 125 MCG capsule, TAKE 1 CAPSULE BY MOUTH TWICE A DAY, Disp: 180 capsule, Rfl: 2    furosemide  (LASIX ) 20 MG tablet, TAKE 1 TABLET BY MOUTH EVERY DAY, Disp: 90 tablet, Rfl: 3   JARDIANCE  10 MG TABS tablet, Take 10 mg by mouth daily., Disp: , Rfl:    magnesium  oxide (MAG-OX) 400 (240 Mg) MG tablet, TAKE 1 TABLET BY MOUTH EVERY DAY, Disp: 90 tablet, Rfl: 1   metoprolol  succinate (TOPROL -XL) 100 MG 24 hr tablet, TAKE 1.5 TABLETS BY MOUTH DAILY WITH OR IMMEDIATELY FOLLOWING A MEAL, Disp: 135 tablet, Rfl: 1   potassium chloride  (KLOR-CON ) 10 MEQ tablet, TAKE 1 TABLET BY MOUTH EVERY DAY, Disp: 90 tablet, Rfl: 3   albuterol  (PROVENTIL ) (2.5 MG/3ML) 0.083% nebulizer solution, Take 3 mLs (2.5 mg total) by nebulization every 6 (six) hours as needed for wheezing or shortness of breath., Disp: 75 mL, Rfl: 12

## 2024-11-26 NOTE — Patient Instructions (Signed)
 Continue albuterol  nebulizer treatment daily  We will check chest x-ray today to follow up on pleural effusions - will call you with results  Follow up in 1 year, call sooner if needed

## 2024-12-19 NOTE — Progress Notes (Unsigned)
" °  Electrophysiology Office Follow up Visit Note:    Date:  12/21/2024   ID:  Sarah Lewis, DOB July 16, 1936, MRN [id number]  PCP:  Sarah Pellet, MD  [facility], [facility] Cardiologist:  Sarah Shallow, MD  [facility] Electrophysiologist:  Sarah [name], MD    Interval History:     Sarah Lewis is a 89 y.o. female who presents for a follow up visit.   The patient last saw Sarah Lewis 06/02/2024. She has a history of breast CA, pulmHTN, HTN, AF She had a loop recroder but this was removed due to patient anxiety. On dofetilide  and eliquis . She is doing well today.  She reports increased stress recently.  Her husband had a stroke.  She is caring for him at home and they are waiting for a bed to become available at a memory care unit locally.  She reports good control of her arrhythmia.  She has excellent medication compliance.       Past medical, surgical, social and family history were reviewed.  ROS:   Please see the history of present illness.    All other systems reviewed and are negative.  EKGs/Labs/Other Studies Reviewed:    The following studies were reviewed today:     EKG Interpretation Date/Time:  Monday December 21 2024 11:33:41 EST Ventricular Rate:  54 PR Interval:  160 QRS Duration:  96 QT Interval:  442 QTC Calculation: 419 R Axis:   -72  Text Interpretation: Sinus bradycardia with Premature supraventricular complexes Left axis deviation Pulmonary disease pattern Incomplete right bundle branch block Confirmed by Lewis Sarah [phone]) on 12/21/2024 11:43:22 AM    Physical Exam:    VS:  BP 134/60 (BP Location: Left Arm, Patient Position: Sitting, Cuff Size: Normal)   Pulse (!) 54   Ht 4' 11 (1.499 m)   Wt 84 lb (38.1 kg)   BMI 16.97 kg/m     Wt Readings from Last 3 Encounters:  12/21/24 84 lb (38.1 kg)  11/26/24 82 lb 3.2 oz (37.3 kg)  06/02/24 84 lb 6.4 oz (38.3 kg)     GEN: no distress CARD: RRR, No MRG RESP: No IWOB. CTAB.       ASSESSMENT:    1. Persistent atrial fibrillation (HCC)   2. Encounter for long-term (current) use of high-risk medication    PLAN:    In order of problems listed above:  #Persistent AF #High risk medication monitoring - dofetilide  Maintaining sinus rhythm QTc acceptable for ongoing dofetilide  use  Continue eliquis  Update BMP and Mag today  I discussed my upcoming departure from [name] during today's clinic appointment.  The patient will continue to follow-up with one of my EP partners moving forward.  Follow up 4 months w EP APP.    Signed, Sarah Holts, MD, [facility], [facility] 12/21/2024 11:43 AM    Electrophysiology [facility] "

## 2024-12-21 ENCOUNTER — Ambulatory Visit: Attending: Cardiology | Admitting: Cardiology

## 2024-12-21 ENCOUNTER — Encounter: Payer: Self-pay | Admitting: Cardiology

## 2024-12-21 ENCOUNTER — Other Ambulatory Visit: Payer: Self-pay

## 2024-12-21 VITALS — BP 134/60 | HR 54 | Ht 59.0 in | Wt 84.0 lb

## 2024-12-21 DIAGNOSIS — I4819 Other persistent atrial fibrillation: Secondary | ICD-10-CM

## 2024-12-21 DIAGNOSIS — Z79899 Other long term (current) drug therapy: Secondary | ICD-10-CM | POA: Diagnosis not present

## 2024-12-21 NOTE — Patient Instructions (Signed)
 Medication Instructions:  Your physician recommends that you continue on your current medications as directed. Please refer to the Current Medication list given to you today.  *If you need a refill on your cardiac medications before your next appointment, please call your pharmacy*  Lab Work: TODAY: BMET and Mag  Follow-Up: At [facility], you and your health needs are our priority.  As part of our continuing mission to provide you with exceptional heart care, our providers are all part of one team.  This team includes your primary Cardiologist (physician) and Advanced Practice Providers or APPs (Physician Assistants and Nurse Practitioners) who all work together to provide you with the care you need, when you need it.  Your next appointment:   4 months  Provider:   You will see one of the following Advanced Practice Providers on your designated Care Team:   Charlies Arthur, [place] Ozell Jodie Passey, PA-C Suzann Riddle, NP Daphne Barrack, NP Artist Pouch, PA-C

## 2024-12-21 NOTE — Addendum Note (Signed)
 Addended by: MAGDALINE NEEDLE on: 12/21/2024 11:50 AM   Modules accepted: Orders

## 2024-12-23 LAB — BASIC METABOLIC PANEL WITH GFR
BUN/Creatinine Ratio: 17 (ref 12–28)
BUN: 17 mg/dL (ref 8–27)
CO2: 26 mmol/L (ref 20–29)
Calcium: 9.3 mg/dL (ref 8.7–10.3)
Chloride: 97 mmol/L (ref 96–106)
Creatinine, Ser: 0.98 mg/dL (ref 0.57–1.00)
Glucose: 67 mg/dL — ABNORMAL LOW (ref 70–99)
Potassium: 3.6 mmol/L (ref 3.5–5.2)
Sodium: 141 mmol/L (ref 134–144)
eGFR: 56 mL/min/1.73 — ABNORMAL LOW

## 2024-12-23 LAB — MAGNESIUM: Magnesium: 2 mg/dL (ref 1.6–2.3)

## 2024-12-26 ENCOUNTER — Ambulatory Visit: Payer: Self-pay | Admitting: Cardiology

## 2025-01-11 ENCOUNTER — Ambulatory Visit (HOSPITAL_COMMUNITY)

## 2025-01-20 ENCOUNTER — Other Ambulatory Visit: Payer: Self-pay | Admitting: *Deleted

## 2025-01-20 DIAGNOSIS — I351 Nonrheumatic aortic (valve) insufficiency: Secondary | ICD-10-CM

## 2025-01-22 ENCOUNTER — Ambulatory Visit (HOSPITAL_COMMUNITY)

## 2025-01-26 ENCOUNTER — Ambulatory Visit: Admitting: Cardiology

## 2025-03-01 ENCOUNTER — Ambulatory Visit (HOSPITAL_COMMUNITY)

## 2025-04-20 ENCOUNTER — Ambulatory Visit: Admitting: Student
